# Patient Record
Sex: Male | Born: 1935 | Race: White | Hispanic: No | Marital: Married | Smoking: Never smoker
Health system: Southern US, Community
[De-identification: ages and names within clinical notes are randomized; demographics above are authoritative.]

## PROBLEM LIST (undated history)

## (undated) DIAGNOSIS — H9193 Unspecified hearing loss, bilateral: Secondary | ICD-10-CM

## (undated) DIAGNOSIS — T7840XA Allergy, unspecified, initial encounter: Secondary | ICD-10-CM

## (undated) DIAGNOSIS — I252 Old myocardial infarction: Secondary | ICD-10-CM

## (undated) DIAGNOSIS — N400 Enlarged prostate without lower urinary tract symptoms: Secondary | ICD-10-CM

## (undated) DIAGNOSIS — E785 Hyperlipidemia, unspecified: Secondary | ICD-10-CM

## (undated) DIAGNOSIS — I10 Essential (primary) hypertension: Secondary | ICD-10-CM

## (undated) DIAGNOSIS — J84112 Idiopathic pulmonary fibrosis: Secondary | ICD-10-CM

## (undated) DIAGNOSIS — H7192 Unspecified cholesteatoma, left ear: Secondary | ICD-10-CM

## (undated) DIAGNOSIS — N529 Male erectile dysfunction, unspecified: Secondary | ICD-10-CM

## (undated) DIAGNOSIS — I2510 Atherosclerotic heart disease of native coronary artery without angina pectoris: Secondary | ICD-10-CM

## (undated) HISTORY — DX: Benign prostatic hyperplasia without lower urinary tract symptoms: N40.0

## (undated) HISTORY — DX: Allergy, unspecified, initial encounter: T78.40XA

## (undated) HISTORY — DX: Essential (primary) hypertension: I10

## (undated) HISTORY — DX: Male erectile dysfunction, unspecified: N52.9

## (undated) HISTORY — DX: Unspecified hearing loss, bilateral: H91.93

## (undated) HISTORY — DX: Idiopathic pulmonary fibrosis: J84.112

## (undated) HISTORY — DX: Atherosclerotic heart disease of native coronary artery without angina pectoris: I25.10

## (undated) HISTORY — DX: Old myocardial infarction: I25.2

## (undated) HISTORY — DX: Unspecified cholesteatoma, left ear: H71.92

## (undated) HISTORY — PX: CLEFT PALATE REPAIR: SUR1165

## (undated) HISTORY — DX: Hyperlipidemia, unspecified: E78.5

---

## 2005-02-14 ENCOUNTER — Ambulatory Visit: Payer: Self-pay | Admitting: Family Medicine

## 2005-02-21 ENCOUNTER — Ambulatory Visit: Payer: Self-pay | Admitting: Family Medicine

## 2005-07-25 DIAGNOSIS — I252 Old myocardial infarction: Secondary | ICD-10-CM

## 2005-07-25 HISTORY — DX: Old myocardial infarction: I25.2

## 2006-01-22 HISTORY — PX: CORONARY ARTERY BYPASS GRAFT: SHX141

## 2006-02-15 ENCOUNTER — Ambulatory Visit: Payer: Self-pay | Admitting: Family Medicine

## 2006-02-15 ENCOUNTER — Encounter: Payer: Self-pay | Admitting: Vascular Surgery

## 2006-02-15 ENCOUNTER — Ambulatory Visit: Payer: Self-pay | Admitting: Internal Medicine

## 2006-02-15 ENCOUNTER — Inpatient Hospital Stay (HOSPITAL_COMMUNITY): Admission: EM | Admit: 2006-02-15 | Discharge: 2006-02-21 | Payer: Self-pay | Admitting: Emergency Medicine

## 2006-03-08 ENCOUNTER — Ambulatory Visit: Payer: Self-pay | Admitting: Internal Medicine

## 2006-03-08 ENCOUNTER — Ambulatory Visit: Payer: Self-pay

## 2006-03-15 ENCOUNTER — Ambulatory Visit: Payer: Self-pay | Admitting: Internal Medicine

## 2006-03-16 ENCOUNTER — Encounter (HOSPITAL_COMMUNITY): Admission: RE | Admit: 2006-03-16 | Discharge: 2006-06-14 | Payer: Self-pay | Admitting: Internal Medicine

## 2006-04-10 ENCOUNTER — Ambulatory Visit: Payer: Self-pay | Admitting: Family Medicine

## 2006-05-02 ENCOUNTER — Ambulatory Visit: Payer: Self-pay | Admitting: Family Medicine

## 2006-05-16 ENCOUNTER — Ambulatory Visit: Payer: Self-pay | Admitting: Family Medicine

## 2006-05-25 ENCOUNTER — Ambulatory Visit: Payer: Self-pay | Admitting: Internal Medicine

## 2006-06-08 ENCOUNTER — Ambulatory Visit: Payer: Self-pay | Admitting: Internal Medicine

## 2006-06-15 ENCOUNTER — Encounter (HOSPITAL_COMMUNITY): Admission: RE | Admit: 2006-06-15 | Discharge: 2006-06-30 | Payer: Self-pay | Admitting: Cardiology

## 2006-09-26 ENCOUNTER — Ambulatory Visit: Payer: Self-pay | Admitting: Internal Medicine

## 2006-09-26 LAB — CONVERTED CEMR LAB
AST: 19 units/L (ref 0–37)
Alkaline Phosphatase: 60 units/L (ref 39–117)
BUN: 24 mg/dL — ABNORMAL HIGH (ref 6–23)
Bilirubin, Direct: 0.2 mg/dL (ref 0.0–0.3)
CO2: 30 meq/L (ref 19–32)
Cholesterol: 109 mg/dL (ref 0–200)
LDL Cholesterol: 58 mg/dL (ref 0–99)
Total CHOL/HDL Ratio: 3.3
Total Protein: 6.4 g/dL (ref 6.0–8.3)

## 2006-11-01 ENCOUNTER — Ambulatory Visit: Payer: Self-pay | Admitting: Family Medicine

## 2006-11-01 LAB — CONVERTED CEMR LAB: PSA: 3.63 ng/mL (ref 0.10–4.00)

## 2006-12-08 ENCOUNTER — Ambulatory Visit: Payer: Self-pay | Admitting: Internal Medicine

## 2006-12-08 LAB — CONVERTED CEMR LAB
ALT: 66 units/L — ABNORMAL HIGH (ref 0–40)
AST: 40 units/L — ABNORMAL HIGH (ref 0–37)
Albumin: 4 g/dL (ref 3.5–5.2)
Alkaline Phosphatase: 58 units/L (ref 39–117)
BUN: 26 mg/dL — ABNORMAL HIGH (ref 6–23)
Bilirubin, Direct: 0.2 mg/dL (ref 0.0–0.3)
Chloride: 104 meq/L (ref 96–112)
Cholesterol: 101 mg/dL (ref 0–200)
Creatinine, Ser: 1.4 mg/dL (ref 0.4–1.5)
GFR calc non Af Amer: 53 mL/min
HDL: 31.6 mg/dL — ABNORMAL LOW (ref >39.0)
Potassium: 4.3 meq/L (ref 3.5–5.1)
Sodium: 140 meq/L (ref 135–145)
Total Bilirubin: 1.4 mg/dL — ABNORMAL HIGH (ref 0.3–1.2)
Total CHOL/HDL Ratio: 3.2
Triglycerides: 76 mg/dL (ref 0–149)

## 2007-01-08 ENCOUNTER — Ambulatory Visit: Payer: Self-pay | Admitting: Internal Medicine

## 2007-01-08 LAB — CONVERTED CEMR LAB
ALT: 33 units/L (ref 0–40)
AST: 25 units/L (ref 0–37)
Alkaline Phosphatase: 52 units/L (ref 39–117)
Total Protein: 6.2 g/dL (ref 6.0–8.3)

## 2007-02-26 ENCOUNTER — Ambulatory Visit: Payer: Self-pay | Admitting: Internal Medicine

## 2007-04-25 ENCOUNTER — Ambulatory Visit: Payer: Self-pay | Admitting: Internal Medicine

## 2007-04-25 LAB — CONVERTED CEMR LAB
AST: 21 units/L (ref 0–37)
Bilirubin, Direct: 0.2 mg/dL (ref 0.0–0.3)
Total CHOL/HDL Ratio: 3.4
Triglycerides: 63 mg/dL (ref 0–149)
VLDL: 13 mg/dL (ref 0–40)

## 2007-07-31 ENCOUNTER — Ambulatory Visit: Payer: Self-pay | Admitting: Family Medicine

## 2007-07-31 DIAGNOSIS — E782 Mixed hyperlipidemia: Secondary | ICD-10-CM

## 2007-07-31 DIAGNOSIS — J309 Allergic rhinitis, unspecified: Secondary | ICD-10-CM | POA: Insufficient documentation

## 2007-07-31 DIAGNOSIS — I10 Essential (primary) hypertension: Secondary | ICD-10-CM

## 2007-07-31 DIAGNOSIS — N400 Enlarged prostate without lower urinary tract symptoms: Secondary | ICD-10-CM

## 2007-07-31 DIAGNOSIS — I2510 Atherosclerotic heart disease of native coronary artery without angina pectoris: Secondary | ICD-10-CM

## 2007-07-31 DIAGNOSIS — Z951 Presence of aortocoronary bypass graft: Secondary | ICD-10-CM | POA: Insufficient documentation

## 2007-07-31 LAB — CONVERTED CEMR LAB
Alkaline Phosphatase: 63 units/L (ref 39–117)
Basophils Absolute: 0 10*3/uL (ref 0.0–0.1)
Calcium: 9.3 mg/dL (ref 8.4–10.5)
GFR calc Af Amer: 60 mL/min
GFR calc non Af Amer: 49 mL/min
Glucose, Bld: 97 mg/dL (ref 70–99)
HCT: 49 % (ref 39.0–52.0)
Monocytes Absolute: 0.8 10*3/uL — ABNORMAL HIGH (ref 0.2–0.7)
Monocytes Relative: 11.3 % — ABNORMAL HIGH (ref 3.0–11.0)
Neutro Abs: 3.9 10*3/uL (ref 1.4–7.7)
PSA: 4.2 ng/mL — ABNORMAL HIGH (ref 0.10–4.00)
RDW: 13.9 % (ref 11.5–14.6)
Sodium: 137 meq/L (ref 135–145)
TSH: 1.61 microintl units/mL (ref 0.35–5.50)
Total Bilirubin: 1.6 mg/dL — ABNORMAL HIGH (ref 0.3–1.2)
Total Protein: 6.7 g/dL (ref 6.0–8.3)

## 2007-08-02 ENCOUNTER — Telehealth: Payer: Self-pay | Admitting: Family Medicine

## 2007-08-13 ENCOUNTER — Telehealth (INDEPENDENT_AMBULATORY_CARE_PROVIDER_SITE_OTHER): Payer: Self-pay

## 2007-09-25 ENCOUNTER — Ambulatory Visit: Payer: Self-pay | Admitting: Internal Medicine

## 2007-09-25 LAB — CONVERTED CEMR LAB
ALT: 29 units/L (ref 0–53)
Alkaline Phosphatase: 53 units/L (ref 39–117)
Bilirubin, Direct: 0.3 mg/dL (ref 0.0–0.3)
CO2: 30 meq/L (ref 19–32)
Chloride: 103 meq/L (ref 96–112)
Cholesterol: 85 mg/dL (ref 0–200)
Creatinine, Ser: 1.3 mg/dL (ref 0.4–1.5)
Glucose, Bld: 96 mg/dL (ref 70–99)
LDL Cholesterol: 43 mg/dL (ref 0–99)
Potassium: 4.4 meq/L (ref 3.5–5.1)
Sodium: 139 meq/L (ref 135–145)
Total Bilirubin: 1.7 mg/dL — ABNORMAL HIGH (ref 0.3–1.2)
Total CHOL/HDL Ratio: 2.5
Triglycerides: 41 mg/dL (ref 0–149)
VLDL: 8 mg/dL (ref 0–40)

## 2007-10-01 ENCOUNTER — Ambulatory Visit: Payer: Self-pay | Admitting: Cardiology

## 2007-12-25 ENCOUNTER — Ambulatory Visit: Payer: Self-pay | Admitting: Internal Medicine

## 2007-12-25 LAB — CONVERTED CEMR LAB
ALT: 27 units/L (ref 0–53)
AST: 26 units/L (ref 0–37)
Albumin: 3.7 g/dL (ref 3.5–5.2)
CO2: 29 meq/L (ref 19–32)
Calcium: 9.3 mg/dL (ref 8.4–10.5)
Chloride: 102 meq/L (ref 96–112)
GFR calc Af Amer: 64 mL/min
Glucose, Bld: 106 mg/dL — ABNORMAL HIGH (ref 70–99)
Total Bilirubin: 1.9 mg/dL — ABNORMAL HIGH (ref 0.3–1.2)

## 2008-07-28 ENCOUNTER — Ambulatory Visit: Payer: Self-pay | Admitting: Internal Medicine

## 2008-07-31 ENCOUNTER — Ambulatory Visit: Payer: Self-pay | Admitting: Family Medicine

## 2008-07-31 LAB — CONVERTED CEMR LAB
Glucose, Urine, Semiquant: NEGATIVE
Nitrite: NEGATIVE
Specific Gravity, Urine: 1.015
Urobilinogen, UA: 0.2
WBC Urine, dipstick: NEGATIVE
pH: 5

## 2008-08-01 ENCOUNTER — Telehealth: Payer: Self-pay | Admitting: Family Medicine

## 2008-08-13 ENCOUNTER — Ambulatory Visit: Payer: Self-pay | Admitting: Internal Medicine

## 2008-08-13 LAB — CONVERTED CEMR LAB
Calcium: 9.1 mg/dL (ref 8.4–10.5)
Creatinine, Ser: 1.4 mg/dL (ref 0.4–1.5)
GFR calc Af Amer: 64 mL/min

## 2008-12-19 ENCOUNTER — Encounter (INDEPENDENT_AMBULATORY_CARE_PROVIDER_SITE_OTHER): Payer: Self-pay | Admitting: *Deleted

## 2009-01-29 ENCOUNTER — Ambulatory Visit: Payer: Self-pay | Admitting: Family Medicine

## 2009-02-17 ENCOUNTER — Ambulatory Visit: Payer: Self-pay | Admitting: Internal Medicine

## 2009-02-17 DIAGNOSIS — R609 Edema, unspecified: Secondary | ICD-10-CM

## 2009-02-18 ENCOUNTER — Ambulatory Visit: Payer: Self-pay | Admitting: Internal Medicine

## 2009-02-18 DIAGNOSIS — E7800 Pure hypercholesterolemia, unspecified: Secondary | ICD-10-CM

## 2009-02-18 LAB — CONVERTED CEMR LAB
ALT: 21 units/L (ref 0–53)
Alkaline Phosphatase: 61 units/L (ref 39–117)
Bilirubin, Direct: 0.3 mg/dL (ref 0.0–0.3)
Chloride: 105 meq/L (ref 96–112)
Cholesterol: 89 mg/dL (ref 0–200)
HDL: 30.6 mg/dL — ABNORMAL LOW (ref >39.00)
Total Bilirubin: 2 mg/dL — ABNORMAL HIGH (ref 0.3–1.2)
Total CHOL/HDL Ratio: 3

## 2009-02-19 ENCOUNTER — Encounter: Payer: Self-pay | Admitting: Internal Medicine

## 2009-09-10 ENCOUNTER — Ambulatory Visit: Payer: Self-pay | Admitting: Family Medicine

## 2009-09-10 LAB — CONVERTED CEMR LAB
AST: 20 units/L (ref 0–37)
Albumin: 3.8 g/dL (ref 3.5–5.2)
Alkaline Phosphatase: 59 units/L (ref 39–117)
Basophils Relative: 0.1 % (ref 0.0–3.0)
Bilirubin, Direct: 0.2 mg/dL (ref 0.0–0.3)
Blood in Urine, dipstick: NEGATIVE
CO2: 32 meq/L (ref 19–32)
Chloride: 106 meq/L (ref 96–112)
Glucose, Bld: 90 mg/dL (ref 70–99)
Glucose, Urine, Semiquant: NEGATIVE
HDL: 35.2 mg/dL — ABNORMAL LOW (ref >39.00)
Ketones, urine, test strip: NEGATIVE
Lymphs Abs: 1.5 10*3/uL (ref 0.7–4.0)
MCV: 93.5 fL (ref 78.0–100.0)
Neutrophils Relative %: 63.2 % (ref 43.0–77.0)
Nitrite: NEGATIVE
Platelets: 177 10*3/uL (ref 150.0–400.0)
Potassium: 4.9 meq/L (ref 3.5–5.1)
RBC: 4.88 M/uL (ref 4.22–5.81)
RDW: 13.3 % (ref 11.5–14.6)
Sodium: 143 meq/L (ref 135–145)
Total Bilirubin: 2.2 mg/dL — ABNORMAL HIGH (ref 0.3–1.2)
Triglycerides: 147 mg/dL (ref 0.0–149.0)

## 2009-09-14 LAB — CONVERTED CEMR LAB
AST: 25 units/L (ref 0–37)
Alkaline Phosphatase: 63 units/L (ref 39–117)
Basophils Absolute: 0 10*3/uL (ref 0.0–0.1)
Basophils Relative: 0.6 % (ref 0.0–3.0)
Bilirubin, Direct: 0.3 mg/dL (ref 0.0–0.3)
CO2: 31 meq/L (ref 19–32)
Cholesterol: 137 mg/dL (ref 0–200)
Eosinophils Absolute: 0.3 10*3/uL (ref 0.0–0.7)
GFR calc Af Amer: 55 mL/min
GFR calc non Af Amer: 46 mL/min
Glucose, Bld: 100 mg/dL — ABNORMAL HIGH (ref 70–99)
HCT: 47.1 % (ref 39.0–52.0)
HDL: 34.4 mg/dL — ABNORMAL LOW (ref >39.0)
LDL Cholesterol: 83 mg/dL (ref 0–99)
MCV: 92.2 fL (ref 78.0–100.0)
Monocytes Relative: 9.8 % (ref 3.0–12.0)
Neutrophils Relative %: 62.9 % (ref 43.0–77.0)
Platelets: 198 10*3/uL (ref 150–400)
Potassium: 4.4 meq/L (ref 3.5–5.1)
RBC: 5.1 M/uL (ref 4.22–5.81)
RDW: 12.7 % (ref 11.5–14.6)
Sodium: 142 meq/L (ref 135–145)
Total Protein: 7.1 g/dL (ref 6.0–8.3)
Triglycerides: 99 mg/dL (ref 0–149)

## 2009-10-27 ENCOUNTER — Ambulatory Visit: Payer: Self-pay | Admitting: Internal Medicine

## 2010-07-29 ENCOUNTER — Ambulatory Visit
Admission: RE | Admit: 2010-07-29 | Discharge: 2010-07-29 | Payer: Self-pay | Source: Home / Self Care | Attending: Internal Medicine | Admitting: Internal Medicine

## 2010-07-29 ENCOUNTER — Encounter: Payer: Self-pay | Admitting: Internal Medicine

## 2010-07-29 DIAGNOSIS — M79609 Pain in unspecified limb: Secondary | ICD-10-CM | POA: Insufficient documentation

## 2010-08-16 ENCOUNTER — Ambulatory Visit
Admission: RE | Admit: 2010-08-16 | Discharge: 2010-08-16 | Payer: Self-pay | Source: Home / Self Care | Attending: Internal Medicine | Admitting: Internal Medicine

## 2010-08-25 NOTE — Assessment & Plan Note (Signed)
Summary: f106m      Allergies Added: NKDA  Visit Type:  Follow-up Primary Provider:  Roderick Pee MD  CC:  no complaints.  History of Present Illness: Ian Hale is a delightful 76 year old male with history of coronary artery disease, status post non-ST-elevation myocardial infarction in July 2007, followed by bypass surgery.  He also has a history of hypertension, hyperlipidemia with low HDL, and history of cleft palate status post repair.  He returns today for routine followup.   He is doing very well. Active without chest pain or SOB. Walking almost every day for 30-35 mins/day. LE edema much improved with addition of  No orthopnea or PND. Has lost 17 pounds trough diet and exercise.  Now back up to 2000 mg per day of Niacin. Most recent cholesterol panel in January LDL 60 HDL 35.   Current Medications (verified): 1)  Potassium Chloride 20 Meq  Pack (Potassium Chloride) .... Once Daily 2)  Hydrochlorothiazide 25 Mg  Tabs (Hydrochlorothiazide) .... Take 1 Tablet By Mouth Every Morning ; Cpx or Ov When Due 3)  Lipitor 80 Mg  Tabs (Atorvastatin Calcium) .... Take 1 Tab By Mouth At Bedtime 4)  Adult Aspirin Low Strength 81 Mg  Tbdp (Aspirin) 5)  Metoprolol Tartrate 25 Mg Tabs (Metoprolol Tartrate) .... Take One Tablet By Mouth Twice A Day 6)  Lumigan 0.03 %  Soln (Bimatoprost) 7)  Nitrostat 0.4 Mg  Subl (Nitroglycerin) .... Prn 8)  Saw Palmetto   Extr (Saw Palmetto) 9)  Lisinopril 10 Mg Tabs (Lisinopril) .... Take One Tab Once Daily 10)  Furosemide 20 Mg Tabs (Furosemide) .... Take One Tablet Once Daily 11)  Cialis 20 Mg Tabs (Tadalafil) .... Uad 12)  Niaspan 1000 Mg Cr-Tabs (Niacin (Antihyperlipidemic)) .... Take One Tab Two Times A Day  Allergies (verified): No Known Drug Allergies  Past History:  Past Medical History: Last updated: 07/31/2007 Allergic rhinitis Coronary artery disease Hyperlipidemia Hypertension Benign prostatic hypertrophy cleft palate with  repair bilateral hearing loss glaucoma cholesteatoma left ear coronary bypass surgery 2007 erectile dysfunction  Review of Systems       As per HPI and past medical history; otherwise all systems negative.   Vital Signs:  Patient profile:   76 year old male Height:      68.75 inches Weight:      206 pounds BMI:     30.75 Pulse rate:   53 / minute BP sitting:   130 / 66  (left arm) Cuff size:   regular  Vitals Entered By: Hardin Negus, RMA (October 27, 2009 3:58 PM)  Physical Exam  General:  Gen: well appearing. no resp difficulty HEENT: normal. s/p cleft plate repair Neck: supple. no JVD. Carotids 2+ bilat; no bruits.  Cor: PMI nondisplaced. Regular rate & rhythm. No rubs, gallops, murmur. Lungs: clear Abdomen: soft, nontender, nondistended. No hepatosplenomegaly. No bruits or masses. Good bowel sounds. Extremities: no cyanosis, clubbing, rash, tr-1+ edema L > R Neuro: alert & orientedx3, cranial nerves grossly intact. moves all 4 extremities w/o difficulty. affect pleasant    Impression & Recommendations:  Problem # 1:  CORONARY ARTERY DISEASE (ICD-414.00) Stable. No evidence of ischemia. Continue current regimen.  Problem # 2:  HYPERTENSION (ICD-401.9) Blood pressure well controlled. Continue current regimen.  Problem # 3:  PURE HYPERCHOLESTEROLEMIA (ICD-272.0) LDL looks good. HDL remains low. We will double fish oil.   Other Orders: EKG w/ Interpretation (93000)  Patient Instructions: 1)  Increase Fish Oil 2)  Follow up  in 9 months

## 2010-08-25 NOTE — Assessment & Plan Note (Signed)
Summary: PT WILL COME IN FASTING (CPX) // RS---PT RSC (BMP) // RS   Vital Signs:  Patient profile:   76 year old male Height:      68.75 inches Weight:      223 pounds Temp:     97.8 degrees F oral BP sitting:   110 / 70  (left arm) Cuff size:   regular  Vitals Entered By: Kern Reap CMA Duncan Dull) (September 10, 2009 9:37 AM)  Reason for Visit cpx  Primary Care Provider:  Roderick Pee MD   History of Present Illness: Ian Hale is a 76 year old, married male, nonsmoker comes in today for evaluation of multiple issues.  He has a history of underlying hypertension, for which he takes hctz  is on 25 mg daily, potassium 20 mEq daily, lisinopril, 10 mg daily, Lasix 20 mg daily.  BP 110/70.  He has hyperlipidemia for which he takes Lipitor 80 mg nightly will check lipid panel. he has a  history of underlying coronary disease.  He presented about 5 years ago with shoulder pain.  Exam was negative.  EKG was nondiagnostic.  However, because of the symptoms.  He was referred to cardiology.  Angiogram showed 3 vessel disease.,,,, subsequent surgery...Marland KitchenMarland KitchenMarland Kitchen  These done well with no recurrence of his shoulder pain.  He takes a beta-blocker 25 mg b.i.d.  He also has erectile dysfunction.  He uses Cialis 20 mg p.r.n.  He takes an aspirin tablet daily, and OTC salt palmetto for mild BPH.  routine  eyecare dental care.  Colonoscopy normal in  GI.  Tetanus 2003, Pneumovax 2010, shingles 2070forgot to get his flu shot this year.  EKG done in July by Dr. Jesusita Oka B. unchanged.  Allergies: No Known Drug Allergies  Past History:  Past medical, surgical, family and social histories (including risk factors) reviewed, and no changes noted (except as noted below).  Past Medical History: Reviewed history from 07/31/2007 and no changes required. Allergic rhinitis Coronary artery disease Hyperlipidemia Hypertension Benign prostatic hypertrophy cleft palate with repair bilateral hearing  loss glaucoma cholesteatoma left ear coronary bypass surgery 2007 erectile dysfunction  Family History: Reviewed history from 07/31/2007 and no changes required. father died A. congestive heart failure, diabetic and a smoker.  Mom died 81, congestive heart failure.  One sister in good health  Social History: Reviewed history from 07/31/2007 and no changes required. Retired Married Never Smoked Alcohol use-no Drug use-no Regular exercise-yes  Review of Systems      See HPI  Physical Exam  General:  Well-developed,well-nourished,in no acute distress; alert,appropriate and cooperative throughout examination Head:  Normocephalic and atraumatic without obvious abnormalities. No apparent alopecia or balding. Eyes:  No corneal or conjunctival inflammation noted. EOMI. Perrla. Funduscopic exam benign, without hemorrhages, exudates or papilledema. Vision grossly normal. Ears:  External ear exam shows no significant lesions or deformities.  Otoscopic examination reveals clear canals, tympanic membranes are intact bilaterally without bulging, retraction, inflammation or discharge. Hearing is grossly normal bilaterally. Nose:  External nasal examination shows no deformity or inflammation. Nasal mucosa are pink and moist without lesions or exudates. Mouth:  dental appliance is secondary to surgery cleft palate Neck:  No deformities, masses, or tenderness noted. Chest Wall:  No deformities, masses, tenderness or gynecomastia noted. Breasts:  No masses or gynecomastia noted Lungs:  Normal respiratory effort, chest expands symmetrically. Lungs are clear to auscultation, no crackles or wheezes. Heart:  Normal rate and regular rhythm. S1 and S2 normal without gallop, murmur, click, rub or other  extra sounds. Abdomen:  Bowel sounds positive,abdomen soft and non-tender without masses, organomegaly or hernias noted. Rectal:  No external abnormalities noted. Normal sphincter tone. No rectal masses or  tenderness. Genitalia:  Testes bilaterally descended without nodularity, tenderness or masses. No scrotal masses or lesions. No penis lesions or urethral discharge. Prostate:  Prostate gland firm and smooth, no enlargement, nodularity, tenderness, mass, asymmetry or induration. Msk:  No deformity or scoliosis noted of thoracic or lumbar spine.   Pulses:  R and L carotid,radial,femoral,dorsalis pedis and posterior tibial pulses are full and equal bilaterally Extremities:  No clubbing, cyanosis, edema, or deformity noted with normal full range of motion of all joints.   Neurologic:  No cranial nerve deficits noted. Station and gait are normal. Plantar reflexes are down-going bilaterally. DTRs are symmetrical throughout. Sensory, motor and coordinative functions appear intact. Skin:  Intact without suspicious lesions or rashes Cervical Nodes:  No lymphadenopathy noted Axillary Nodes:  No palpable lymphadenopathy Inguinal Nodes:  No significant adenopathy Psych:  Cognition and judgment appear intact. Alert and cooperative with normal attention span and concentration. No apparent delusions, illusions, hallucinations   Impression & Recommendations:  Problem # 1:  PURE HYPERCHOLESTEROLEMIA (ICD-272.0) Assessment Improved  The following medications were removed from the medication list:    Niaspan 1000 Mg Cr-tabs (Niacin (antihyperlipidemic)) .Marland Kitchen... Take one tablet by mouth once daily. His updated medication list for this problem includes:    Lipitor 80 Mg Tabs (Atorvastatin calcium) .Marland Kitchen... Take 1 tab by mouth at bedtime    Niaspan 1000 Mg Cr-tabs (Niacin (antihyperlipidemic)) .Marland Kitchen... Take one tab two times a day  Problem # 2:  EDEMA (ICD-782.3) Assessment: Improved  His updated medication list for this problem includes:    Hydrochlorothiazide 25 Mg Tabs (Hydrochlorothiazide) .Marland Kitchen... Take 1 tablet by mouth every morning ; cpx or ov when due    Furosemide 20 Mg Tabs (Furosemide) .Marland Kitchen... Take one tablet  once daily  Orders: Venipuncture (21308) Prescription Created Electronically [phone]) UA Dipstick w/o Micro (automated)  (81003) TLB-Lipid Panel (80061-LIPID) TLB-BMP (Basic Metabolic Panel-BMET) (80048-METABOL) TLB-CBC Platelet - w/Differential (85025-CBCD) TLB-Hepatic/Liver Function Pnl (80076-HEPATIC) TLB-TSH (Thyroid Stimulating Hormone) (84443-TSH) TLB-PSA (Prostate Specific Antigen) (84153-PSA)  Problem # 3:  BENIGN PROSTATIC HYPERTROPHY (ICD-600.00) Assessment: Unchanged  Orders: Venipuncture (69629) TLB-Lipid Panel (80061-LIPID) TLB-BMP (Basic Metabolic Panel-BMET) (80048-METABOL) TLB-CBC Platelet - w/Differential (85025-CBCD) TLB-Hepatic/Liver Function Pnl (80076-HEPATIC) TLB-TSH (Thyroid Stimulating Hormone) (84443-TSH) TLB-PSA (Prostate Specific Antigen) (84153-PSA)  Problem # 4:  HYPERTENSION (ICD-401.9) Assessment: Improved  His updated medication list for this problem includes:    Hydrochlorothiazide 25 Mg Tabs (Hydrochlorothiazide) .Marland Kitchen... Take 1 tablet by mouth every morning ; cpx or ov when due    Metoprolol Tartrate 25 Mg Tabs (Metoprolol tartrate) .Marland Kitchen... Take one tablet by mouth twice a day    Lisinopril 10 Mg Tabs (Lisinopril) .Marland Kitchen... Take one tab once daily    Furosemide 20 Mg Tabs (Furosemide) .Marland Kitchen... Take one tablet once daily  Orders: Venipuncture (52841) Prescription Created Electronically [phone]) UA Dipstick w/o Micro (automated)  (81003) TLB-Lipid Panel (80061-LIPID) TLB-BMP (Basic Metabolic Panel-BMET) (80048-METABOL) TLB-CBC Platelet - w/Differential (85025-CBCD) TLB-Hepatic/Liver Function Pnl (80076-HEPATIC) TLB-TSH (Thyroid Stimulating Hormone) (84443-TSH) TLB-PSA (Prostate Specific Antigen) (84153-PSA)  Complete Medication List: 1)  Potassium Chloride 20 Meq Pack (Potassium chloride) .... Once daily 2)  Hydrochlorothiazide 25 Mg Tabs (Hydrochlorothiazide) .... Take 1 tablet by mouth every morning ; cpx or ov when due 3)  Lipitor 80 Mg Tabs  (Atorvastatin calcium) .... Take 1 tab by mouth at bedtime 4)  Adult Aspirin Low Strength 81 Mg Tbdp (Aspirin) 5)  Metoprolol Tartrate 25 Mg Tabs (Metoprolol tartrate) .... Take one tablet by mouth twice a day 6)  Lumigan 0.03 % Soln (Bimatoprost) 7)  Nitrostat 0.4 Mg Subl (Nitroglycerin) .... Prn 8)  Saw Palmetto Extr (Saw palmetto) 9)  Lisinopril 10 Mg Tabs (Lisinopril) .... Take one tab once daily 10)  Furosemide 20 Mg Tabs (Furosemide) .... Take one tablet once daily 11)  Cialis 20 Mg Tabs (Tadalafil) .... Uad 12)  Niaspan 1000 Mg Cr-tabs (Niacin (antihyperlipidemic)) .... Take one tab two times a day  Patient Instructions: 1)  Please schedule a follow-up appointment in 1 year. 2)  It is important that you exercise regularly at least 20 minutes 5 times a week. If you develop chest pain, have severe difficulty breathing, or feel very tired , stop exercising immediately and seek medical attention. 3)  Take an Aspirin every day. 4)  Check your Blood Pressure regularly. If it is above: you should make an appointment. Prescriptions: NIASPAN 1000 MG CR-TABS (NIACIN (ANTIHYPERLIPIDEMIC)) take one tab two times a day  #200 x 0   Entered and Authorized by:   Roderick Pee MD   Signed by:   Roderick Pee MD on 09/10/2009   Method used:   Electronically to        [facility] [address] [phone]* (retail)       [address]       [place], [place]  [address]       Ph: [phone] or [phone]       Fax: [phone]   RxID:   [id number] CIALIS 20 MG TABS (TADALAFIL) UAD  #6 x 3   Entered and Authorized by:   Roderick Pee MD   Signed by:   Roderick Pee MD on 09/10/2009   Method used:   Electronically to        [facility] [address] [phone]* (retail)       [address]       [place], [place]  [address]       Ph: [phone] or [phone]       Fax: [phone]   RxID:   [id number] FUROSEMIDE 20 MG TABS (FUROSEMIDE) take one tablet once  daily  #100 x 3   Entered and Authorized by:   Roderick Pee MD   Signed by:   Roderick Pee MD on 09/10/2009   Method used:   Electronically to        [facility] [address] [phone]* (retail)       [address]       [place], [place]  [address]       Ph: [phone] or [phone]       Fax: [phone]   RxID:   [id number] LISINOPRIL 10 MG TABS (LISINOPRIL) take one tab once daily  #100 x 3   Entered and Authorized by:   Roderick Pee MD   Signed by:   Roderick Pee MD on 09/10/2009   Method used:   Electronically to        [facility] [address] [phone]* (retail)       [address]       [place], [place]  [address]  Ph: [phone] or [phone]       Fax: [phone]   RxID:   [id number] NITROSTAT 0.4 MG  SUBL (NITROGLYCERIN) prn  #25 x 1   Entered and Authorized by:   Roderick Pee MD   Signed by:   Roderick Pee MD on 09/10/2009   Method used:   Electronically to        [facility] [address] [phone]* (retail)       [address]       [place], [place]  [address]       Ph: [phone] or [phone]       Fax: [phone]   RxID:   [id number] METOPROLOL TARTRATE 25 MG TABS (METOPROLOL TARTRATE) Take one tablet by mouth twice a day  #200 x 3   Entered and Authorized by:   Roderick Pee MD   Signed by:   Roderick Pee MD on 09/10/2009   Method used:   Electronically to        [facility] [address] [phone]* (retail)       [address]       [place], [place]  [address]       Ph: [phone] or [phone]       Fax: [phone]   RxID:   [id number] LIPITOR 80 MG  TABS (ATORVASTATIN CALCIUM) Take 1 tab by mouth at bedtime  #100 x 3   Entered and Authorized by:   Roderick Pee MD   Signed by:   Roderick Pee MD on 09/10/2009   Method used:   Electronically to        [facility] [address] [phone]* (retail)       [address]       [place], [place]  [address]       Ph: [phone] or [phone]       Fax: [phone]   RxID:   [id number] HYDROCHLOROTHIAZIDE 25 MG  TABS (HYDROCHLOROTHIAZIDE) Take 1 tablet by mouth every morning ; CPX OR OV WHEN DUE  #100 x 3   Entered and Authorized by:   Roderick Pee MD   Signed by:   Roderick Pee MD on 09/10/2009   Method used:   Electronically to        [facility] [address] [phone]* (retail)       [address]       [place], [place]  [address]       Ph: [phone] or [phone]       Fax: [phone]   RxID:   [id number] POTASSIUM CHLORIDE 20 MEQ  PACK (POTASSIUM CHLORIDE) once daily  #100 x 3   Entered and Authorized by:   Roderick Pee MD   Signed by:   Roderick Pee MD on 09/10/2009   Method used:   Electronically to        [facility] [address] [phone]* (retail)       [address]       [place], [place]  [address]       Ph: [phone] or [phone]       Fax: [phone]   RxID:   [id number]  Laboratory Results   Urine Tests    Routine Urinalysis   Color: yellow Appearance: Clear Glucose: negative   (Normal Range: Negative) Bilirubin: negative   (Normal Range: Negative) Ketone: negative   (Normal Range: Negative) Spec. Gravity: 1.020   (Normal Range: 1.003-1.035) Blood: negative   (Normal Range: Negative) pH: 7.0   (Normal Range: 5.0-8.0) Protein: negative   (Normal Range: Negative) Urobilinogen: 0.2   (Normal Range: 0-1) Nitrite: negative   (Normal Range: Negative) Leukocyte Esterace: negative   (Normal Range: Negative)    Comments: Rita Ohara  September 10, 2009 11:48 AM

## 2010-08-26 NOTE — Assessment & Plan Note (Signed)
Summary: f53m      Allergies Added: NKDA  Visit Type:  Follow-up Primary Provider:  Roderick Pee MD   History of Present Illness: Ian Hale is a delightful 76 year old male with history of coronary artery disease, status post non-ST-elevation myocardial infarction in July 2007, followed by bypass surgery.  He also has a history of hypertension, hyperlipidemia with low HDL, and history of cleft palate status post repair.  He returns today for routine followup.   He is doing very well. Was very active last year and lost a bunch of weight but now busier at work so not doing as much. Now only walking 3 days/wk for 1 hour at a time. (Previously was 7 days per week). No CP or SOB. Occasionally gets heavy feeling or [place] in L arm. No associated symptoms. Lasts few minutes and resolves.  Due for lipids with Dr. Tawanna Cooler next month. Compliant with all meds.     Current Medications (verified): 1)  Potassium Chloride 20 Meq  Pack (Potassium Chloride) .... Once Daily 2)  Hydrochlorothiazide 25 Mg  Tabs (Hydrochlorothiazide) .... Take 1 Tablet By Mouth Every Morning ; Cpx or Ov When Due 3)  Lipitor 80 Mg  Tabs (Atorvastatin Calcium) .... Take 1 Tab By Mouth At Bedtime 4)  Adult Aspirin Low Strength 81 Mg  Tbdp (Aspirin) 5)  Metoprolol Tartrate 25 Mg Tabs (Metoprolol Tartrate) .... Take One Tablet By Mouth Twice A Day 6)  Lumigan 0.03 %  Soln (Bimatoprost) 7)  Nitrostat 0.4 Mg  Subl (Nitroglycerin) .... Prn 8)  Saw Palmetto   Extr (Saw Palmetto) 9)  Lisinopril 10 Mg Tabs (Lisinopril) .... Take One Tab Once Daily 10)  Furosemide 20 Mg Tabs (Furosemide) .... Take One Tablet Once Daily 11)  Cialis 20 Mg Tabs (Tadalafil) .... Uad 12)  Niaspan 1000 Mg Cr-Tabs (Niacin (Antihyperlipidemic)) .... Take One Tab Two Times A Day  Allergies (verified): No Known Drug Allergies  Past History:  Past Medical History: Allergic rhinitis Coronary artery disease s/p CABG  2007 Hyperlipidemia Hypertension Benign prostatic hypertrophy cleft palate with repair bilateral hearing loss glaucoma cholesteatoma left ear erectile dysfunction  Review of Systems       As per HPI and past medical history; otherwise all systems negative.   Vital Signs:  Patient profile:   75 year old male Height:      68.75 inches Weight:      215 pounds BMI:     32.10 Pulse rate:   68 / minute Pulse rhythm:   regular BP sitting:   120 / 70  (right arm)  Physical Exam  General:  Gen: well appearing. no resp difficulty HEENT: normal. s/p cleft plate repair Neck: supple. no JVD. Carotids 2+ bilat; no bruits.  Cor: PMI nondisplaced. Regular rate & rhythm. No rubs, gallops, murmur. Lungs: clear Abdomen: soft, nontender, nondistended. No hepatosplenomegaly. No bruits or masses. Good bowel sounds. Extremities: no cyanosis, clubbing, rash, tr-1+ edema L > R Neuro: alert & orientedx3, cranial nerves grossly intact. moves all 4 extremities w/o difficulty. affect pleasant    Impression & Recommendations:  Problem # 1:  CORONARY ARTERY DISEASE (ICD-414.00) Overall doing well. I doubt L arm pain is anginal equivalent but given 5 yrs out from CABG will schedule routine ETT to f/u.   EKG w/ Interpretation (93000)  Problem # 2:  HYPERTENSION (ICD-401.9) Blood pressure well controlled. Continue current regimen.  Problem # 3:  EDEMA (ICD-782.3)  Stable. Continue current regimen.   Orders: Treadmill (Treadmill)  Problem # 4:  HYPERLIPIDEMIA (ICD-272.4) Followed by Dr. Tawanna Cooler. Goal LDL < 70. If LDL > 70. Consider changing to crestor 40.  Problem # 5:  Overweight Discussed neded to resume his diet and exercise efforts to keep his weight down.   CHF Assessment/Plan:      The patient's current weight is 215 pounds.  His previous weight was 206 pounds.     Patient Instructions: 1)  Your physician recommends that you schedule a follow-up appointment in: 9 months with Dr.  Gala Romney 2)  Your physician recommends that you continue on your current medications as directed. Please refer to the Current Medication list given to you today. 3)  Your physician has requested that you have an exercise tolerance test.  For further information please visit [email or web address]  Please also follow instruction sheet, as given. WITH DR. Gala Romney

## 2010-09-14 ENCOUNTER — Other Ambulatory Visit: Payer: PRIVATE HEALTH INSURANCE | Admitting: Family Medicine

## 2010-09-14 DIAGNOSIS — Z125 Encounter for screening for malignant neoplasm of prostate: Secondary | ICD-10-CM

## 2010-09-14 DIAGNOSIS — I10 Essential (primary) hypertension: Secondary | ICD-10-CM

## 2010-09-14 DIAGNOSIS — Z0000 Encounter for general adult medical examination without abnormal findings: Secondary | ICD-10-CM

## 2010-09-14 DIAGNOSIS — E785 Hyperlipidemia, unspecified: Secondary | ICD-10-CM

## 2010-09-14 LAB — CBC WITH DIFFERENTIAL/PLATELET
Basophils Absolute: 0 10*3/uL (ref 0.0–0.1)
Eosinophils Absolute: 0.4 10*3/uL (ref 0.0–0.7)
Lymphocytes Relative: 20.8 % (ref 12.0–46.0)
Lymphs Abs: 1.4 10*3/uL (ref 0.7–4.0)
Monocytes Relative: 10 % (ref 3.0–12.0)
Neutrophils Relative %: 62.9 % (ref 43.0–77.0)
WBC: 7 10*3/uL (ref 4.5–10.5)

## 2010-09-14 LAB — HEPATIC FUNCTION PANEL
AST: 22 U/L (ref 0–37)
Bilirubin, Direct: 0.2 mg/dL (ref 0.0–0.3)
Total Bilirubin: 1.2 mg/dL (ref 0.3–1.2)
Total Protein: 6.5 g/dL (ref 6.0–8.3)

## 2010-09-14 LAB — POCT URINALYSIS DIPSTICK
Bilirubin, UA: NEGATIVE
Blood, UA: NEGATIVE
Ketones, UA: NEGATIVE
Leukocytes, UA: NEGATIVE
Protein, UA: NEGATIVE
Spec Grav, UA: 1.01

## 2010-09-14 LAB — LIPID PANEL
Cholesterol: 144 mg/dL (ref 0–200)
Triglycerides: 88 mg/dL (ref 0.0–149.0)

## 2010-09-14 LAB — BASIC METABOLIC PANEL
Calcium: 9.2 mg/dL (ref 8.4–10.5)
Chloride: 101 mEq/L (ref 96–112)
Potassium: 4.8 mEq/L (ref 3.5–5.1)
Sodium: 139 mEq/L (ref 135–145)

## 2010-09-14 LAB — PSA: PSA: 5.1 ng/mL — ABNORMAL HIGH (ref 0.10–4.00)

## 2010-09-17 ENCOUNTER — Other Ambulatory Visit: Payer: Self-pay | Admitting: Family Medicine

## 2010-09-21 ENCOUNTER — Encounter: Payer: Self-pay | Admitting: Family Medicine

## 2010-09-21 ENCOUNTER — Ambulatory Visit (INDEPENDENT_AMBULATORY_CARE_PROVIDER_SITE_OTHER): Payer: PRIVATE HEALTH INSURANCE | Admitting: Family Medicine

## 2010-09-21 DIAGNOSIS — E7800 Pure hypercholesterolemia, unspecified: Secondary | ICD-10-CM

## 2010-09-21 DIAGNOSIS — N400 Enlarged prostate without lower urinary tract symptoms: Secondary | ICD-10-CM

## 2010-09-21 DIAGNOSIS — I10 Essential (primary) hypertension: Secondary | ICD-10-CM

## 2010-09-21 DIAGNOSIS — J309 Allergic rhinitis, unspecified: Secondary | ICD-10-CM

## 2010-09-21 DIAGNOSIS — N529 Male erectile dysfunction, unspecified: Secondary | ICD-10-CM

## 2010-09-21 DIAGNOSIS — R609 Edema, unspecified: Secondary | ICD-10-CM

## 2010-09-21 DIAGNOSIS — I2510 Atherosclerotic heart disease of native coronary artery without angina pectoris: Secondary | ICD-10-CM

## 2010-09-21 MED ORDER — NITROGLYCERIN 0.4 MG SL SUBL: 0.4000 mg | SUBLINGUAL_TABLET | SUBLINGUAL | Status: DC | PRN

## 2010-09-21 MED ORDER — ATORVASTATIN CALCIUM 80 MG PO TABS: 80.0000 mg | ORAL_TABLET | Freq: Every day | ORAL | Status: DC

## 2010-09-21 MED ORDER — POTASSIUM CHLORIDE CRYS ER 20 MEQ PO TBCR: 20.0000 meq | EXTENDED_RELEASE_TABLET | Freq: Every day | ORAL | Status: DC

## 2010-09-21 MED ORDER — HYDROCHLOROTHIAZIDE 25 MG PO TABS: 25.0000 mg | ORAL_TABLET | Freq: Every day | ORAL | Status: DC

## 2010-09-21 MED ORDER — LISINOPRIL 10 MG PO TABS: 10.0000 mg | ORAL_TABLET | Freq: Every day | ORAL | Status: DC

## 2010-09-21 MED ORDER — METOPROLOL TARTRATE 25 MG PO TABS: ORAL_TABLET | ORAL | Status: DC

## 2010-09-21 MED ORDER — FUROSEMIDE 20 MG PO TABS: 20.0000 mg | ORAL_TABLET | Freq: Every day | ORAL | Status: DC

## 2010-09-21 MED ORDER — TADALAFIL 20 MG PO TABS: 20.0000 mg | ORAL_TABLET | Freq: Every day | ORAL | Status: DC | PRN

## 2010-09-21 NOTE — Patient Instructions (Signed)
Continue current medications except take 12.5 mg of the beta-blocker twice daily to allow your blood pressure did come up to 130/80.  That range.  Return one year, sooner if any problems

## 2010-09-21 NOTE — Progress Notes (Signed)
  Subjective:    Patient ID: Ian Hale, male    DOB: 01-11-1935, 76 y.o.   MRN: [id number]  [name] is a delightful, 76 year old, married male, nonsmoker, who comes in today for general physical exam because of a history of erectile dysfunction.  Venous insufficiency, hyperlipidemia, hypertension, and coronary artery disease, status post bypass surgery 2007.  He also has BPH.All his problems and medications were reviewed in detail.  There had been noted.  Changes.  He sees cardiology on a yearly basis since he had bypass surgery in 2007.  Review of systems negative except for lightheaded when he stands up.  BP today 104/64, pulse 60 and regular.  Recommend he cut his beta blocker from 25 b.i.d. To 12.5 b.i.d.  He continues to work one day a week at [employer], but mostly takes care of his 74 year old wife is elderly sick with pneumonia and has multiple medical problems      Review of Systems  Constitutional: Negative.   HENT: Negative.   Eyes: Negative.   Respiratory: Negative.   Cardiovascular: Negative.   Gastrointestinal: Negative.   Genitourinary: Negative.   Musculoskeletal: Negative.   Skin: Negative.   Neurological: Negative.   Hematological: Negative.   Psychiatric/Behavioral: Negative.        Objective:   Physical Exam  Constitutional: He is oriented to person, place, and time. He appears well-developed and well-nourished.  HENT:  Head: Normocephalic and atraumatic.  Right Ear: External ear normal.  Left Ear: External ear normal.  Nose: Nose normal.  Mouth/Throat: Oropharynx is clear and moist.       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 Pupils are equal, round, and reactive to light.  Neck: Normal range of motion. Neck supple. No JVD present. No tracheal deviation present. No thyromegaly present.  Cardiovascular: Normal rate, regular rhythm, normal heart sounds and intact distal pulses.  Exam  reveals no gallop and no friction rub.   No murmur heard. Pulmonary/Chest: Effort normal and breath sounds normal. No stridor. No respiratory distress. He has no wheezes. He has no rales. He exhibits no tenderness.  Abdominal: Soft. Bowel sounds are normal. He exhibits no distension and no mass. There is no tenderness. There is no rebound and no guarding.  Genitourinary: Rectum normal and penis normal. Guaiac negative stool. No penile tenderness.       Symmetrical 1+ BPH  Musculoskeletal: Normal range of motion. He exhibits no edema and no tenderness.  Lymphadenopathy:    He has no cervical adenopathy.  Neurological: He is alert and oriented to person, place, and time. He has normal reflexes. No cranial nerve deficit. He exhibits normal muscle tone.  Skin: Skin is warm and dry. No rash noted. No erythema. No pallor.  Psychiatric: He has a normal mood and affect. His behavior is normal. Judgment and thought content normal.          Assessment & Plan:  Hypertension continue the Lasix 20 daily, and hydrochlorothiazide 25 daily, lisinopril 10 daily, but decrease the beta blocker 12.5 b.i.d.  Erectile dysfunction continue Cialis p.r.n.  Hyperlipidemia.  Continue Lipitor 80 nightly  Coronary disease, asymptomatic.  Follow-up in cardiology.  The pH, asymptomatic.  Follow-up p.r.n.

## 2010-11-19 ENCOUNTER — Other Ambulatory Visit: Payer: Self-pay | Admitting: Family Medicine

## 2010-11-26 ENCOUNTER — Other Ambulatory Visit: Payer: Self-pay | Admitting: Family Medicine

## 2010-12-07 NOTE — Assessment & Plan Note (Signed)
[facility]                            CARDIOLOGY OFFICE NOTE   NAME:Ian Hale, Ian Hale                        MRN:          [id number]  DATE:02/26/2007                            DOB:          05-Apr-1935    INTERVAL HISTORY:  Ian Hale is a very pleasant 76 year old male with a  history of coronary artery disease status post non-ST elevation  myocardial infarction in July of 2007, followed by a bypass surgery and  also has a history of hypertension, hyperlipidemia with a low HDL and a  history of cleft palate status post repair. He returns today for routine  followup.   He is doing well. He denies any chest pain or shortness of breath. His  wife is concerned that he is over exerting himself. He does not feel  that is the case. He is walking for at least 30 minutes every day. He  says he gets his heart rate up around 95. He has been compliant with all  of his medications. Unfortunately, he was unable to tolerate the Niaspan  and he has switched over to niacin the flush free brand.   CURRENT MEDICATIONS:  1. Lipitor 80.  2. Aspirin 325.  3. Lisinopril 10.  4. Lopressor 25 b.i.d.  5. Lumigan eye drops.  6. Hydrochlorothiazide 25.  7. Potassium 20 a day.  8. Niacin flush free 1000 a day.   PHYSICAL EXAMINATION:  He is well-appearing in no acute distress. Voice  is mildly nasal. Blood pressure is 108/62, heart rate 61, weight is 201,  which is up 15 pounds.  HEENT: Normal, except for his cleft palate repair.  NECK: is supple. There is no JVD. Carotids are 2+ bilaterally without  bruits. There is no lymphadenopathy or thyromegaly.  CARDIAC: PMI is nondisplaced. Is a regular rate and a rhythm. No  murmurs, rubs, and there is a +S4.  LUNGS:  Are clear.  ABDOMEN: Soft, nontender, nondistended. No hepatosplenomegaly. No  bruits. No masses. Good bowel sounds.  EXTREMITIES: Are warm with no cyanosis, clubbing. There is a trace  edema. Good pulses.  NEURO:  Alert and oriented x3. Cranial nerves II-XII are intact. Moves  all 4 extremities without difficulty. Affect is pleasant.   EKG shows normal sinus rhythm with a heart rate of 61 with a first-  degree AV block. No significant ST-T-wave abnormalities.   ASSESSMENT/PLAN:  1. Coronary artery disease. This is stable, status post recent bypass      surgery. Continue medical therapy.  2. Hypertension, well-controlled.  3. Hyperlipidemia. We will titrate his niacin up to 1500 a day and we      will recheck his lipids and liver panel in two months.   DISPOSITION:  Return to clinic in six months.     Bevelyn Buckles. Bensimhon, MD  Electronically Signed    DRB/MedQ  DD: 02/26/2007  DT: 02/26/2007  Job #: [id number]

## 2010-12-07 NOTE — Assessment & Plan Note (Signed)
[facility]                            CARDIOLOGY OFFICE NOTE   NAME:Tinoco, KAINOAH BARTOSIEWICZ                        MRN:          [id number]  DATE:07/28/2008                            DOB:          07/27/1934    PRIMARY CARE PHYSICIAN:  Tinnie Gens A. Tawanna Cooler, MD   INTERVAL HISTORY:  Ian Hale is a delightful 76 year old male with history  of coronary artery disease, status post non-ST-elevation myocardial  infarction in July 2007, followed by bypass surgery.  He also has a  history of hypertension, hyperlipidemia with low HDL, and history of  cleft palate status post repair.  He returns today for routine followup.   Overall, he is doing fairly well.  He continues to walk most days for 30  minutes a day without any chest pain or shortness of breath.  He did  have several episodes of significant reflux disease, which he treated  with Pepto-Bismol or Rolaids and decreasing the acidic nature of his  diet.  He says he feels much better.  He has not had any symptoms in  several days.  This has not been exertional.  He is otherwise tolerating  his medicines well.   CURRENT MEDICATIONS:  1. Lipitor 80 mg a day.  2. Lisinopril 10 a day.  3. Lopressor 25 b.i.d.  4. Hydrochlorothiazide 25 a day.  5. Potassium 20 a day.  6. Niacin 1000 b.i.d.  7. Aspirin 81 a day.   PHYSICAL EXAMINATION:  GENERAL:  He is well appearing, in no acute  distress.  VITAL SIGNS:  Blood pressure is 130/68, heart rate 64, and weight is  218.  HEENT:  Normal except for a cleft palate repair.  NECK:  Supple.  No JVD.  Carotids are 2+ bilaterally without bruits.  There is no lymphadenopathy or thyromegaly.  CARDIAC:  PMI is nondisplaced.  Regular rate and rhythm.  No murmurs or  rubs.  He does have an S4.  LUNGS:  Clear.  ABDOMEN:  Soft, nontender, and nondistended.  No hepatosplenomegaly, no  bruits, no masses.  Good bowel sounds.  EXTREMITIES:  Warm without  cyanosis or clubbing.  There is 2+  edema in the knee down bilaterally.  NEUROLOGIC:  Alert and oriented x3.  Cranial nerves II-XII are intact.  Moves all 4 extremities without difficulty.  Affect is pleasant.   EKG shows sinus rhythm at a rate of 64 with a first-degree AV block at  226 milliseconds.   ASSESSMENT AND PLAN:  1. Coronary artery disease, this is stable.  No evidence of ischemia.      He does appear to be having some reflux, which I told and he could      take Zantac or Protonix.  If this continues, he may need to see GI.      This does not appear to be an anginal equivalent at this point.  2. Lower extremity edema.  This appears to be more venous      insufficiency.  His neck veins are not too elevated.  We are going      to put  compression stockings on him.  We will also add Lasix 20 mg      a day and increase his potassium to 20 b.i.d.  We will check a BMET      on Wednesday and then again in 2 weeks.  We will also check a BMP.  3. Hypertension.  Blood pressure is mildly elevated.  We are adding      Lasix, we will see how this goes.  I suspect this is also in part      due to his weight gain.  I have urged him to continue his exercise      program and watch his diet.  4. Hyperlipidemia followed by Dr. Tawanna Cooler.  Goal LDL is less than 70.      Continue current therapy   DISPOSITION:  I will see him back in 6 months for a followup.     Bevelyn Buckles. Bensimhon, MD  Electronically Signed    DRB/MedQ  DD: 07/28/2008  DT: 07/29/2008  Job #: [id number]   cc:   Tinnie Gens A. Tawanna Cooler, MD

## 2010-12-07 NOTE — Assessment & Plan Note (Signed)
[facility]                            CARDIOLOGY OFFICE NOTE   NAME:Cowin, AUSENCIO VADEN                        MRN:          [id number]  DATE:10/01/2007                            DOB:          Sep 02, 1934    HISTORY:  Mr. Sequeira is a delightful 76 year old male with history of  coronary artery disease status post non-ST-elevation myocardial  infarction July 2007 followed by bypass surgery.  He also has a history  of hypertension, hyperlipidemia, low HDL and history of cleft palate  status post repair.  He returns today for routine followup.   He is doing great.  He is walking briskly for 30 minutes a day without  any chest pain or shortness of breath.  He has not had any heart failure  symptoms.  He has been tolerating his medications well.  He has not had  too much problems with his Niacin.   CURRENT MEDICATIONS:  1. Lipitor 80 mg a day.  2. Aspirin 325.  3. Lisinopril 10 a day.  4. Lopressor 25 b.i.d.  5. Lumigan eye drops.  6. Hydrochlorothiazide 25 a day.  7. Potassium 20 a day.  8. Niacin flush free 2000 mg a day.   PHYSICAL EXAM:  Well-appearing and in no acute distress.  Blood pressure  is 104/64, heart rate is 53, weight is 205.  He ambulates around the  clinic without any respiratory difficulty.  HEENT is normal except for a cleft palate repair.  NECK: is supple. No JVD. Carotids are 2+ bilaterally without bruits.  There is no lymphadenopathy, thyromegaly.  CARDIAC: PMI is not displaced. He has a regular rate and rhythm. No  murmurs, rubs or gallops. There is a soft S4.  LUNGS:  Are clear.  ABDOMEN: Soft, nontender, nondistended. No hepatosplenomegaly. No  bruits. No masses. Good bowel sounds.  EXTREMITIES: Are warm with no cyanosis, clubbing or edema. Good pulses.  NEURO:  is alert and oriented x3. Cranial nerves II-XII are intact.  Moves all 4 extremities without difficulty. Affect is pleasant.   EKG shows sinus bradycardia with a rate  of 53 with a first-degree AV  block at 234 milliseconds.   ASSESSMENT/PLAN:  1. Coronary artery disease, doing quite well.  No evidence of      ischemia.  Continue current medications and exercise plan.  2. Hypertension well-controlled.  3. Hyperlipidemia.  Most recent cholesterol panel shows total      cholesterol 85, triglycerides 41, HDL 34 which is up from 29 and      LDL 43. These are well-controlled.  I did ask him to add 2 grams of      fish oil a day.  4. Bradycardia.  This is asymptomatic.  Will continue where he is at      for now. I told him if he notices his heart rate going into the      40s, would consider decreasing the Lopressor down to 12.5 b.i.d.   DISPOSITION:  Can follow up in 9 months to year.     Bevelyn Buckles. Bensimhon, MD  Electronically Signed  DRB/MedQ  DD: 10/01/2007  DT: 10/01/2007  Job #: [id number]

## 2010-12-10 NOTE — Cardiovascular Report (Signed)
NAMELAYDEN, CATERINO NO.:  [id number]   MEDICAL RECORD NO.:  [id number]          PATIENT TYPE:  INP   LOCATION:  2807                         FACILITY:  [facility]   PHYSICIAN:  Arvilla Meres, M.D. [facility] OF BIRTH:  [birth year]   DATE OF PROCEDURE:  02/15/2006  DATE OF DISCHARGE:                              CARDIAC CATHETERIZATION   PRIMARY CARE PHYSICIAN:  Tinnie Gens A. Tawanna Cooler, M.D. [facility]   CARDIOLOGIST:  Arvilla Meres, M.D. [facility]   PATIENT IDENTIFICATION:  Mr. Crum is a very pleasant 76 year old male who  has had a several month history of exertional left arm pain.  This morning  he had an episode of severe pain at rest.  He came to Dr. Tawanna Cooler who referred  to him to [facility] ER.  EKG was nonacute.  However, troponin was mildly  elevated consistent with non-ST elevation myocardial infarction and he is  brought to the diagnostic catheterization laboratory.   PROCEDURES PERFORMED:  1.  Selective coronary angiography.  2.  Left heart cath.  3.  Left ventriculogram.  4.  Abdominal aortogram.  5.  Left subclavian angiography to evaluate the patency of the subclavian      for CABG.  6.  Angio-Seal closure device.   DESCRIPTION OF PROCEDURE:  The risks and benefits of catheterization are  explained to Mr. Romeo Apple as well as his wife and daughter.  Consent is signed  and placed on the chart.  The 6-French arterial sheath is placed in the  right femoral artery using a modified Seldinger technique.  Standard  catheters including JL-4, JR-4, and angled pigtail are used for the  procedure.  All catheter exchanges made over wire.  There are no apparent  complications.  At the end of the procedure the patient had his right  femoral arteriotomy site sealed with the Angio-Seal closure device.  There  is good hemostasis.  Central aortic pressure is 113/60, mean of 82.  LV  pressure 119/40 and EDP of 11.  There is no aortic stenosis.   The left main is normal.   The LAD is a heavily  calcified vessel.  It gave off a small first diagonal,  moderate-sized second diagonal, and a small third diagonal.  In the proximal  portion of the LAD, there is a 90% to 95% hazy lesion.  This is followed by  a tubular 70% lesion in the mid section. There is a 40% tubular lesion.  There is a 95% lesion distally after the takeoff of the third diagonal.  In  the proximal portion of the first diagonal there is an 80% lesion and in the  proximal portion of the second diagonal there is a 60% lesion.   The left circumflex is a moderate-size system.  It gave off a moderate-size  ramus, a moderate-size OM-1, a small OM-2, and a small OM-3.  There is a 70%  tubular lesion in the ostium of the ramus and a 40% focal lesion in the OM-  1.   The right coronary artery is a large dominant vessel.  It is heavily  calcified proximally.  There is a 34% lesion proximally, a 60% lesion in the  midsection,  99% lesion distally for the takeoff the PDA, and a 70% to 80%  lesion in the distal RCA prior to the PL.  The PDA was a moderate-size  system with TIMI II flow secondary to competitive flow.  There are left-to-  right collaterals. There is also a large branching first posterolateral,  which had some mild to moderate disease.   Left ventriculogram done in the RAO position showed an EF of approximately  50% with no mitral regurgitation.   The left subclavian is widely patent widely patent with a patent LIMA to the  chest wall.  In the right renal artery has an apparent 50% lesion in the  midsection and this is hard to visualize completely.   ASSESSMENT:  1.  Severe three-vessel disease with high-grade proximal left anterior      descending and distal right coronary lesions.  2.  Low normal left ventricular function.  3.  Moderate right renal artery stenosis.   PLAN:  1.  Refer to a CVTS for possible bypass surgery.  I have reviewed the films      with Dr. Riley Kill who agrees.      Arvilla Meres, M.D. [facility]  Electronically Signed     DB/MEDQ  D:  02/15/2006  T:  02/15/2006  Job:  [id number]   cc:   Arvilla Meres, M.D. [facility] A. Tawanna Cooler, M.D. [facility]

## 2010-12-10 NOTE — Discharge Summary (Signed)
Ian Hale, Ian Hale NO.:  [id number]   MEDICAL RECORD NO.:  [id number]          PATIENT TYPE:  INP   LOCATION:  2023                         FACILITY:  [facility]   PHYSICIAN:  Pecola Leisure, PA   DATE OF BIRTH:  1935/01/19   DATE OF ADMISSION:  02/15/2006  DATE OF DISCHARGE:                                 DISCHARGE SUMMARY   ADMISSION DIAGNOSIS:  Acute myocardial infarction.   PAST MEDICAL HISTORY:  1.  Hypertension.  2.  Several surgeries for repair of cleft palate.  3.  Benign prostatic hypertrophy.  4.  Severe 3 vessel coronary artery disease status post coronary artery      bypass grafting x6.  5.  Postoperative acute blood loss anemia currently stable.   ALLERGIES:  NO KNOWN DRUG ALLERGIES.   BRIEF HISTORY:  The patient is a 76 year old active male who presented with  a 6 month history of aching in his left arm while walking.  This typically  occurred approximately 10 minutes into the walk and lasted for several  minutes and then resolved.  The week prior to admission he experienced  increased frequency of these episodes and they became more intense.  On the  night prior to admission, the patient experienced an episode while getting  ready for bed that persisted for approximately 2 hours.  He presented to his  primary care physician and was then sent to the emergency room on February 15, 2006 where he was evaluated by Dr. Milas Kocher.  He was ruled in for a non ST-  segment myocardial infarction with an initial troponin of 1.75.  He was  admitted for routine hospital care.   HOSPITAL COURSE:  The patient was admitted via the emergency room and ruled  in for an non ST-segment elevation myocardial infarction as previously  stated.  He was maintained on routine hospital care and then underwent  cardiac catheterization on February 15, 2006 by Dr. Milas Kocher.  This revealed  severe 3 vessel coronary artery disease with a left ventricular ejection  fraction of 45-50%  and no mitral regurgitation.  There was also no  significant granula across the aortic valve.  Secondary to these findings,  Dr. Hortencia Pilar of [facility] services consultated and he evaluated the patient on  February 15, 2006 and it was his opinion that the patient should proceed with  surgical revascularization.   The patient remained in stable condition until he could be taken to the OR.  He was then taken to the OR on February 17, 2006 for coronary artery bypass  grafting x6.  The left internal mammary artery to the LAD, saphenous vein  was grafted in sequence to the first and second diagonals, saphenous vein  was grafted in sequence to the PD and PL and saphenous vein was grafted to  the intermediate.  Harvesting was performed on the right lower extremity.  The patient tolerated the procedure well and was stable immediately  postoperatively.  The patient was transferred from the OR to the SICU in  stable condition.  The patient was extubated without complication, woke  up  from anesthesia neurologically intact.   The patient's postoperative course has progressed as expected.  On  postoperative day he was without complaint, afebrile with stable vital signs  and maintaining a normal sinus rhythm.  All basic lines and chest tubes were  discontinued in a routine manner and he tolerated this well.  The patient  has been volume overloaded postoperatively and has been diuresed  accordingly.  He is also noted to have an acute blood loss anemia throughout  the postoperative course.  This has remained stable and the patient has  remained asymptomatic from this.  The patient's only difficulty in the early  postoperative course was nausea and this subsequently resolved as well.   The patient began cardiac rehabilitation on postoperative day #2 and has  continued to increase his tolerance to a satisfactory level at this time.  On postoperative day #3 the patient's bowel and bladder function had  returned.  His  only complaint is of some mild nausea.  He is afebrile with  stable vital signs and maintaining a normal sinus rhythm.  On physical exam  cardiac is regular rate and rhythm.  The incisions are clean, dry and  intact.  The lungs are clear and there is no edema present in the  extremities.  The patient is in stable condition at this time and will be  ready for discharge in the next 1-2 days pending reevaluation.   LABORATORY DATA:  CBC and BMP on February 20, 2006 white count 7.8, hemoglobin  9.6, hematocrit 28, platelets 176, sodium 138, potassium 3.6, BUN 21,  creatinine 1.3, glucose 98.   CONDITION ON DISCHARGE:  Improved.   INSTRUCTIONS:  1.  Medications: Aspirin 325 mg daily, Lopressor 25 mg b.i.d., Lipitor 80 mg      daily, Travatan drops nightly, Lasix 40 mg daily x5 days, K-Dur 20 mEq      daily x5 days, Tylox 1-2 q. 4-6 hours p.r.n. pain.  2.  Activity: No driving, no lifting more than 10 pounds for 3 weeks and the      patient should continue daily walking exercises.  3.  Diet: Low salt, low fat.  4.  Wound care: The patient can shower daily and clean incisions with soap      and water.  If wound problems arise, the patient should contact the      office.  5.  Followup appointment with Dr. Milas Kocher 2 weeks after discharge.  [facility] will establish this appointment for the patient.  AP and      lateral chest x-ray will be taken at that time.  Followup with Dr.      Electa Sniff on March 07, 2006 at 11:45 a.m.      Pecola Leisure, PA     AY/MEDQ  D:  02/20/2006  T:  02/20/2006  Job:  [id number]

## 2010-12-10 NOTE — Assessment & Plan Note (Signed)
[facility]                              CARDIOLOGY OFFICE NOTE   NAME:Nims, WM SAHAGUN                        MRN:          [id number]  DATE:03/08/2006                            DOB:          1934/09/16    PATIENT IDENTIFICATION:  Mr. Mcadory is a very pleasant 76 year old male who  returns for a post hospital followup after his bypass surgery.   PROBLEM LIST:  1. Coronary artery disease:  A: Status post non-ST elevation of myocardial      infarction July 2007.  B: Cardiac catheterization with  3-vessel disease and an ejection fraction of 50%.  C: Status post coronary  artery bypass surgery by Dr. Laneta Simmers on February 18, 2006.  1. Hypertension.  2. History of cleft palate status post surgical repair.  3. Benign prostatic hyperplasia.   CURRENT MEDICATIONS:  1. Lipitor 80 mg a day.  2. Aspirin 325 a day.  3. Metoprolol 25 b.i.d.  4. Lumigan eye drops.   ALLERGIES:  No known drug allergies.   INTERVAL HISTORY:  Mr. Breit returns today.  He says he is doing great.  He  feels much better than prebypass.  He is walking 30 minutes a day and feels  like his exercise tolerance is better than before.  Denies any significant  chest pain.  He is not having any heart failure symptoms.  He has had some  mild swelling of the right lower extremity where the vein was harvested.  He  is not having any palpitations.  His weight is back down under his  presurgery weight.   PHYSICAL EXAMINATION:  GENERAL:  He is well-appearing, in no acute distress,  ambulates well without any difficulty.  VITAL SIGNS:  Respirations are unlabored. Blood pressure is 140/64, heart  rate 79, weight 188 pounds.  HEENT:  Sclerae anicteric. EOMI. There are no xanthelasmas. Mucous membranes  are moist. He has a cleft palate and lip which have been repaired.  NECK:  Supple. JVP is about 6-7 cm of water, carotids are 2+ bilaterally and  bruits. There is no lymphadenopathy or thyromegaly.  CARDIAC:  Regular rate and rhythm. No murmurs, rubs or gallops.  LUNGS:  Clear.  ABDOMEN:  Soft, nontender, nondistended. No hepatosplenomegaly. No bruits.  No masses. His sternum is stable.  EXTREMITIES:  Warm with no cyanosis or clubbing. On his right lower  extremity at the site of the vein harvest, there is 2+ edema and a small  knot on the medial side.  NEURO:  He is alert and oriented x3. Cranial nerves II-XII intact. He moves  all four extremities without difficulty.   EKG shows normal sinus rhythm at a rate of 79 with a 1st degree AV block and  a polymorphic ventricular couplet.   ASSESSMENT AND PLAN:  1. Coronary artery disease status post coronary artery bypass graft. He is      doing remarkably well after bypass surgery. He will continue on his      current medications.  2. Hypertension, blood pressure is still mildly elevated. We will start  him on Lisinopril 10 mg a day and check a edema next week.  3. Hyperlipidemia; continue Lipitor, follow up with c-Met and lipids in 3      months.  4. Right lower extremity swelling. I suspect this is just post-bypass      change, but I do think it is reasonable to get an ultrasound to rule      out a deep venous thrombosis. We will start him on low-dose Lasix at 20      mg a day for 3-5 days with some supplemental potassium.   DISPOSITION:  I have cleared him to drive after 4 weeks, which will be 1  more week. He will follow up with [facility] in 2 weeks. He is scheduled to go to  cardiac rehab after seeing Dr. Laneta Simmers.                                Bevelyn Buckles. Bensimhon, MD    DRB/MedQ  DD:  03/08/2006  DT:  03/08/2006  Job #:  [id number]   cc:   Tinnie Gens A. Tawanna Cooler, MD  Evelene Croon, MD

## 2010-12-10 NOTE — Op Note (Signed)
[name], Hale                 ACCOUNT NO.:  [id number]   MEDICAL RECORD NO.:  [id number]          PATIENT TYPE:  INP   LOCATION:  2023                         FACILITY:  [facility]   PHYSICIAN:  Evelene Croon, M.D.     DATE OF BIRTH:  1934-11-18   DATE OF PROCEDURE:  02/17/2006  DATE OF DISCHARGE:                                 OPERATIVE REPORT   PREOPERATIVE DIAGNOSIS:  Severe 3-vessel coronary artery disease.   POSTOPERATIVE DIAGNOSIS:  Severe 3-vessel coronary artery disease.   OPERATIVE PROCEDURE:  Median sternotomy, extracorporeal circulation,  coronary artery bypass surgery x6 using a left internal mammary artery graft  to the left anterior descending coronary artery, with a sequential saphenous  vein graft to the 1st and 2nd diagonal branches of the LAD, a saphenous vein  graft to the intermediate coronary artery, and a sequential saphenous vein  graft to the posterior descending and posterolateral branches of the right  coronary artery.  Endoscopic vein harvesting from the right leg.   ATTENDING SURGEON:  Evelene Croon, MD   ASSISTANT:  Coral Ceo, PA-C   ANESTHESIA:  General endotracheal.   CLINICAL HISTORY:  This patient is a 76 year old gentleman admitted with  unstable angina.  Cardiac catheterization showed severe 3-vessel coronary  artery disease.  Left ventricular function was well preserved.  After review  of the angiogram and examination of the patient, it was felt that coronary  artery bypass graft surgery was the best treatment to prevent further  ischemia and infarction and improve his quality of life.  I discussed the  operative procedure with the patient including alternatives, benefits and  risks, increasing bleeding, infection, stroke, myocardial infarction, graft  failure, and death.  They understood and wished to proceed.   OPERATIVE PROCEDURE:  The patient was taken to the operating room and placed  on the table in a supine position.  After  induction of general endotracheal  anesthesia, a Foley catheter was placed in the bladder using sterile  technique.  Then, the chest, abdomen, and both lower extremities were  prepped and draped in the usual sterile manner.  The chest was entered  through a median sternotomy incision, the pericardium opened midline.  Examination of the heart showed good ventricular contractility.  The  ascending aorta had no palpable plaques in it.   Then, the left internal mammary artery was harvested from the chest wall as  a pedicle graft.  This was a medium-caliber vessel with excellent blood flow  through it.  At the same time, a 7-mm greater saphenous vein was harvested  from the right leg using endoscopic vein harvest technique.  This vein had  medium size and good quality.   Then, the patient was heparinized and when adequate activated clotting time  was achieved, the distal ascending aorta was cannulated using a 20-French  aortic cannula for arterial inflow.  Venous outflow was achieved using a 2-  stage venous cannula for the right atrial appendage.  An antegrade  cardioplegia and vent cannula was inserted in the aortic root.   The patient was placed on  cardiopulmonary bypass and the distal coronaries  identified.  The LAD was a medium-sized graftable vessel distally.  It was  deep beneath the epicardial fat.  The 1st diagonal was a large, graftable  vessel.  The 2nd diagonal was small, but graftable.  This vessel supplied  the mid portion of the LAD through retrograde flow, and therefore, I thought  it was important to graft.  The intermediate was a medium-sized, graftable  vessel.  The obtuse marginal was small and lying quite high on the lateral  wall, and not felt to be graftable.  The posterior descending artery had  severe proximal disease in it.  I had to graft this fairly distally, and it  was a medium-sized, graftable vessel here.  The posterolateral was a medium-  sized vessel with  no distal disease in it.   Then, the aorta was cross-clamped, and 500 mL of cold blood antegrade  cardioplegia was administered in the aortic root with quick arrest of the  heart.  Systemic hypothermia to 28 degrees centigrade, and topical  hypothermia with ice saline was used.  A temperature probe was placed in the  septum and an insulating pad in the pericardium.   The 1st distal anastomosis was performed to the posterior descending  coronary artery.  The internal diameter of this vessel distally was about  1.6 mm.  The conduit used was a 7-mm greater saphenous vein, and anastomosis  performed in a sequential side-to-side manner using continuous 7-0 Prolene  suture.  Flow was measured through the graft and was excellent.   The 2nd distal anastomosis was performed to the posterolateral branch.  The  internal diameter of this vessel was about 1.6 mm.  The conduit used was the  same 7-mm greater saphenous vein, and anastomosis performed in a sequential  end-to-side manner using continuous 7-0 Prolene suture.  Flow was measured  through the graft and was excellent.  Another dose of cardioplegia given  down vein graft and in the aortic root.   The 3rd distal anastomosis was performed to the intermediate branch.  The  internal diameter of this vessel was about 1.6 mm.  The conduit used was a  second 7-mm greater saphenous vein.  Anastomosis performed in an end-to-side  manner using continuous 7-0 Prolene suture.  Flow was measured through the  graft and was excellent.   The 4th distal anastomosis was performed to the 1st diagonal branch.  The  internal diameter was 1.75 mm.  The conduit used was a third 7-mm greater  saphenous vein, and anastomosis performed in a sequential side-to-side  manner using continuous 7-0 Prolene suture.  Flow was measured through the  graft and was excellent.   The 5th distal anastomosis was performed to the 2nd diagonal branch.  The internal diameter was  about 1.5 mm.  The conduit used was the same 7-mm  greater saphenous vein, and anastomosis performed in a sequential end-to-  side manner using continuous 7-0 Prolene suture.  Flow was measured through  the graft and was excellent.  Then, another dose of cardioplegia was given  down the vein grafts and in the aortic root.   The 6th distal anastomosis was performed in the distal LAD.  The internal  diameter was about 1.75 mm.  The conduit used was the left internal mammary  graft and was brought up to the opening of the left pericardium anterior to  the phrenic nerve.  It with a anastomosed to the LAD in end-to-side manner  using continuous 8-0 Prolene suture.  The pedicle was sutured to the  epicardium with 6-0 Prolene sutures.  The patient was then rewarmed to 37  degrees centigrade.  Another dose of cardioplegia was given.  A proximal  anastomosis was then performed in the aortic root with the cross-clamp in  place in an end-to-side manner using continuous 6-0 Prolene suture.  Then,  the clamp was removed from the mammary pedicle.  There was rapid warming of  the ventricular septum and return of spontaneous ventricular fibrillation.  Cross-clamp was removed, with time of 90 minutes, and the patient  spontaneously converted to sinus rhythm.   The proximal and distal anastomoses appeared hemostatic, lie of grafts  satisfactory.  Graft marker was placed around the proximal anastomoses. Two  temporarily right ventricular and right atrial pacing wires were placed and  brought through the skin.   When the patient had rewarmed to 37 degrees centigrade, he was weaned from  cardiopulmonary bypass on no inotropic agents.  Total bypass time was 104  minutes.  Cardiac function appeared excellent with a cardiac output of 5  liters a minute.  Protamine was given, and the venous and aortic cannulas  were removed without difficulty.  Hemostasis was achieved.  Three chest  tubes were placed, a tube in  the posterior pericardium, 1 in the left  pleural space, and 1 in the anterior mediastinum.  The pericardium was  loosely reapproximated over the heart.  The sternum was closed #6 stainless  steel wire.  Fascia was closed with continuous #1 Vicryl suture.  Subcutaneous tissue was closed with continuous 2-0 Vicryl, and the skin with  3-0 Vicryl subcuticular closure.  The lower extremity vein harvest sites  were closed in a similar manner.  The sponge, needle and instrument counts  were correct according to scrub nurse.  Dry sterile dressings were applied  over the incisions and around the chest tubes, which were Pleur-Evac  suction.  The patient remained hemodynamically stable and was transported to  the SICU in guarded, but stable condition.      Evelene Croon, M.D.  Electronically Signed     BB/MEDQ  D:  02/18/2006  T:  02/19/2006  Job:  [id number]   cc:   [facility]  [facility] Cardiac Cath Lab

## 2010-12-10 NOTE — H&P (Signed)
NAMEJAXIEL, KINES NO.:  [id number]   MEDICAL RECORD NO.:  [id number]          PATIENT TYPE:  INP   LOCATION:  1825                         FACILITY:  [facility]   PHYSICIAN:  Arvilla Meres, M.D. LHCDATE OF BIRTH:  [birth year]   DATE OF ADMISSION:  02/15/2006  DATE OF DISCHARGE:                                HISTORY & PHYSICAL   PRIMARY CARDIOLOGIST:  New, being seen by Arvilla Meres, M.D.   PRIMARY CARE PHYSICIAN:  Tinnie Gens A. Todd, M.D.   Mr.  Holsworth is a very pleasant 76 year old Caucasian gentleman with no known  coronary artery disease history, only history of mildly elevated  hypertension.  For the past 6 months, he has noticed some left arm aching  while walking.  He normally tries to walk approximately 2 miles at least 2-3  days a week.  He states the ache in his left arm begins approximately 10  minutes into his walk every time.  He has no associated symptoms with it.  He states he rests for a few minutes; it eases off; he continues his walk  just at a slower pace.  The discomfort usually resolves.  This past week, he  has noted increase in frequency, and the discomfort has been more intense.  It usually lasts around 5 minutes.  Last night he experienced an episode  while he was not active.  The pain was much more intense in his left arm.  It lasted around 2 hours.  He felt some residual effect across his chest.  Once again, he denied any associated symptoms with it.  He states he was  unable to get comfortable last night, very restless.  He finally fell asleep  in the recliner.  Today he feels somewhat sore, denies any discomfort at the  present time.  He presented to Dr. Nelida Meuse office who had patient sent to the  emergency room for further evaluation.   PAST MEDICAL HISTORY:  1.  Hypertension x5 years.  2.  He is hard of hearing.  3.  He has had several cleft palate repairs to his face.  4.  History of BPH.   ALLERGIES:  No known drug  allergies.   MEDICATIONS:  1.  Travatan 1 drop both eyes at bedtime.  2.  Hydrochlorothiazide 25 mg daily.  3.  Tenoretic 50/25 mg daily.  4.  Aspirin 325 mg.  5.  Saw Palmetto.  6.  Potassium 20 mEq daily.   SOCIAL HISTORY:  He lives in [place] with his wife.  He is a Investment banker, corporate.  He has 3 adult children.  He has never used tobacco.  He drinks  approximately 2 beers a week.  Denies any drug or herbal medication use.  For exercise, he tries to walk frequently, approximately 2 miles.   FAMILY HISTORY:  Mother deceased in her 64s with history of COPD.  Father  deceased at age 67 secondary to an MI.  No siblings with heart disease.   REVIEW OF SYSTEMS:  Positive for left arm pain as described above and some  chest soreness, otherwise  negative.   PHYSICAL EXAMINATION:  VITAL SIGNS: Temperature 96.7, pulse 63, respirations  18, blood pressure 180/87.  GENERAL:  The patient is in no acute distress, very pleasant and cooperative  gentleman.  NECK: Supple without lymphadenopathy, negative bruit, negative JVD.  CARDIOVASCULAR: Heart rate regular rhythm.  S1 and S2.  He has a soft S4.  Pulses are 2+ an equal without bruits.  LUNGS: Clear to auscultation bilaterally.  ABDOMEN:  Soft, nontender.  Positive bowel sounds.  EXTREMITIES:  Lower extremities without clubbing, cyanosis, or edema.  NEUROLOGIC:  Alert and oriented x3.  Cranial nerves II-XII grossly intact.   Chest x-ray results pending.   EKG: Rate of 60.  The patient is in sinus rhythm.  He has nonspecific ST  changes in lead III, possibility of some slight ST depression in V4,  otherwise negative.   Lab work available at this time: WBC 9.8, hemoglobin 16.9, hematocrit 49.9,  platelets 203,000.  Cardiac markers with CK-MB 29, troponin 1.75 point-of-  care.   Dr. Nicholes Mango in to examine and assess patient.   IMPRESSION:  Non-ST-elevation myocardial infarction.  The patient needs  further diagnostic  evaluation.  Risks and benefits of cardiac  catheterization discussed with patient and his wife.  The patient agrees to  proceed with cardiac catheterization.  He will be taken to the  catheterization lab at this time.  He has been bolused with 5000 units of  heparin and drip started at 1000.  Lab work pending includes chemistries.  The patient will be admitted to a step-down unit postprocedure.      Dorian Pod, NP      Arvilla Meres, M.D. [facility]  Electronically Signed    MB/MEDQ  D:  02/15/2006  T:  02/15/2006  Job:  [id number]

## 2010-12-10 NOTE — Consult Note (Signed)
Ian Hale, Ian Hale                 ACCOUNT NO.:  [id number]   MEDICAL RECORD NO.:  [id number]          PATIENT TYPE:  INP   LOCATION:  2907                         FACILITY:  [facility]   PHYSICIAN:  Evelene Croon, M.D.     DATE OF BIRTH:  [birth year]   DATE OF CONSULTATION:  02/15/2006  DATE OF DISCHARGE:                                   CONSULTATION   CARDIOVASCULAR SURGICAL CONSULTATION:   REFERRING PHYSICIAN:  Dr. Arvilla Meres.   REASON FOR CONSULTATION:  Severe three-vessel coronary artery disease status  post non-ST segment elevation MI.   CLINICAL HISTORY:  I was asked to evaluate this patient for coronary artery  bypass graft surgery by Dr. Arvilla Meres.  He is a 76 year old active  gentleman who presented with a 49-month history of aching in his left arm  while walking.  It typically occurs about 10 minutes into his walk and lasts  for a few minutes and resolves.  Over the past week he has had increased  frequency of these episodes and they have become more intense.  Last night  he had an episode while getting ready for bed that persisted for about 2  hours.  He was admitted today and ruled in for a non-ST segment elevation MI  with initial troponin of 1.75.  He underwent cardiac catheterization today  by Dr. Gala Romney which showed severe three-vessel coronary artery disease.  The LAD had a hazy proximal 95% stenosis with diffuse 70% mid vessel  stenosis.  There was a 95% distal stenosis after the third diagonal branch.  There was a moderate size first diagonal that had about 80% stenosis.  The  left circumflex had an intermediate at about 70% proximal stenosis.  The  right coronary artery was a dominant vessel that had 60% mid vessel stenosis  and 99% distal stenosis before the posterior descending branch.  There was a  70% distal stenosis between the posterior descending and posterolateral  branch.  Left ventricular ejection fraction was 45-50% with no mitral  regurgitation.  There is no significant gradient across the aortic valve.  The patient is currently in the TCU on intravenous nitroglycerin without  pain.   REVIEW OF SYSTEMS:  GENERAL:  He denies any fever or chills.  He has had no  recent weight changes.  He denies fatigue.  EYES:  Negative.  ENT:  He is  hard of hearing.  CARDIOVASCULAR:  As above.  He has had no chest pain or  shortness of breath.  He denies PND or orthopnea.  He has had no  palpitations or peripheral edema.  RESPIRATORY:  He denies cough and sputum  production.  GI:  He denies nausea or vomiting.  He has had no melena or  bright red blood per rectum.  GU:  He denies dysuria and hematuria.  MUSCULOSKELETAL:  He denies arthralgias and myalgias.  VASCULAR:  He denies  claudication or phlebitis.  NEUROLOGICAL:  He denies any focal weakness or  numbness.  He denies dizziness and syncope.  PSYCHIATRIC:  Negative.   ALLERGIES:  NONE.  PAST MEDICAL HISTORY:  Past medical history is significant for hypertension  x5-6 years.  He has no history of diabetes.  His only previous history is of  cleft lip and palate repair.  He has a history of BPH.   MEDICATIONS:  His home medications prior to admission were Travatan 1 drop  OU q.h.s., hydrochlorothiazide 25 mg daily, Tenoretic 50/25 daily, aspirin  325 mg daily, saw palmetto daily, and potassium chloride 20 mEq daily.   SOCIAL HISTORY:  He lives in [place] with his wife.  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  He has never smoked.  He denies  a history of alcohol abuse but drinks beer occasionally.  He has three  children.   FAMILY HISTORY:  His father died of myocardial infarction and also had COPD.  His mother died in her 87s of COPD.  Both were heavy smokers.  None of his  siblings have coronary disease.   PHYSICAL EXAMINATION:  VITAL SIGNS:  His blood pressure is 115/65 and his  pulse is 60 and regular.  Respiratory rate is 18 unlabored.   GENERAL:  He is a well-developed white male in no distress.  HEENT:  Exam shows him to be normocephalic and atraumatic.  Pupils are equal  and reactive to light and accommodation.  Extraocular muscles are intact.  His throat is clear.  Neck exam shows normal carotid pulses bilaterally.  There are no bruits.  There is no adenopathy or thyromegaly.  He does have a  cleft lip and palate which has been surgically repaired.  CARDIAC:  Exam shows a regular rate and rhythm.  Normal S1 and S2.  There is  no murmur, rub, or gallop.  LUNGS:  Clear.  ABDOMEN:  Exam shows active bowel sounds.  His abdomen is soft, flat,  nontender.  There are no palpable masses or organomegaly.  EXTREMITIES:  Exam shows no peripheral edema.  Pedal pulses bilaterally.  SKIN:  His skin is warm and dry.  NEUROLOGIC:  Exam shows him to be alert and oriented x3.  Motor and sensory  are grossly normal.   STUDIES:  Carotid Doppler examination shows no evidence of internal carotid  artery stenosis.  His upper extremity vascular exam was normal.   Laboratory examination on admission shows a troponin of 2.53 with a CK of  369, MB of 38.7.  Electrolytes are normal with a BUN of 23 and creatinine  1.3.  His hemoglobin is 16.9 with a platelet count 203,000.  His glucose is  100 on admission.   Electrocardiogram shows sinus rhythm with a possible anterior infarct.  This  could be LVH.   IMPRESSION:  Mr. Oguinn has severe three-vessel coronary artery disease with  unstable angina presenting with non-ST segment elevation myocardial  infarction.  I agree that coronary artery bypass graft surgery is the best  treatment to prevent further ischemia and infarction and improve his quality  of life.  I discussed the operative procedure with the patient including  alternatives, benefits, and risks including bleeding, blood transfusion,  infection, stroke, myocardial infarction, graft failure, and death.  He understands and agrees to  proceed.  I have scheduled him for Friday morning  which is the first open operative time.  He will be continued on intravenous  heparin, Integrilin, and nitroglycerin.      Evelene Croon, M.D.  Electronically Signed     BB/MEDQ  D:  02/15/2006  T:  02/15/2006  Job:  [id number]  cc:   [facility]

## 2010-12-10 NOTE — Assessment & Plan Note (Signed)
[facility]                              CARDIOLOGY OFFICE NOTE   NAME:Ian Hale                        MRN:          [id number]  DATE:06/08/2006                            DOB:          11-29-1934    PATIENT IDENTIFICATION:  Ian Hale is a delightful 76 year old man who  returns for routine followup.   PROBLEM LIST:  1. Coronary artery disease.      a.     Status post non-ST elevation myocardial infarction July 2007.      b.     Status post coronary bypass surgery by Dr. Laneta Simmers July 28. 2007.       Ejection fraction 50%.  2. Hypertension.  3. History of cleft palate, status post surgical repair.  4. Lower extremity edema.  5. Benign prostatic hypertrophy.   CURRENT MEDICATIONS:  1. Lipitor 80.  2. Aspirin 325.  3. Lisinopril 10 a day.  4. Lopressor 25 b.i.d.  5. Lumigan eye drops.  6. Niaspan 500 a day.  7. Hydrochlorothiazide 25 a day.  8. Potassium 20 mEq a day.   INTERVAL HISTORY:  Ian Hale returns today. He is doing great. He continues  with cardiac rehab. No chest pain or shortness of breath. He feels his  strength and stamina are improving. He continues to have some mild lower  extremity edema, but it has been improved since he started on  hydrochlorothiazide. He has had some problems with erectile dysfunction and  is asking for Viagra or Cialis today.   PHYSICAL EXAMINATION:  He is well-appearing in no acute distress. Ambulates  around the clinic without any difficulty. Blood pressure is 103/60, heart  rate is 63, respirations are unlabored, weight is 186.  HEENT: Sclera anicteric. Extra-ocular muscles intact. No xanthelasma. Mucus  membranes are moist. He does have a cleft palate repair. Oropharynx  otherwise clear. Neck is supple. No JVD. Carotids are 2+ bilaterally without  bruits. There is no lymphadenopathy or thyromegaly.  CARDIAC: Is a regular rate and a rhythm with an intermittent ectopic beat.  Also, intermittent  systolic ejection murmur. No rub or gallop.  LUNGS:  Are clear.  ABDOMEN: Soft, nontender, nondistended. No hepatosplenomegaly. No bruits. No  masses. Good bowel sounds.  EXTREMITIES: Are warm with no cyanosis, clubbing. There is 1+ edema  bilaterally. Good pulses.  NEURO: Alert and oriented x3. Cranial nerves II-XII are intact. Moves all 4  extremities without difficulty.   LABORATORY DATA:  Labs show normal liver function panel. Total cholesterol  of 84, triglycerides of 46, HDL of 30, LDL of 45.   ASSESSMENT/PLAN:  1. Coronary artery disease. This is very stable. Continue current medical      regimen.  2. Hypertension, well-controlled.  3. Hyperlipidemia. His LDL is at goal. His HDL is low. He is having some      mild itching with Niaspan, but will do our best to try to go up to 1000      mg a day. Will recheck lipids and liver panel in 3 months.   DISPOSITION:  Return to clinic  in 3 months.     Bevelyn Buckles. Bensimhon, MD  Electronically Signed    DRB/MedQ  DD: 06/08/2006  DT: 06/08/2006  Job #: [id number]

## 2011-09-27 ENCOUNTER — Other Ambulatory Visit (INDEPENDENT_AMBULATORY_CARE_PROVIDER_SITE_OTHER): Payer: PRIVATE HEALTH INSURANCE

## 2011-09-27 DIAGNOSIS — I10 Essential (primary) hypertension: Secondary | ICD-10-CM

## 2011-09-27 DIAGNOSIS — Z0000 Encounter for general adult medical examination without abnormal findings: Secondary | ICD-10-CM

## 2011-09-27 DIAGNOSIS — N400 Enlarged prostate without lower urinary tract symptoms: Secondary | ICD-10-CM

## 2011-09-27 LAB — LIPID PANEL
HDL: 33.1 mg/dL — ABNORMAL LOW (ref >39.00)
LDL Cholesterol: 73 mg/dL (ref 0–99)
Total CHOL/HDL Ratio: 4
Triglycerides: 102 mg/dL (ref 0.0–149.0)
VLDL: 20.4 mg/dL (ref 0.0–40.0)

## 2011-09-27 LAB — BASIC METABOLIC PANEL
CO2: 28 mEq/L (ref 19–32)
Calcium: 9.4 mg/dL (ref 8.4–10.5)
Creatinine, Ser: 1.6 mg/dL — ABNORMAL HIGH (ref 0.4–1.5)
GFR: 45 mL/min — ABNORMAL LOW (ref >60.00)
Glucose, Bld: 92 mg/dL (ref 70–99)
Sodium: 133 mEq/L — ABNORMAL LOW (ref 135–145)

## 2011-09-27 LAB — POCT URINALYSIS DIPSTICK
Nitrite, UA: NEGATIVE
Protein, UA: NEGATIVE
Urobilinogen, UA: 0.2
pH, UA: 5.5

## 2011-09-27 LAB — HEPATIC FUNCTION PANEL
AST: 19 U/L (ref 0–37)
Alkaline Phosphatase: 56 U/L (ref 39–117)
Bilirubin, Direct: 0.2 mg/dL (ref 0.0–0.3)

## 2011-09-27 LAB — CBC WITH DIFFERENTIAL/PLATELET
Basophils Absolute: 0 10*3/uL (ref 0.0–0.1)
Eosinophils Absolute: 0.3 10*3/uL (ref 0.0–0.7)
Lymphocytes Relative: 24.2 % (ref 12.0–46.0)
MCHC: 34.3 g/dL (ref 30.0–36.0)
Monocytes Relative: 8.9 % (ref 3.0–12.0)
Neutrophils Relative %: 61.6 % (ref 43.0–77.0)
RBC: 5.28 Mil/uL (ref 4.22–5.81)
RDW: 14.6 % (ref 11.5–14.6)

## 2011-10-04 ENCOUNTER — Encounter: Payer: Self-pay | Admitting: Internal Medicine

## 2011-10-04 ENCOUNTER — Ambulatory Visit (INDEPENDENT_AMBULATORY_CARE_PROVIDER_SITE_OTHER): Payer: PRIVATE HEALTH INSURANCE | Admitting: Family Medicine

## 2011-10-04 ENCOUNTER — Encounter: Payer: Self-pay | Admitting: Family Medicine

## 2011-10-04 DIAGNOSIS — Z23 Encounter for immunization: Secondary | ICD-10-CM

## 2011-10-04 DIAGNOSIS — I2510 Atherosclerotic heart disease of native coronary artery without angina pectoris: Secondary | ICD-10-CM

## 2011-10-04 DIAGNOSIS — I10 Essential (primary) hypertension: Secondary | ICD-10-CM

## 2011-10-04 DIAGNOSIS — J309 Allergic rhinitis, unspecified: Secondary | ICD-10-CM

## 2011-10-04 DIAGNOSIS — N400 Enlarged prostate without lower urinary tract symptoms: Secondary | ICD-10-CM

## 2011-10-04 DIAGNOSIS — Z0000 Encounter for general adult medical examination without abnormal findings: Secondary | ICD-10-CM

## 2011-10-04 DIAGNOSIS — N529 Male erectile dysfunction, unspecified: Secondary | ICD-10-CM

## 2011-10-04 DIAGNOSIS — E7800 Pure hypercholesterolemia, unspecified: Secondary | ICD-10-CM

## 2011-10-04 DIAGNOSIS — Z951 Presence of aortocoronary bypass graft: Secondary | ICD-10-CM

## 2011-10-04 DIAGNOSIS — R609 Edema, unspecified: Secondary | ICD-10-CM

## 2011-10-04 MED ORDER — ATORVASTATIN CALCIUM 80 MG PO TABS: 80.0000 mg | ORAL_TABLET | Freq: Every day | ORAL | Status: DC

## 2011-10-04 MED ORDER — NITROGLYCERIN 0.4 MG SL SUBL: 0.4000 mg | SUBLINGUAL_TABLET | SUBLINGUAL | Status: DC | PRN

## 2011-10-04 MED ORDER — LISINOPRIL 10 MG PO TABS: 10.0000 mg | ORAL_TABLET | Freq: Every day | ORAL | Status: DC

## 2011-10-04 MED ORDER — POTASSIUM CHLORIDE CRYS ER 20 MEQ PO TBCR: 20.0000 meq | EXTENDED_RELEASE_TABLET | Freq: Every day | ORAL | Status: DC

## 2011-10-04 MED ORDER — FUROSEMIDE 20 MG PO TABS: 20.0000 mg | ORAL_TABLET | Freq: Every day | ORAL | Status: DC

## 2011-10-04 MED ORDER — NIACIN ER (ANTIHYPERLIPIDEMIC) 1000 MG PO TBCR: 1000.0000 mg | EXTENDED_RELEASE_TABLET | Freq: Every day | ORAL | Status: DC

## 2011-10-04 MED ORDER — TADALAFIL 20 MG PO TABS: 20.0000 mg | ORAL_TABLET | Freq: Every day | ORAL | Status: DC | PRN

## 2011-10-04 MED ORDER — METOPROLOL TARTRATE 25 MG PO TABS: ORAL_TABLET | ORAL | Status: DC

## 2011-10-04 NOTE — Patient Instructions (Signed)
Continue your current medications except stop the hydrochlorothiazide  Return in one year sooner if any problems

## 2011-10-04 NOTE — Progress Notes (Signed)
Subjective:    Patient ID: Ian Hale, male    DOB: July 19, 1935, 77 y.o.   MRN: [id number]  HPI [place]ler is a 77 year old male nonsmoker who comes in today for a Medicare wellness examination because of a history of hyperlipidemia, peripheral edema, coronary disease, hypertension, BPH with outlet obstruction, erectile dysfunction  His medications reviewed in detail and there have been no changes  He gets routine eye care, hearing diminished but declines a hearing aid evaluation, regular dental care,,,,,,,,,,,,, upper bridge because of a history of cleft palate surgery as a child,,,,,,,,, colonoscopy and GI, tetanus 2003, Pneumovax x2, shingles 2010,  Cognitive function is normal he still works part-time as an Airline pilot he does not exercise on a regular basis, cognitive function again normal, home health safety reviewed no issues identified, no guns in the house, he does have a health care power of attorney and a living well  Here a cardiac stress test Spring 2012 normal he's due to go back to see Dr. Leonard Schwartz  in June for cardiac followup.   Review of Systems  Constitutional: Negative.   HENT: Negative.   Eyes: Negative.   Respiratory: Negative.   Cardiovascular: Negative.   Gastrointestinal: Negative.   Genitourinary: Negative.   Musculoskeletal: Negative.   Skin: Negative.   Neurological: Negative.   Hematological: Negative.   Psychiatric/Behavioral: Negative.        Objective:   Physical Exam  Constitutional: He is oriented to person, place, and time. He appears well-developed and well-nourished.  HENT:  Head: Normocephalic and atraumatic.  Right Ear: External ear normal.  Left Ear: External ear normal.  Nose: Nose normal.  Mouth/Throat: Oropharynx is clear and moist.       Upper dental appliance secondary to a history of cleft palate surgery as a child also scar left upper lip  Eyes: Conjunctivae and EOM are normal. Pupils are equal, round, and reactive to light.  Neck: Normal  range of motion. Neck supple. No JVD present. No tracheal deviation present. No thyromegaly present.  Cardiovascular: Normal rate, regular rhythm, normal heart sounds and intact distal pulses.  Exam reveals no gallop and no friction rub.   No murmur heard. Pulmonary/Chest: Effort normal and breath sounds normal. No stridor. No respiratory distress. He has no wheezes. He has no rales. He exhibits no tenderness.  Abdominal: Soft. Bowel sounds are normal. He exhibits no distension and no mass. There is no tenderness. There is no rebound and no guarding.  Genitourinary: Rectum normal and penis normal. Guaiac negative stool. No penile tenderness.       3+ symmetrical BPH  Musculoskeletal: Normal range of motion. He exhibits no edema and no tenderness.  Lymphadenopathy:    He has no cervical adenopathy.  Neurological: He is alert and oriented to person, place, and time. He has normal reflexes. No cranial nerve deficit. He exhibits normal muscle tone.  Skin: Skin is warm and dry. No rash noted. No erythema. No pallor.       Total body skin exam normal except for scar midsternum from previous bypass surgery  Psychiatric: He has a normal mood and affect. His behavior is normal. Judgment and thought content normal.          Assessment & Plan:  Healthy male  Hyperlipidemia continue Lipitor 80 mg daily  Peripheral edema continue Lasix 20 daily and hydrochlorothiazide 25 mg daily, hypertension continue lisinopril 10 mg daily along with Lopressor 12.5 mg twice a day  BPH continue polyp saw palmetto  History  of erectile dysfunction sialoliths when necessary  History of bypass surgery cardiac-wise asymptomatic no angina  History of cleft palate surgery as a child  Hearing loss

## 2011-12-09 ENCOUNTER — Other Ambulatory Visit: Payer: Self-pay | Admitting: *Deleted

## 2011-12-09 DIAGNOSIS — I10 Essential (primary) hypertension: Secondary | ICD-10-CM

## 2011-12-09 MED ORDER — POTASSIUM CHLORIDE CRYS ER 20 MEQ PO TBCR: 20.0000 meq | EXTENDED_RELEASE_TABLET | Freq: Every day | ORAL | Status: DC

## 2012-01-09 ENCOUNTER — Other Ambulatory Visit: Payer: Self-pay | Admitting: Family Medicine

## 2012-01-18 ENCOUNTER — Ambulatory Visit (INDEPENDENT_AMBULATORY_CARE_PROVIDER_SITE_OTHER): Payer: PRIVATE HEALTH INSURANCE | Admitting: Internal Medicine

## 2012-01-18 ENCOUNTER — Encounter: Payer: Self-pay | Admitting: Internal Medicine

## 2012-01-18 VITALS — BP 134/80 | HR 68 | Ht 70.0 in | Wt 228.0 lb

## 2012-01-18 DIAGNOSIS — E7800 Pure hypercholesterolemia, unspecified: Secondary | ICD-10-CM

## 2012-01-18 DIAGNOSIS — R609 Edema, unspecified: Secondary | ICD-10-CM

## 2012-01-18 DIAGNOSIS — I440 Atrioventricular block, first degree: Secondary | ICD-10-CM

## 2012-01-18 DIAGNOSIS — I2510 Atherosclerotic heart disease of native coronary artery without angina pectoris: Secondary | ICD-10-CM

## 2012-01-18 DIAGNOSIS — I10 Essential (primary) hypertension: Secondary | ICD-10-CM

## 2012-01-18 NOTE — Assessment & Plan Note (Signed)
Asymptomatic.  Continue to follow. 

## 2012-01-18 NOTE — Assessment & Plan Note (Signed)
Likely venous insufficiency. Encouraged him to wear compression hose.

## 2012-01-18 NOTE — Assessment & Plan Note (Signed)
BP looks good. Slightly elevated this am in setting of not taking meds this am prior to appt.

## 2012-01-18 NOTE — Progress Notes (Signed)
HPI:  Ian Hale is a delightful 77 year old male with history of coronary artery disease, status post non-ST-elevation myocardial infarction in July 2007, followed by bypass surgery. He also has a history of hypertension, hyperlipidemia with low HDL, CRI (1.5) and history of cleft palate status post repair.   He returns today for yearly followup. He is doing fairly well. Main complaint is arthritis pain in both knees. Has limited his walking program so not overly active. However still able to mow the lawn without angina symptoms.  Following closely with Dr. Tawanna Cooler. Recently had metoprolol cut in half and also stopped HCTZ (lasix continued) in March as SBP running 102-120. Since stopping SBP has remained about 115.   Recently switched from fish oil to flaxseed. Continues on Niacin.  Lab Results  Component Value Date   CHOL 126 09/27/2011   CHOL 144 09/14/2010   CHOL 125 09/10/2009   Lab Results  Component Value Date   HDL 33.10* 09/27/2011   HDL 91.47* 09/14/2010   HDL 35.20* 09/10/2009   Lab Results  Component Value Date   LDLCALC 73 09/27/2011   LDLCALC 91 09/14/2010   LDLCALC 60 09/10/2009   Lab Results  Component Value Date   TRIG 102.0 09/27/2011   TRIG 88.0 09/14/2010   TRIG 147.0 09/10/2009   Lab Results  Component Value Date   CHOLHDL 4 09/27/2011   CHOLHDL 4 09/14/2010   CHOLHDL 4 09/10/2009   No results found for this basename: LDLDIRECT    ROS: All systems negative except as listed in HPI, PMH and Problem List.  Past Medical History  Diagnosis Date  . Allergy   . CAD (coronary artery disease)   . Hyperlipidemia   . Hypertension   . BPH (benign prostatic hyperplasia)   . Hearing loss of both ears   . Glaucoma   . Cholesteatoma of left ear   . ED (erectile dysfunction)     Current Outpatient Prescriptions  Medication Sig Dispense Refill  . aspirin 81 MG tablet Take 81 mg by mouth daily.        Marland Kitchen atorvastatin (LIPITOR) 80 MG tablet Take 1 tablet (80 mg total) by mouth  daily.  100 tablet  3  . bimatoprost (LUMIGAN) 0.03 % ophthalmic drops 1 drop at bedtime.        . Flaxseed, Linseed, (FLAX SEEDS PO) Take 3 tablets by mouth daily.      . furosemide (LASIX) 20 MG tablet Take 1 tablet (20 mg total) by mouth daily.  100 tablet  3  . lisinopril (PRINIVIL,ZESTRIL) 10 MG tablet Take 1 tablet (10 mg total) by mouth daily.  100 tablet  3  . metoprolol tartrate (LOPRESSOR) 25 MG tablet One half tablet twice daily  100 tablet  3  . niacin (NIASPAN) 1000 MG CR tablet Take 2,000 mg by mouth at bedtime.      . nitroGLYCERIN (NITROSTAT) 0.4 MG SL tablet Place 1 tablet (0.4 mg total) under the tongue every 5 (five) minutes as needed.  25 tablet  1  . potassium chloride SA (K-DUR,KLOR-CON) 20 MEQ tablet Take 1 tablet (20 mEq total) by mouth daily.  100 tablet  3  . saw palmetto 160 MG capsule Take 160 mg by mouth 2 (two) times daily.        . tadalafil (CIALIS) 20 MG tablet Take 1 tablet (20 mg total) by mouth daily as needed.  6 tablet  11  . DISCONTD: niacin (NIASPAN) 1000 MG CR tablet Take 1 tablet (  1,000 mg total) by mouth at bedtime.  100 tablet  3  . DISCONTD: lisinopril (PRINIVIL,ZESTRIL) 10 MG tablet TAKE 1 TABLET BY MOUTH ONCE DAILY  100 tablet  3     PHYSICAL EXAM: Filed Vitals:   01/18/12 0830  BP: 134/80  Pulse: 68    General: well appearing. no resp difficulty  HEENT: normal. s/p cleft plate repair  Neck: supple. no JVD. Carotids 2+ bilat; no bruits.  Cor: PMI nondisplaced. Regular rate & rhythm. No rubs, gallops, murmur.  Lungs: clear  Abdomen: soft, nontender, nondistended. No hepatosplenomegaly. No bruits or masses. Good bowel sounds.  Extremities: no cyanosis, clubbing, rash, 1-2+ edema L > R  Neuro: alert & orientedx3, cranial nerves grossly intact. moves all 4 extremities w/o difficulty. affect pleasant   ECG: NSR 68 1AVB . No ST-T wave abnormalities.     ASSESSMENT & PLAN:

## 2012-01-18 NOTE — Patient Instructions (Addendum)
Your physician wants you to follow-up in: 1 year with Dr. Gala [facility]. You will receive a reminder letter in the mail two months in advance. If you don't receive a letter, please call our office to schedule the follow-up appointment.

## 2012-01-18 NOTE — Assessment & Plan Note (Signed)
No evidence of ischemia. Continue current regimen.   

## 2012-01-18 NOTE — Assessment & Plan Note (Signed)
Doing well LDL at goal. Will continue statin. Long talk about HDL and results of AIM-HIGH trial which showed no benefiti. Also discussed new CETP inhibitor trials. We discussed possible stopping Niacin and trying CETP inhibitor but he has decided to continue Niacin and focus on diet and weight loss.

## 2012-05-21 ENCOUNTER — Encounter: Payer: Self-pay | Admitting: Internal Medicine

## 2012-09-21 ENCOUNTER — Other Ambulatory Visit: Payer: Self-pay | Admitting: *Deleted

## 2012-09-21 DIAGNOSIS — I10 Essential (primary) hypertension: Secondary | ICD-10-CM

## 2012-09-21 DIAGNOSIS — I2510 Atherosclerotic heart disease of native coronary artery without angina pectoris: Secondary | ICD-10-CM

## 2012-09-21 MED ORDER — FUROSEMIDE 20 MG PO TABS: 20.0000 mg | ORAL_TABLET | Freq: Every day | ORAL | Status: DC

## 2012-09-21 MED ORDER — METOPROLOL TARTRATE 25 MG PO TABS: ORAL_TABLET | ORAL | Status: DC

## 2012-10-09 ENCOUNTER — Other Ambulatory Visit (INDEPENDENT_AMBULATORY_CARE_PROVIDER_SITE_OTHER): Payer: Medicare Other

## 2012-10-09 DIAGNOSIS — I10 Essential (primary) hypertension: Secondary | ICD-10-CM

## 2012-10-09 DIAGNOSIS — Z0000 Encounter for general adult medical examination without abnormal findings: Secondary | ICD-10-CM

## 2012-10-09 DIAGNOSIS — N400 Enlarged prostate without lower urinary tract symptoms: Secondary | ICD-10-CM

## 2012-10-09 DIAGNOSIS — E876 Hypokalemia: Secondary | ICD-10-CM

## 2012-10-09 DIAGNOSIS — E785 Hyperlipidemia, unspecified: Secondary | ICD-10-CM

## 2012-10-09 LAB — HEPATIC FUNCTION PANEL
AST: 21 U/L (ref 0–37)
Alkaline Phosphatase: 64 U/L (ref 39–117)
Bilirubin, Direct: 0.2 mg/dL (ref 0.0–0.3)
Total Bilirubin: 1.3 mg/dL — ABNORMAL HIGH (ref 0.3–1.2)

## 2012-10-09 LAB — LIPID PANEL
Cholesterol: 120 mg/dL (ref 0–200)
LDL Cholesterol: 75 mg/dL (ref 0–99)
Triglycerides: 80 mg/dL (ref 0.0–149.0)
VLDL: 16 mg/dL (ref 0.0–40.0)

## 2012-10-09 LAB — POCT URINALYSIS DIPSTICK
Glucose, UA: NEGATIVE
Ketones, UA: NEGATIVE
Spec Grav, UA: 1.02
Urobilinogen, UA: 0.2

## 2012-10-09 LAB — CBC WITH DIFFERENTIAL/PLATELET
Basophils Absolute: 0 10*3/uL (ref 0.0–0.1)
Lymphocytes Relative: 21.8 % (ref 12.0–46.0)
Monocytes Relative: 7.9 % (ref 3.0–12.0)
Platelets: 199 10*3/uL (ref 150.0–400.0)
RDW: 14.8 % — ABNORMAL HIGH (ref 11.5–14.6)

## 2012-10-09 LAB — BASIC METABOLIC PANEL
BUN: 26 mg/dL — ABNORMAL HIGH (ref 6–23)
Calcium: 9.2 mg/dL (ref 8.4–10.5)
GFR: 55.07 mL/min — ABNORMAL LOW (ref >60.00)
Glucose, Bld: 101 mg/dL — ABNORMAL HIGH (ref 70–99)
Sodium: 139 mEq/L (ref 135–145)

## 2012-10-16 ENCOUNTER — Encounter: Payer: Medicare Other | Admitting: Family Medicine

## 2012-10-23 ENCOUNTER — Encounter: Payer: Self-pay | Admitting: Family

## 2012-10-23 ENCOUNTER — Ambulatory Visit (INDEPENDENT_AMBULATORY_CARE_PROVIDER_SITE_OTHER): Payer: Medicare Other | Admitting: Family

## 2012-10-23 VITALS — BP 110/60 | HR 61 | Ht 69.25 in | Wt 217.0 lb

## 2012-10-23 DIAGNOSIS — Z1211 Encounter for screening for malignant neoplasm of colon: Secondary | ICD-10-CM

## 2012-10-23 DIAGNOSIS — N529 Male erectile dysfunction, unspecified: Secondary | ICD-10-CM

## 2012-10-23 DIAGNOSIS — Z0000 Encounter for general adult medical examination without abnormal findings: Secondary | ICD-10-CM

## 2012-10-23 DIAGNOSIS — I10 Essential (primary) hypertension: Secondary | ICD-10-CM

## 2012-10-23 DIAGNOSIS — E785 Hyperlipidemia, unspecified: Secondary | ICD-10-CM

## 2012-10-23 MED ORDER — TADALAFIL 20 MG PO TABS: 20.0000 mg | ORAL_TABLET | Freq: Every day | ORAL | Status: DC | PRN

## 2012-10-23 NOTE — Progress Notes (Signed)
Subjective:    Patient ID: Ian Hale, male    DOB: [birth year], 78 y.o.   MRN: [id number]  HPI Patient presents for yearly preventative medicine examination. All immunizations and health maintenance protocols were reviewed with the patient and they are up to date with these protocols. Screening laboratory values were reviewed with the patient including screening of hyperlipidemia PSA renal function and hepatic function. There medications past medical history social history problem list and allergies were reviewed in detail. Goals were established with regard to weight loss exercise diet in compliance with medications       Past Medical History  Diagnosis Date  . Allergy   . CAD (coronary artery disease)   . Hyperlipidemia   . Hypertension   . BPH (benign prostatic hyperplasia)   . Hearing loss of both ears   . Glaucoma   . Cholesteatoma of left ear   . ED (erectile dysfunction)     History   Social History  . Marital Status: Married    Spouse Name: N/A    Number of Children: N/A  . Years of Education: N/A   Occupational History  . Not on file.   Social History Main Topics  . Smoking status: Never Smoker   . Smokeless tobacco: Not on file  . Alcohol Use: No  . Drug Use: No  . Sexually Active:    Other Topics Concern  . Not on file   Social History Narrative  . No narrative on file    Past Surgical History  Procedure Laterality Date  . Cleft palate repair      Family History  Problem Relation Age of Onset  . Heart failure Mother   . Depression Father   . Heart failure Father     Not on File  Current Outpatient Prescriptions on File Prior to Visit  Medication Sig Dispense Refill  . aspirin 81 MG tablet Take 81 mg by mouth daily.        Marland Kitchen atorvastatin (LIPITOR) 80 MG tablet Take 1 tablet (80 mg total) by mouth daily.  100 tablet  3  . bimatoprost (LUMIGAN) 0.03 % ophthalmic drops 1 drop at bedtime.        . Flaxseed, Linseed, (FLAX SEEDS PO) Take 3  tablets by mouth daily.      . furosemide (LASIX) 20 MG tablet Take 1 tablet (20 mg total) by mouth daily.  100 tablet  0  . lisinopril (PRINIVIL,ZESTRIL) 10 MG tablet Take 1 tablet (10 mg total) by mouth daily.  100 tablet  3  . metoprolol tartrate (LOPRESSOR) 25 MG tablet One half tablet twice daily  100 tablet  0  . niacin (NIASPAN) 1000 MG CR tablet Take 2,000 mg by mouth at bedtime.      . nitroGLYCERIN (NITROSTAT) 0.4 MG SL tablet Place 1 tablet (0.4 mg total) under the tongue every 5 (five) minutes as needed.  25 tablet  1  . potassium chloride SA (K-DUR,KLOR-CON) 20 MEQ tablet Take 1 tablet (20 mEq total) by mouth daily.  100 tablet  3  . saw palmetto 160 MG capsule Take 160 mg by mouth 2 (two) times daily.        . tadalafil (CIALIS) 20 MG tablet Take 1 tablet (20 mg total) by mouth daily as needed.  6 tablet  11   No current facility-administered medications on file prior to visit.    There were no vitals taken for this visit.chart   Review of Systems  Constitutional: Negative.   HENT: Negative.   Eyes: Negative.   Respiratory: Negative.   Cardiovascular: Negative.   Gastrointestinal: Negative.   Endocrine: Negative.   Genitourinary: Negative.   Musculoskeletal: Negative.   Skin: Negative.   Allergic/Immunologic: Negative.   Neurological: Negative.   Hematological: Negative.   Psychiatric/Behavioral: Negative.        Objective:   Physical Exam  Constitutional: He is oriented to person, place, and time. He appears well-developed and well-nourished.  HENT:  Head: Normocephalic.  Right Ear: External ear normal.  Left Ear: External ear normal.  Nose: Nose normal.  Mouth/Throat: Oropharynx is clear and moist.  Eyes: Conjunctivae are normal. Pupils are equal, round, and reactive to light.  Neck: Normal range of motion. Neck supple. No thyromegaly present.  Cardiovascular: Normal rate, regular rhythm and normal heart sounds.   Pulmonary/Chest: Effort normal and breath  sounds normal.  Abdominal: Soft. Bowel sounds are normal. He exhibits no distension. There is no tenderness. There is no rebound.  Genitourinary: Rectum normal, prostate normal and penis normal.  Neurological: He is alert and oriented to person, place, and time. He displays normal reflexes. No cranial nerve deficit. Coordination normal.  Skin: Skin is warm and dry.  Psychiatric: He has a normal mood and affect.          Assessment & Plan:  Assessment: 1. CPX 2. Erectile Dysfunction 3. Hyperlipidemia 4. Hypokalemia 5. Hyperkalemia 6. Hypertension  Plan: Refer for colonoscopy. Decrease potassium to every other day. Healthy diet, exercise to help improve HDL. Recheck with Dr. Tawanna Cooler in 3-4 months and sooner as needed.

## 2012-10-23 NOTE — Patient Instructions (Signed)
Hyperkalemia Hyperkalemia is when you have too much potassium in your blood. This can be a life-threatening condition. Potassium is normally removed (excreted) from the body by the kidneys. CAUSES  The potassium level in your body can become too high for the following reasons:  You take in too much potassium. You can do this by:  Using salt substitutes. They contain large amounts of potassium.  Taking potassium supplements from your caregiver. The dose may be too high for you.  Eating foods or taking nutritional products with potassium.  You excrete too little potassium. This can happen if:  Your kidneys are not functioning properly. Kidney (renal) disease is a very common cause of hyperkalemia.  You are taking medicines that lower your excretion of potassium, such as certain diuretic medicines.  You have an adrenal gland disease called Addison's disease.  You have a urinary tract obstruction, such as kidney stones.  You are on treatment to mechanically clean your blood (dialysis) and you skip a treatment.  You release a high amount of potassium from your cells into your blood. You may have a condition that causes potassium to move from your cells to your bloodstream. This can happen with:  Injury to muscles or other tissues. Most potassium is stored in the muscles.  Severe burns or infections.  Acidic blood plasma (acidosis). Acidosis can result from many diseases, such as uncontrolled diabetes. SYMPTOMS  Usually, there are no symptoms unless the potassium is dangerously high or has risen very quickly. Symptoms may include:  Irregular or very slow heartbeat.  Feeling sick to your stomach (nauseous).  Tiredness (fatigue).  Nerve problems such as tingling of the skin, numbness of the hands or feet, weakness, or paralysis. DIAGNOSIS  A simple blood test can measure the amount of potassium in your body. An electrocardiogram test of the heart can also help make the diagnosis.  The heart may beat dangerously fast or slow down and stop beating with severe hyperkalemia.  TREATMENT  Treatment depends on how bad the condition is and on the underlying cause.  If the hyperkalemia is an emergency (causing heart problems or paralysis), many different medicines can be used alone or together to lower the potassium level briefly. This may include an insulin injection even if you are not diabetic. Emergency dialysis may be needed to remove potassium from the body.  If the hyperkalemia is less severe or dangerous, the underlying cause is treated. This can include taking medicines if needed. Your prescription medicines may be changed. You may also need to take a medicine to help your body get rid of potassium. You may need to eat a diet low in potassium. HOME CARE INSTRUCTIONS   Take medicines and supplements as directed by your caregiver.  Do not take any over-the-counter medicines, supplements, natural products, herbs, or vitamins without reviewing them with your caregiver. Certain supplements and natural food products can have high amounts of potassium. Other products (such as ibuprofen) can damage weak kidneys and raise your potassium.  You may be asked to do repeat lab tests. Be sure to follow these directions.  If you have kidney disease, you may need to follow a low potassium diet. SEEK MEDICAL CARE IF:   You notice an irregular or very slow heartbeat.  You feel lightheaded.  You develop weakness that is unusual for you. SEEK IMMEDIATE MEDICAL CARE IF:   You have shortness of breath.  You have chest discomfort.  You pass out (faint). MAKE SURE YOU:   Understand   these instructions.  Will watch your condition.  Will get help right away if you are not doing well or get worse. Document Released: 07/01/2002 Document Revised: 10/03/2011 Document Reviewed: 12/16/2010 [employer] Patient Information 2013 [employer].  

## 2012-10-24 ENCOUNTER — Encounter: Payer: Self-pay | Admitting: Internal Medicine

## 2012-11-08 ENCOUNTER — Telehealth: Payer: Self-pay | Admitting: Family Medicine

## 2012-11-08 DIAGNOSIS — I2510 Atherosclerotic heart disease of native coronary artery without angina pectoris: Secondary | ICD-10-CM

## 2012-11-08 MED ORDER — NITROGLYCERIN 0.4 MG SL SUBL: 0.4000 mg | SUBLINGUAL_TABLET | SUBLINGUAL | Status: DC | PRN

## 2012-11-08 NOTE — Telephone Encounter (Signed)
Rx sent to pharmacy   

## 2012-11-08 NOTE — Telephone Encounter (Signed)
Patient Information:  Caller Name: Jionni  Phone: [phone]  Patient: Ian Hale, Ian Hale  Gender: Male  DOB: 1934-10-01  Age: 78 Years  PCP: Kelle Darting [facility])  Office Follow Up:  Does the office need to follow up with this patient?: Yes  Instructions For The Office: Caller regarding Nitroglycerin Rx. has expired and pt requesting a new Rx. Thank you. [facility] on file verified.   Symptoms  Reason For Call & Symptoms: Caller regarding Nitroglycerin Rx. has expired and pt requesting a new Rx. Thank you. [facility] on file verified.  Recent physical 10/23/12.   No sx to report.  Reviewed Health History In EMR: Yes  Reviewed Medications In EMR: Yes  Reviewed Allergies In EMR: Yes  Reviewed Surgeries / Procedures: Yes  Date of Onset of Symptoms: 11/08/2012  Guideline(s) Used:  No Protocol Available - Information Only  Disposition Per Guideline:   Home Care  Reason For Disposition Reached:   Information only question and nurse able to answer  Advice Given:  N/A  Patient Will Follow Care Advice:  YES

## 2012-12-07 ENCOUNTER — Ambulatory Visit (AMBULATORY_SURGERY_CENTER): Payer: Medicare Other | Admitting: *Deleted

## 2012-12-07 VITALS — Ht 70.0 in | Wt 216.0 lb

## 2012-12-07 DIAGNOSIS — Z1211 Encounter for screening for malignant neoplasm of colon: Secondary | ICD-10-CM

## 2012-12-07 MED ORDER — MOVIPREP 100 G PO SOLR: ORAL | Status: DC

## 2012-12-10 ENCOUNTER — Encounter: Payer: Self-pay | Admitting: Internal Medicine

## 2012-12-21 ENCOUNTER — Encounter: Payer: Self-pay | Admitting: Internal Medicine

## 2012-12-21 ENCOUNTER — Ambulatory Visit (AMBULATORY_SURGERY_CENTER): Payer: Medicare Other | Admitting: Internal Medicine

## 2012-12-21 ENCOUNTER — Other Ambulatory Visit: Payer: Self-pay | Admitting: *Deleted

## 2012-12-21 VITALS — BP 125/76 | HR 52 | Temp 98.0°F | Resp 14 | Ht 70.0 in | Wt 216.0 lb

## 2012-12-21 DIAGNOSIS — E7800 Pure hypercholesterolemia, unspecified: Secondary | ICD-10-CM

## 2012-12-21 DIAGNOSIS — Z1211 Encounter for screening for malignant neoplasm of colon: Secondary | ICD-10-CM

## 2012-12-21 MED ORDER — ATORVASTATIN CALCIUM 80 MG PO TABS: 80.0000 mg | ORAL_TABLET | Freq: Every day | ORAL | Status: DC

## 2012-12-21 MED ORDER — SODIUM CHLORIDE 0.9 % IV SOLN: 500.0000 mL | INTRAVENOUS | Status: DC

## 2012-12-21 NOTE — Progress Notes (Signed)
Patient did not have preoperative order for IV antibiotic SSI prophylaxis. (G8918)  Patient did not experience any of the following events: a burn prior to discharge; a fall within the facility; wrong site/side/patient/procedure/implant event; or a hospital transfer or hospital admission upon discharge from the facility. (G8907)  

## 2012-12-21 NOTE — Progress Notes (Signed)
Procedure ends, to recovery awake, report given and VSS. 

## 2012-12-21 NOTE — Patient Instructions (Addendum)
Impressions/recommendations:  Small internal hemorrhoids (handout given) Diverticulosis (handout given)  High Fiber diet (handout given)  YOU HAD AN ENDOSCOPIC PROCEDURE TODAY AT THE [facility]: Refer to the procedure report that was given to you for any specific questions about what was found during the examination.  If the procedure report does not answer your questions, please call your gastroenterologist to clarify.  If you requested that your care partner not be given the details of your procedure findings, then the procedure report has been included in a sealed envelope for you to review at your convenience later.  YOU SHOULD EXPECT: Some feelings of bloating in the abdomen. Passage of more gas than usual.  Walking can help get rid of the air that was put into your GI tract during the procedure and reduce the bloating. If you had a lower endoscopy (such as a colonoscopy or flexible sigmoidoscopy) you may notice spotting of blood in your stool or on the toilet paper. If you underwent a bowel prep for your procedure, then you may not have a normal bowel movement for a few days.  DIET: Your first meal following the procedure should be a light meal and then it is ok to progress to your normal diet.  A half-sandwich or bowl of soup is an example of a good first meal.  Heavy or fried foods are harder to digest and may make you feel nauseous or bloated.  Likewise meals heavy in dairy and vegetables can cause extra gas to form and this can also increase the bloating.  Drink plenty of fluids but you should avoid alcoholic beverages for 24 hours.  ACTIVITY: Your care partner should take you home directly after the procedure.  You should plan to take it easy, moving slowly for the rest of the day.  You can resume normal activity the day after the procedure however you should NOT DRIVE or use heavy machinery for 24 hours (because of the sedation medicines used during the test).    SYMPTOMS TO  REPORT IMMEDIATELY: A gastroenterologist can be reached at any hour.  During normal business hours, 8:30 AM to 5:00 PM Monday through Friday, call [phone].  After hours and on weekends, please call the GI answering service at [phone] who will take a message and have the physician on call contact you.   Following lower endoscopy (colonoscopy or flexible sigmoidoscopy):  Excessive amounts of blood in the stool  Significant tenderness or worsening of abdominal pains  Swelling of the abdomen that is new, acute  Fever of 100F or higher  FOLLOW UP: If any biopsies were taken you will be contacted by phone or by letter within the next 1-3 weeks.  Call your gastroenterologist if you have not heard about the biopsies in 3 weeks.  Our staff will call the home number listed on your records the next business day following your procedure to check on you and address any questions or concerns that you may have at that time regarding the information given to you following your procedure. This is a courtesy call and so if there is no answer at the home number and we have not heard from you through the emergency physician on call, we will assume that you have returned to your regular daily activities without incident.  SIGNATURES/CONFIDENTIALITY: You and/or your care partner have signed paperwork which will be entered into your electronic medical record.  These signatures attest to the fact that that the information above on your  After Visit Summary has been reviewed and is understood.  Full responsibility of the confidentiality of this discharge information lies with you and/or your care-partner.

## 2012-12-21 NOTE — Op Note (Signed)
[facility] [address] [place], [address]   COLONOSCOPY PROCEDURE REPORT  PATIENT: Ian Hale, Ian Hale  MR#: [id number] BIRTHDATE: Mar 19, 1935 , 78  yrs. old GENDER: Male ENDOSCOPIST: Hart Carwin, MD REFERRED BY:  Adline Mango, FNP-BC PROCEDURE DATE:  12/21/2012 PROCEDURE:   Colonoscopy, screening ASA CLASS:   Class II INDICATIONS:Average risk patient for colon cancer and last colonoscopy was in 2003- normal. MEDICATIONS: MAC sedation, administered by CRNA and propofol (Diprivan) 200mg  IV  DESCRIPTION OF PROCEDURE:   After the risks and benefits and of the procedure were explained, informed consent was obtained.  A digital rectal exam revealed no abnormalities of the rectum.    The LB [id number]  endoscope was introduced through the anus and advanced to the cecum, which was identified by both the appendix and ileocecal valve .  The quality of the prep was good, using MoviPrep .  The instrument was then slowly withdrawn as the colon was fully examined.     COLON FINDINGS: Small internal hemorrhoids were found. Retroflexed views revealed no abnormalities.     The scope was then withdrawn from the patient and the procedure completed.  COMPLICATIONS: There were no complications. ENDOSCOPIC IMPRESSION: Small internal hemorrhoids mild diverticulosis of the sigmoid colon  RECOMMENDATIONS: High fiber diet   REPEAT EXAM: for No recall due to age..  cc:  _______________________________ eSignedHart Carwin, MD 12/21/2012 11:28 AM

## 2012-12-24 ENCOUNTER — Telehealth: Payer: Self-pay

## 2012-12-24 NOTE — Telephone Encounter (Signed)
  Follow up Call-  Call back number 12/21/2012  Post procedure Call Back phone  # [phone]  Permission to leave phone message Yes     Patient questions:  Do you have a fever, pain , or abdominal swelling? no Pain Score  0 *  Have you tolerated food without any problems? yes  Have you been able to return to your normal activities? yes  Do you have any questions about your discharge instructions: Diet   no Medications  no Follow up visit  no  Do you have questions or concerns about your Care? no  Actions: * If pain score is 4 or above: No action needed, pain <4.

## 2013-01-17 ENCOUNTER — Other Ambulatory Visit: Payer: Self-pay | Admitting: Family Medicine

## 2013-01-24 ENCOUNTER — Other Ambulatory Visit: Payer: Self-pay | Admitting: Family Medicine

## 2013-02-01 ENCOUNTER — Other Ambulatory Visit: Payer: Self-pay | Admitting: Family Medicine

## 2013-04-04 ENCOUNTER — Other Ambulatory Visit: Payer: Self-pay | Admitting: Family Medicine

## 2013-05-09 ENCOUNTER — Other Ambulatory Visit: Payer: Self-pay | Admitting: Family Medicine

## 2013-06-19 ENCOUNTER — Other Ambulatory Visit: Payer: Self-pay | Admitting: Family Medicine

## 2013-09-09 ENCOUNTER — Other Ambulatory Visit: Payer: Self-pay | Admitting: Family Medicine

## 2013-09-22 ENCOUNTER — Other Ambulatory Visit: Payer: Self-pay | Admitting: Family Medicine

## 2013-10-24 ENCOUNTER — Other Ambulatory Visit: Payer: Medicare Other

## 2013-10-29 ENCOUNTER — Encounter: Payer: Self-pay | Admitting: Family Medicine

## 2013-10-29 ENCOUNTER — Ambulatory Visit (INDEPENDENT_AMBULATORY_CARE_PROVIDER_SITE_OTHER): Payer: Medicare Other | Admitting: Family Medicine

## 2013-10-29 VITALS — BP 130/68 | HR 61 | Temp 97.7°F | Ht 70.0 in | Wt 227.0 lb

## 2013-10-29 DIAGNOSIS — R609 Edema, unspecified: Secondary | ICD-10-CM

## 2013-10-29 DIAGNOSIS — I2510 Atherosclerotic heart disease of native coronary artery without angina pectoris: Secondary | ICD-10-CM

## 2013-10-29 DIAGNOSIS — Z0000 Encounter for general adult medical examination without abnormal findings: Secondary | ICD-10-CM

## 2013-10-29 DIAGNOSIS — E7800 Pure hypercholesterolemia, unspecified: Secondary | ICD-10-CM

## 2013-10-29 DIAGNOSIS — Z23 Encounter for immunization: Secondary | ICD-10-CM

## 2013-10-29 DIAGNOSIS — I10 Essential (primary) hypertension: Secondary | ICD-10-CM

## 2013-10-29 DIAGNOSIS — J309 Allergic rhinitis, unspecified: Secondary | ICD-10-CM

## 2013-10-29 DIAGNOSIS — N400 Enlarged prostate without lower urinary tract symptoms: Secondary | ICD-10-CM

## 2013-10-29 DIAGNOSIS — Z951 Presence of aortocoronary bypass graft: Secondary | ICD-10-CM

## 2013-10-29 DIAGNOSIS — E785 Hyperlipidemia, unspecified: Secondary | ICD-10-CM

## 2013-10-29 LAB — HEPATIC FUNCTION PANEL
ALT: 27 U/L (ref 0–53)
AST: 23 U/L (ref 0–37)
Albumin: 3.8 g/dL (ref 3.5–5.2)
Alkaline Phosphatase: 69 U/L (ref 39–117)
Bilirubin, Direct: 0.3 mg/dL (ref 0.0–0.3)
TOTAL PROTEIN: 6.7 g/dL (ref 6.0–8.3)
Total Bilirubin: 2.2 mg/dL — ABNORMAL HIGH (ref 0.3–1.2)

## 2013-10-29 LAB — LIPID PANEL
CHOLESTEROL: 123 mg/dL (ref 0–200)
HDL: 32.6 mg/dL — ABNORMAL LOW (ref >39.00)
LDL CALC: 75 mg/dL (ref 0–99)
TRIGLYCERIDES: 77 mg/dL (ref 0.0–149.0)
Total CHOL/HDL Ratio: 4
VLDL: 15.4 mg/dL (ref 0.0–40.0)

## 2013-10-29 LAB — POCT URINALYSIS DIPSTICK
Bilirubin, UA: NEGATIVE
Blood, UA: NEGATIVE
Glucose, UA: NEGATIVE
Ketones, UA: NEGATIVE
LEUKOCYTES UA: NEGATIVE
NITRITE UA: NEGATIVE
PROTEIN UA: NEGATIVE
Spec Grav, UA: 1.01
UROBILINOGEN UA: 0.2
pH, UA: 5.5

## 2013-10-29 LAB — CBC WITH DIFFERENTIAL/PLATELET
Basophils Absolute: 0 10*3/uL (ref 0.0–0.1)
Basophils Relative: 0.5 % (ref 0.0–3.0)
EOS ABS: 0.2 10*3/uL (ref 0.0–0.7)
Eosinophils Relative: 2.8 % (ref 0.0–5.0)
HCT: 48.2 % (ref 39.0–52.0)
Hemoglobin: 16.3 g/dL (ref 13.0–17.0)
Lymphocytes Relative: 22.5 % (ref 12.0–46.0)
Lymphs Abs: 1.8 10*3/uL (ref 0.7–4.0)
MCHC: 33.8 g/dL (ref 30.0–36.0)
MCV: 91.9 fl (ref 78.0–100.0)
MONO ABS: 0.8 10*3/uL (ref 0.1–1.0)
Monocytes Relative: 9.9 % (ref 3.0–12.0)
NEUTROS PCT: 64.3 % (ref 43.0–77.0)
Neutro Abs: 5 10*3/uL (ref 1.4–7.7)
PLATELETS: 218 10*3/uL (ref 150.0–400.0)
RBC: 5.25 Mil/uL (ref 4.22–5.81)
RDW: 14.7 % — ABNORMAL HIGH (ref 11.5–14.6)
WBC: 7.9 10*3/uL (ref 4.5–10.5)

## 2013-10-29 LAB — BASIC METABOLIC PANEL
BUN: 27 mg/dL — ABNORMAL HIGH (ref 6–23)
CALCIUM: 9.3 mg/dL (ref 8.4–10.5)
CO2: 26 mEq/L (ref 19–32)
CREATININE: 1.5 mg/dL (ref 0.4–1.5)
Chloride: 103 mEq/L (ref 96–112)
GFR: 46.77 mL/min — AB (ref >60.00)
Glucose, Bld: 95 mg/dL (ref 70–99)
Potassium: 4.5 mEq/L (ref 3.5–5.1)
SODIUM: 135 meq/L (ref 135–145)

## 2013-10-29 LAB — PSA: PSA: 4.38 ng/mL — ABNORMAL HIGH (ref 0.10–4.00)

## 2013-10-29 LAB — TSH: TSH: 1.8 u[IU]/mL (ref 0.35–5.50)

## 2013-10-29 MED ORDER — FUROSEMIDE 20 MG PO TABS: ORAL_TABLET | ORAL | Status: DC

## 2013-10-29 MED ORDER — LISINOPRIL 10 MG PO TABS: 10.0000 mg | ORAL_TABLET | Freq: Every day | ORAL | Status: DC

## 2013-10-29 MED ORDER — METOPROLOL TARTRATE 25 MG PO TABS: ORAL_TABLET | ORAL | Status: DC

## 2013-10-29 MED ORDER — ATORVASTATIN CALCIUM 80 MG PO TABS: 80.0000 mg | ORAL_TABLET | Freq: Every day | ORAL | Status: DC

## 2013-10-29 MED ORDER — NITROGLYCERIN 0.4 MG SL SUBL: 0.4000 mg | SUBLINGUAL_TABLET | SUBLINGUAL | Status: DC | PRN

## 2013-10-29 MED ORDER — POTASSIUM CHLORIDE CRYS ER 20 MEQ PO TBCR: EXTENDED_RELEASE_TABLET | ORAL | Status: DC

## 2013-10-29 NOTE — Patient Instructions (Signed)
Continue your current medications  Continue good exercise program  Followup in 1 year sooner if any problems

## 2013-10-29 NOTE — Addendum Note (Signed)
Addended by: Shelby DubinFLOYD, Ruthann Angulo E on: 10/29/2013 11:30 AM   Modules accepted: Orders

## 2013-10-29 NOTE — Progress Notes (Signed)
Pre visit review using our clinic review tool, if applicable. No additional management support is needed unless otherwise documented below in the visit note. 

## 2013-10-29 NOTE — Progress Notes (Signed)
   Subjective:    Patient ID: Ian Hale, male    DOB: 10/29/1934, 79 y.o.   MRN: [id number]  HPI Molly MaduroRobert is a 79 year old male nonsmoker who comes in today for a Medicare wellness examination because of an underlying history of hyperlipidemia, coronary disease, status post bypass surgery, hypertension, BPH,  He states he's had a good year no major problems. He walks 3 days a week. He continues to work a half a day 5 days per week.  Cognitive function normal he walks on a regular basis home health safety reviewed no issues identified, no guns in the house, he does have a health care power of attorney and living will.  He gets routine eye care, dental care, colonoscopy last year at age 79 normal therefore no more colonoscopies indicated.   Review of Systems  Constitutional: Negative.   HENT: Negative.   Eyes: Negative.   Respiratory: Negative.   Cardiovascular: Negative.   Gastrointestinal: Negative.   Genitourinary: Negative.   Musculoskeletal: Negative.   Skin: Negative.   Neurological: Negative.   Psychiatric/Behavioral: Negative.        Objective:   Physical Exam  Nursing note and vitals reviewed. Constitutional: He is oriented to person, place, and time. He appears well-developed and well-nourished.  HENT:  Head: Normocephalic and atraumatic.  Right Ear: External ear normal.  Left Ear: External ear normal.  Nose: Nose normal.  Mouth/Throat: Oropharynx is clear and moist.  Upper and lower dental appliances,,,,,, congenital facial deformity  Eyes: Conjunctivae and EOM are normal. Pupils are equal, round, and reactive to light. Right eye exhibits no discharge. Left eye exhibits no discharge. No scleral icterus.  Neck: Normal range of motion. Neck supple. No JVD present. No tracheal deviation present. No thyromegaly present.  Cardiovascular: Normal rate, regular rhythm, normal heart sounds and intact distal pulses.  Exam reveals no gallop and no friction rub.   No murmur  heard. No carotid to aortic bruits peripheral pulses 2+ and symmetrical  Pulmonary/Chest: Effort normal and breath sounds normal. No stridor. No respiratory distress. He has no wheezes. He has no rales. He exhibits no tenderness.  Midline scar from previous bypass surgery  Abdominal: Soft. Bowel sounds are normal. He exhibits no distension and no mass. There is no tenderness. There is no rebound and no guarding.  Genitourinary: Rectum normal and penis normal. Guaiac negative stool. No penile tenderness.  2+ symmetrical BPH  Musculoskeletal: Normal range of motion. He exhibits no edema and no tenderness.  Lymphadenopathy:    He has no cervical adenopathy.  Neurological: He is alert and oriented to person, place, and time. He has normal reflexes. No cranial nerve deficit. He exhibits normal muscle tone.  Skin: Skin is warm and dry. No rash noted. No erythema. No pallor.  Psychiatric: He has a normal mood and affect. His behavior is normal. Judgment and thought content normal.          Assessment & Plan:  Hyperlipidemia check labs  Coronary disease asymptomatic  Hypertension at goal continue current therapy  History of bypass surgery heart wise asymptomatic no angina  Congenital oral deformity,,,,,, previous surgery and appliances,,,,,, followed by his dentist

## 2013-10-30 ENCOUNTER — Telehealth: Payer: Self-pay | Admitting: Family Medicine

## 2013-10-30 NOTE — Telephone Encounter (Signed)
Relevant patient education mailed to patient.  

## 2014-01-24 ENCOUNTER — Other Ambulatory Visit: Payer: Self-pay | Admitting: Family Medicine

## 2014-02-10 ENCOUNTER — Other Ambulatory Visit: Payer: Self-pay | Admitting: Family Medicine

## 2014-06-06 ENCOUNTER — Encounter: Payer: Self-pay | Admitting: Internal Medicine

## 2014-11-26 ENCOUNTER — Other Ambulatory Visit: Payer: Self-pay | Admitting: Family Medicine

## 2014-12-04 ENCOUNTER — Other Ambulatory Visit: Payer: Self-pay | Admitting: Family Medicine

## 2015-01-01 DIAGNOSIS — H5213 Myopia, bilateral: Secondary | ICD-10-CM | POA: Diagnosis not present

## 2015-01-01 DIAGNOSIS — H4011X1 Primary open-angle glaucoma, mild stage: Secondary | ICD-10-CM | POA: Diagnosis not present

## 2015-01-12 ENCOUNTER — Other Ambulatory Visit: Payer: Self-pay | Admitting: Family Medicine

## 2015-01-18 ENCOUNTER — Other Ambulatory Visit: Payer: Self-pay | Admitting: Family Medicine

## 2015-02-11 DIAGNOSIS — H4011X1 Primary open-angle glaucoma, mild stage: Secondary | ICD-10-CM | POA: Diagnosis not present

## 2015-02-23 ENCOUNTER — Ambulatory Visit (INDEPENDENT_AMBULATORY_CARE_PROVIDER_SITE_OTHER): Payer: Medicare Other | Admitting: Adult Health

## 2015-02-23 ENCOUNTER — Encounter: Payer: Self-pay | Admitting: Adult Health

## 2015-02-23 VITALS — BP 104/68 | Temp 98.1°F | Ht 69.0 in | Wt 219.1 lb

## 2015-02-23 DIAGNOSIS — E785 Hyperlipidemia, unspecified: Secondary | ICD-10-CM | POA: Diagnosis not present

## 2015-02-23 DIAGNOSIS — Z0000 Encounter for general adult medical examination without abnormal findings: Secondary | ICD-10-CM | POA: Diagnosis not present

## 2015-02-23 DIAGNOSIS — I10 Essential (primary) hypertension: Secondary | ICD-10-CM

## 2015-02-23 DIAGNOSIS — Z125 Encounter for screening for malignant neoplasm of prostate: Secondary | ICD-10-CM

## 2015-02-23 LAB — POCT URINALYSIS DIPSTICK
Bilirubin, UA: NEGATIVE
Blood, UA: NEGATIVE
Glucose, UA: NEGATIVE
Ketones, UA: NEGATIVE
Leukocytes, UA: NEGATIVE
NITRITE UA: NEGATIVE
Protein, UA: NEGATIVE
Spec Grav, UA: 1.01
Urobilinogen, UA: 0.2
pH, UA: 5.5

## 2015-02-23 LAB — LIPID PANEL
Cholesterol: 127 mg/dL (ref 0–200)
HDL: 38.6 mg/dL — ABNORMAL LOW (ref >39.00)
LDL CALC: 65 mg/dL (ref 0–99)
NONHDL: 88.34
TRIGLYCERIDES: 116 mg/dL (ref 0.0–149.0)
Total CHOL/HDL Ratio: 3
VLDL: 23.2 mg/dL (ref 0.0–40.0)

## 2015-02-23 LAB — CBC WITH DIFFERENTIAL/PLATELET
Basophils Absolute: 0 10*3/uL (ref 0.0–0.1)
Basophils Relative: 0.5 % (ref 0.0–3.0)
EOS ABS: 0.3 10*3/uL (ref 0.0–0.7)
EOS PCT: 3.3 % (ref 0.0–5.0)
HEMATOCRIT: 49.3 % (ref 39.0–52.0)
Hemoglobin: 16.5 g/dL (ref 13.0–17.0)
Lymphocytes Relative: 21.3 % (ref 12.0–46.0)
Lymphs Abs: 1.7 10*3/uL (ref 0.7–4.0)
MCHC: 33.5 g/dL (ref 30.0–36.0)
MCV: 92.3 fl (ref 78.0–100.0)
MONOS PCT: 9.2 % (ref 3.0–12.0)
Monocytes Absolute: 0.7 10*3/uL (ref 0.1–1.0)
Neutro Abs: 5.2 10*3/uL (ref 1.4–7.7)
Neutrophils Relative %: 65.7 % (ref 43.0–77.0)
Platelets: 214 10*3/uL (ref 150.0–400.0)
RBC: 5.34 Mil/uL (ref 4.22–5.81)
RDW: 15.1 % (ref 11.5–15.5)
WBC: 7.9 10*3/uL (ref 4.0–10.5)

## 2015-02-23 LAB — HEPATIC FUNCTION PANEL
ALBUMIN: 4.3 g/dL (ref 3.5–5.2)
ALK PHOS: 62 U/L (ref 39–117)
ALT: 20 U/L (ref 0–53)
AST: 17 U/L (ref 0–37)
Bilirubin, Direct: 0.3 mg/dL (ref 0.0–0.3)
Total Bilirubin: 1.5 mg/dL — ABNORMAL HIGH (ref 0.2–1.2)
Total Protein: 7 g/dL (ref 6.0–8.3)

## 2015-02-23 LAB — BASIC METABOLIC PANEL
BUN: 25 mg/dL — ABNORMAL HIGH (ref 6–23)
CHLORIDE: 106 meq/L (ref 96–112)
CO2: 30 mEq/L (ref 19–32)
CREATININE: 1.59 mg/dL — AB (ref 0.40–1.50)
Calcium: 9.6 mg/dL (ref 8.4–10.5)
GFR: 44.92 mL/min — ABNORMAL LOW (ref >60.00)
Glucose, Bld: 96 mg/dL (ref 70–99)
POTASSIUM: 5.3 meq/L — AB (ref 3.5–5.1)
Sodium: 145 mEq/L (ref 135–145)

## 2015-02-23 LAB — PSA: PSA: 4.91 ng/mL — ABNORMAL HIGH (ref 0.10–4.00)

## 2015-02-23 LAB — TSH: TSH: 1.45 u[IU]/mL (ref 0.35–4.50)

## 2015-02-23 NOTE — Progress Notes (Signed)
Pre visit review using our clinic review tool, if applicable. No additional management support is needed unless otherwise documented below in the visit note. 

## 2015-02-23 NOTE — Progress Notes (Addendum)
Subjective:  Ian Hale is a 80 year old male nonsmoker who comes in today for a Medicare wellness examination because of an underlying history of hyperlipidemia, coronary disease, status post bypass surgery, hypertension, BPH,  He states he's had a good year no major problems. He walks 3 days a week. He continues to work a half a day 5 days per week.  Cognitive function normal he walks on a regular basis home health safety reviewed no issues identified, no guns in the house, he does have a health care power of attorney and living will.  He gets routine eye care, dental care, colonoscopy last year at age 8 normal therefore no more colonoscopies indicated.  Preventive Screening-Counseling & Management  Smoking Status: Never Smoker Second Hand Smoking status: No smokers in home  Risk Factors Regular exercise: Yes Diet: Endorses eating healthy.  Fall Risk:None   Cardiac risk factors:  advanced age (older than 33 for men, 58 for women) Yes Hyperlipidemia Yes No diabetes. No Family History: Yes  Depression Screen None. PHQ2 0   Activities of Daily Living Independent ADLs and IADLs   Hearing Difficulties: -patient declines  Cognitive Testing No reported trouble.   Normal 3 word recall  List the Names of Other Physician/Practitioners you currently use: 1.Optho: Dr. Lorin Picket- Has an appointment on 03/01/2015  Immunization History  Administered Date(s) Administered  . Influenza Whole 07/31/2007  . Influenza-Unspecified 06/24/2013  . Pneumococcal Conjugate-13 10/29/2013  . Pneumococcal Polysaccharide-23 07/25/2001, 07/31/2007  . Td 07/25/2001  . Tdap 10/04/2011  . Zoster 07/31/2008   Required Immunizations needed today UTD  Screening tests- up to date Health Maintenance Due  Topic Date Due  . INFLUENZA VACCINE  02/23/2015  - He does not want to have a hearing test done  ROS- No pertinent positives discovered in course of AWV  The following were reviewed and  entered/updated in epic: Past Medical History  Diagnosis Date  . Allergy   . CAD (coronary artery disease)   . Hyperlipidemia   . Hypertension   . BPH (benign prostatic hyperplasia)   . Hearing loss of both ears   . Glaucoma   . Cholesteatoma of left ear   . ED (erectile dysfunction)    Patient Active Problem List   Diagnosis Date Noted  . First degree AV block 01/18/2012  . PURE HYPERCHOLESTEROLEMIA 02/18/2009  . EDEMA 02/17/2009  . HYPERTENSION 07/31/2007  . CORONARY ARTERY DISEASE 07/31/2007  . ALLERGIC RHINITIS 07/31/2007  . BENIGN PROSTATIC HYPERTROPHY 07/31/2007  . CORONARY ARTERY BYPASS GRAFT, HX OF 07/31/2007   Past Surgical History  Procedure Laterality Date  . 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 Coronary artery bypass graft  01/2006    Family History  Problem Relation Age of Onset  . Heart failure Mother   . Depression Father   . Heart failure Father     Medications- reviewed and updated Current Outpatient Prescriptions  Medication Sig Dispense Refill  . aspirin 81 MG tablet Take 81 mg by mouth daily.      Marland Kitchen atorvastatin (LIPITOR) 80 MG tablet TAKE 1 TABLET BY MOUTH DAILY 100 tablet 1  . atorvastatin (LIPITOR) 80 MG tablet TAKE 1 TABLET BY MOUTH EVERY DAY 30 tablet 0  . bimatoprost (LUMIGAN) 0.01 % SOLN Place 1 drop into both eyes at bedtime.    . furosemide (LASIX) 20 MG tablet TAKE 1 TABLET BY MOUTH EVERY DAY 100 tablet 0  . lisinopril (PRINIVIL,ZESTRIL) 10 MG tablet Take 1 tablet (10 mg total)  by mouth daily. 100 tablet 3  . lisinopril (PRINIVIL,ZESTRIL) 10 MG tablet TAKE 1 TABLET BY MOUTH ONCE DAILY 100 tablet 1  . metoprolol tartrate (LOPRESSOR) 25 MG tablet TAKE 1/2 TABLET BY MOUTH TWICE DAILY 90 tablet 0  . nitroGLYCERIN (NITROSTAT) 0.4 MG SL tablet Place 1 tablet (0.4 mg total) under the tongue every 5 (five) minutes as needed. 25 tablet 1  . potassium chloride SA (K-DUR,KLOR-CON) 20 MEQ tablet TAKE 1/2 TABLET BY MOUTH EVERY DAY 30 tablet 0  . saw  palmetto 160 MG capsule Take 160 mg by mouth 2 (two) times daily.       No current facility-administered medications for this visit.    Allergies-reviewed and updated No Known Allergies  History   Social History  . Marital Status: Married    Spouse Name: N/A  . Number of Children: N/A  . Years of Education: N/A   Social History Main Topics  . Smoking status: Never Smoker   . Smokeless tobacco: Never Used  . Alcohol Use: No  . Drug Use: No  . Sexual Activity: Not on file   Other Topics Concern  . None   Social History Narrative    Objective: Temp(Src) 98.1 F (36.7 C) (Oral)  Ht  (1.753 m)  Wt 219 lb 1.6 oz (99.383 kg)  BMI 32.34 kg/m2 Nursing note and vitals reviewed. Constitutional: He is oriented to person, place, and time. He appears well-developed and well-nourished.  HENT:  Head: Normocephalic and atraumatic.  Right Ear: External ear normal.  Left Ear: External ear normal.  Nose: Nose normal.  Mouth/Throat: Oropharynx is clear and moist.  Upper and lower dental appliances,,,,,, congenital facial deformity  Eyes: Conjunctivae and EOM are normal. Pupils are equal, round, and reactive to light. Right eye exhibits no discharge. Left eye exhibits no discharge. No scleral icterus.  Neck: Normal range of motion. Neck supple. No JVD present. No tracheal deviation present. No thyromegaly present.  Cardiovascular: Normal rate, regular rhythm, normal heart sounds and intact distal pulses. Exam reveals no gallop and no friction rub.  No murmur heard. No carotid to aortic bruits peripheral pulses 2+ and symmetrical  Pulmonary/Chest: Effort normal and breath sounds normal. No stridor. No respiratory distress. He has no wheezes. He has no rales. He exhibits no tenderness.  Midline scar from previous bypass surgery  Abdominal: Soft. Bowel sounds are normal. He exhibits no distension and no mass. There is no tenderness. There is no rebound and no guarding.    Genitourinary: Refused by patient Musculoskeletal: Normal range of motion. He exhibits no edema and no tenderness.  Lymphadenopathy:   He has no cervical adenopathy.  Neurological: He is alert and oriented to person, place, and time. He has normal reflexes. No cranial nerve deficit. He exhibits normal muscle tone.  Skin: Skin is warm and dry. No rash noted. No erythema. No pallor.  Psychiatric: He has a normal mood and affect. His behavior is normal. Judgment and thought content normal.   Assessment/Plan:  1. Essential hypertension - Basic metabolic panel - CBC with Differential/Platelet - EKG 12-LeadSinus  Bradycardia  -First degree A-V block , Rate 51 - Hepatic function panel - Lipid panel - POCT urinalysis dipstick - TSH - PSA The natural history of prostate cancer and ongoing controversy regarding screening and potential treatment outcomes of prostate cancer has been discussed with the patient. The meaning of a false positive PSA and a false negative PSA has been discussed. He indicates understanding of the limitations  of this screening test and wishes to proceed with screening PSA testing.   2. Hyperlipidemia  - Basic metabolic panel - CBC with Differential/Platelet - EKG 12-Lead - Hepatic function panel - Lipid panel - POCT urinalysis dipstick - TSH - PSA  3. Routine general medical examination at a health care facility - Continue to exercise - Continue to eat a healthy diet, cut back on the portion size of pastas - Will follow up regarding lab work  - Follow up in one year for CPE - Follow up sooner if needed - Basic metabolic panel - CBC with Differential/Platelet - Hepatic function panel - Lipid panel - POCT urinalysis dipstick - TSH - PSA   During the course of the visit the patient was educated and counseled about appropriate screening and preventive services including:        Fall prevention   Diabetes screening  Nutrition counseling    These are  the goals we discussed: Goals    . Exercise 150 minutes per week (moderate activity)    . Reduce calorie intake to 2000 calories per day    . Reduce portion size       This is a list of the screening recommended for you and due dates:  Health Maintenance  Topic Date Due  . Flu Shot  02/23/2015  . Tetanus Vaccine  10/03/2021  . Colon Cancer Screening  12/22/2022  . Shingles Vaccine  Completed  . Pneumonia vaccines  Completed      Return precautions advised.   Shirline Frees, AGNP

## 2015-02-23 NOTE — Patient Instructions (Addendum)
.    Mr. Bazen , Thank you for taking time to come for your Medicare Wellness Visit. I appreciate your ongoing commitment to your health goals. Please review the following plan we discussed and let me know if I can assist you in the future.   These are the goals we discussed: Goals    . Exercise 150 minutes per week (moderate activity)    . Reduce calorie intake to 2000 calories per day    . Reduce portion size       This is a list of the screening recommended for you and due dates:  Health Maintenance  Topic Date Due  . Flu Shot  02/23/2015  . Tetanus Vaccine  10/03/2021  . Colon Cancer Screening  12/22/2022  . Shingles Vaccine  Completed  . Pneumonia vaccines  Completed

## 2015-02-24 ENCOUNTER — Telehealth: Payer: Self-pay | Admitting: Adult Health

## 2015-02-24 NOTE — Telephone Encounter (Signed)
Spoke to patient and informed him of lab results.

## 2015-03-27 ENCOUNTER — Other Ambulatory Visit: Payer: Self-pay | Admitting: Family Medicine

## 2015-04-19 ENCOUNTER — Other Ambulatory Visit: Payer: Self-pay | Admitting: Family Medicine

## 2015-05-07 ENCOUNTER — Other Ambulatory Visit: Payer: Self-pay | Admitting: Family Medicine

## 2015-05-12 DIAGNOSIS — H401131 Primary open-angle glaucoma, bilateral, mild stage: Secondary | ICD-10-CM | POA: Diagnosis not present

## 2015-05-14 ENCOUNTER — Other Ambulatory Visit: Payer: Self-pay | Admitting: Family Medicine

## 2015-08-01 ENCOUNTER — Other Ambulatory Visit: Payer: Self-pay | Admitting: Family Medicine

## 2015-08-04 DIAGNOSIS — H401131 Primary open-angle glaucoma, bilateral, mild stage: Secondary | ICD-10-CM | POA: Diagnosis not present

## 2015-08-09 ENCOUNTER — Other Ambulatory Visit: Payer: Self-pay | Admitting: Family Medicine

## 2015-09-07 ENCOUNTER — Other Ambulatory Visit: Payer: Self-pay | Admitting: Family Medicine

## 2015-10-05 ENCOUNTER — Telehealth (HOSPITAL_COMMUNITY): Payer: Self-pay | Admitting: Vascular Surgery

## 2015-10-05 NOTE — Telephone Encounter (Signed)
Pt called he would like to see Bensimhon, he saw Bensimhon in 2013 general cardiology [address]. Pt would like to speak to Nurse about his matter

## 2015-10-27 DIAGNOSIS — H401131 Primary open-angle glaucoma, bilateral, mild stage: Secondary | ICD-10-CM | POA: Diagnosis not present

## 2015-11-30 ENCOUNTER — Telehealth: Payer: Self-pay | Admitting: Family Medicine

## 2015-11-30 NOTE — Telephone Encounter (Signed)
Pt states his cardiologist Dr Gala RomneyBensimhon has retired and pt would like dr todd to refer him to another cardiologist.

## 2015-12-01 NOTE — Telephone Encounter (Signed)
Left message on machine per Dr Tawanna Coolerodd the Cardiologist office will help him establish with a new provider.

## 2015-12-31 ENCOUNTER — Encounter: Payer: Self-pay | Admitting: Cardiovascular Disease

## 2015-12-31 ENCOUNTER — Ambulatory Visit (INDEPENDENT_AMBULATORY_CARE_PROVIDER_SITE_OTHER): Payer: Medicare Other | Admitting: Cardiovascular Disease

## 2015-12-31 VITALS — BP 112/66 | HR 56 | Ht 69.0 in | Wt 215.6 lb

## 2015-12-31 DIAGNOSIS — I25119 Atherosclerotic heart disease of native coronary artery with unspecified angina pectoris: Secondary | ICD-10-CM

## 2015-12-31 DIAGNOSIS — E785 Hyperlipidemia, unspecified: Secondary | ICD-10-CM | POA: Diagnosis not present

## 2015-12-31 DIAGNOSIS — I2510 Atherosclerotic heart disease of native coronary artery without angina pectoris: Secondary | ICD-10-CM | POA: Diagnosis not present

## 2015-12-31 DIAGNOSIS — I10 Essential (primary) hypertension: Secondary | ICD-10-CM

## 2015-12-31 NOTE — Patient Instructions (Signed)
Medication Instructions:  Your physician recommends that you continue on your current medications as directed. Please refer to the Current Medication list given to you today.   Labwork: Your physician recommends that you return for lab work TOMORROW (cholesterol, complete metabolic panel) You will need to FAST for this appointment - nothing to eat or drink after midnight the night before except water.   Testing/Procedures: None Ordered   Follow-Up: Your physician wants you to follow-up in: 1 year with Dr. Nahser.  You will receive a reminder letter in the mail two months in advance. If you don't receive a letter, please call our office to schedule the follow-up appointment.   If you need a refill on your cardiac medications before your next appointment, please call your pharmacy.   Thank you for choosing [facility]! Michelle Swinyer, RN [phone]    

## 2015-12-31 NOTE — Progress Notes (Signed)
Cardiology Office Note   Date:  12/31/2015   ID:  Ian Hale, DOB 01/26/1935, MRN [id number]  PCP:  Evette GeorgesDD,JEFFREY ALLEN, MD  Cardiologist:   Kristeen MissPhilip Damonique Brunelle, MD   Chief Complaint  Patient presents with  . Coronary Artery Disease   Problem List   1.  Coronary artery disease- status post coronary artery bypass grafting , 02/17/06   2. Hypertension 3. Hyperlipidemia   History of Present Illness: Ian FlavorsRobert A Hale is a 81 y.o. male who presents for his CAD. Has a remote hx of CAD and CABG .  Previously saw Bensimhon.  No CP or dyspnea.  Is active  Gets some occasional tingling in his fingers .  Past Medical History  Diagnosis Date  . Allergy   . CAD (coronary artery disease)   . Hyperlipidemia   . Hypertension   . BPH (benign prostatic hyperplasia)   . Hearing loss of both ears   . Glaucoma   . Cholesteatoma of left ear   . ED (erectile dysfunction)     Past Surgical History  Procedure Laterality Date  . 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 Coronary artery bypass graft  01/2006     Current Outpatient Prescriptions  Medication Sig Dispense Refill  . aspirin 81 MG tablet Take 81 mg by mouth daily.      Marland Kitchen. atorvastatin (LIPITOR) 80 MG tablet TAKE 1 TABLET BY MOUTH EVERY DAY 100 tablet 3  . bimatoprost (LUMIGAN) 0.01 % SOLN Place 1 drop into both eyes at bedtime.    . furosemide (LASIX) 20 MG tablet TAKE 1 TABLET BY MOUTH EVERY DAY 100 tablet 2  . lisinopril (PRINIVIL,ZESTRIL) 10 MG tablet Take 1 tablet (10 mg total) by mouth daily. 100 tablet 3  . metoprolol tartrate (LOPRESSOR) 25 MG tablet TAKE 1/2 TABLET BY MOUTH TWICE DAILY 90 tablet 3  . nitroGLYCERIN (NITROSTAT) 0.4 MG SL tablet Place 1 tablet (0.4 mg total) under the tongue every 5 (five) minutes as needed. 25 tablet 1  . potassium chloride SA (K-DUR,KLOR-CON) 20 MEQ tablet TAKE 1/2 TABLET BY MOUTH EVERY DAY 90 tablet 0  . saw palmetto 160 MG capsule Take 160 mg by mouth 2 (two) times daily.       No current  facility-administered medications for this visit.    Allergies:   Review of patient's allergies indicates no known allergies.    Social History:  The patient  reports that he has never smoked. He has never used smokeless tobacco. He reports that he does not drink alcohol or use illicit drugs.   Family History:  The patient's family history includes Depression in his father; Heart failure in his father and mother.    ROS:  Please see the history of present illness.    Review of Systems: Constitutional:  denies fever, chills, diaphoresis, appetite change and fatigue.  HEENT: denies photophobia, eye pain, redness, hearing loss, ear pain, congestion, sore throat, rhinorrhea, sneezing, neck pain, neck stiffness and tinnitus.  Respiratory: denies SOB, DOE, cough, chest tightness, and wheezing.  Cardiovascular: denies chest pain, palpitations and leg swelling.  Gastrointestinal: denies nausea, vomiting, abdominal pain, diarrhea, constipation, blood in stool.  Genitourinary: denies dysuria, urgency, frequency, hematuria, flank pain and difficulty urinating.  Musculoskeletal: denies  myalgias, back pain, joint swelling, arthralgias and gait problem.   Skin: denies pallor, rash and wound.  Neurological: denies dizziness, seizures, syncope, weakness, light-headedness, numbness and headaches.   Hematological: denies adenopathy, easy bruising, personal or family bleeding history.  Psychiatric/ Behavioral: denies suicidal ideation, mood changes, confusion, nervousness, sleep disturbance and agitation.       All other systems are reviewed and negative.    PHYSICAL EXAM: VS:  BP 112/66 mmHg  Pulse 56  Ht  (1.753 m)  Wt 215 lb 9.6 oz (97.796 kg)  BMI 31.82 kg/m2 , BMI Body mass index is 31.82 kg/(m^2). GEN: Well nourished, well developed, in no acute distress HEENT: normal Neck: no JVD, carotid bruits, or masses Cardiac: RRR; no murmurs, rubs, or gallops,no edema  Respiratory:  clear  to auscultation bilaterally, normal work of breathing GI: soft, nontender, nondistended, + BS MS: no deformity or atrophy Skin: warm and dry, no rash Neuro:  Strength and sensation are intact Psych: normal   EKG:  EKG is ordered today. The ekg ordered today demonstrates sinus brady at 56 with 1st degree AV block    Recent Labs: 02/23/2015: ALT 20; BUN 25*; Creatinine, Ser 1.59*; Hemoglobin 16.5; Platelets 214.0; Potassium 5.3*; Sodium 145; TSH 1.45    Lipid Panel    Component Value Date/Time   CHOL 127 02/23/2015 0911   TRIG 116.0 02/23/2015 0911   HDL 38.60* 02/23/2015 0911   CHOLHDL 3 02/23/2015 0911   VLDL 23.2 02/23/2015 0911   LDLCALC 65 02/23/2015 0911      Wt Readings from Last 3 Encounters:  12/31/15 215 lb 9.6 oz (97.796 kg)  02/23/15 219 lb 1.6 oz (99.383 kg)  10/29/13 227 lb (102.967 kg)      Other studies Reviewed: Additional studies/ records that were reviewed today include: . Review of the above records demonstrates:    ASSESSMENT AND PLAN:  1.  Coronary artery disease- status post coronary artery bypass grafting , 02/17/06 . Doing well ,  Walks on the treadmill 5 days a week, active around the yard.     2. Hypertension- -=  BP is well controll  3. Hyperlipidemia - will check lipids tomorrow and again in a year    Current medicines are reviewed at length with the patient today.  The patient does not have concerns regarding medicines.  The following changes have been made:  no change  Labs/ tests ordered today include:  No orders of the defined types were placed in this encounter.     Disposition:   FU with  Me in 1 year      Kristeen Miss, MD  12/31/2015 10:07 AM    [facility] [address] [place], [place], [place]  [address] Phone: [phone]; Fax: [phone]   [facility]  [address] [place], [place]  [address] [phone]   Fax [phone]

## 2016-01-01 ENCOUNTER — Other Ambulatory Visit (INDEPENDENT_AMBULATORY_CARE_PROVIDER_SITE_OTHER): Payer: Medicare Other

## 2016-01-01 DIAGNOSIS — E785 Hyperlipidemia, unspecified: Secondary | ICD-10-CM | POA: Diagnosis not present

## 2016-01-01 DIAGNOSIS — I2510 Atherosclerotic heart disease of native coronary artery without angina pectoris: Secondary | ICD-10-CM | POA: Diagnosis not present

## 2016-01-01 LAB — COMPREHENSIVE METABOLIC PANEL
ALBUMIN: 4.1 g/dL (ref 3.6–5.1)
ALT: 15 U/L (ref 9–46)
AST: 14 U/L (ref 10–35)
Alkaline Phosphatase: 61 U/L (ref 40–115)
BUN: 24 mg/dL (ref 7–25)
CALCIUM: 9 mg/dL (ref 8.6–10.3)
CHLORIDE: 104 mmol/L (ref 98–110)
CO2: 26 mmol/L (ref 20–31)
Creat: 1.45 mg/dL — ABNORMAL HIGH (ref 0.70–1.18)
Glucose, Bld: 94 mg/dL (ref 65–99)
Potassium: 5.1 mmol/L (ref 3.5–5.3)
SODIUM: 139 mmol/L (ref 135–146)
Total Bilirubin: 1.2 mg/dL (ref 0.2–1.2)
Total Protein: 6.6 g/dL (ref 6.1–8.1)

## 2016-01-01 LAB — LIPID PANEL
Cholesterol: 120 mg/dL — ABNORMAL LOW (ref 125–200)
HDL: 34 mg/dL — ABNORMAL LOW (ref >=40)
LDL Cholesterol: 64 mg/dL (ref <130)
Total CHOL/HDL Ratio: 3.5 Ratio (ref <=5.0)
Triglycerides: 108 mg/dL (ref <150)
VLDL: 22 mg/dL (ref <30)

## 2016-01-27 DIAGNOSIS — H5213 Myopia, bilateral: Secondary | ICD-10-CM | POA: Diagnosis not present

## 2016-02-29 ENCOUNTER — Other Ambulatory Visit: Payer: Self-pay | Admitting: Family Medicine

## 2016-04-04 ENCOUNTER — Other Ambulatory Visit: Payer: Self-pay | Admitting: Family Medicine

## 2016-04-05 ENCOUNTER — Telehealth: Payer: Self-pay | Admitting: Emergency Medicine

## 2016-04-05 NOTE — Telephone Encounter (Signed)
Called and spoke with pt verifying if he has established care with a new PCP. Pt states " he has not yet established and is looking to establish where his wife is being seen." Pt states he needs his refill honored for Dr. Tawanna Coolerodd is still his provider. I explained I would make Dr. Tawanna Coolerodd aware of the request and refill the medication accordingly. Pt verbalized understanding.

## 2016-04-26 DIAGNOSIS — H401131 Primary open-angle glaucoma, bilateral, mild stage: Secondary | ICD-10-CM | POA: Diagnosis not present

## 2016-05-10 ENCOUNTER — Other Ambulatory Visit: Payer: Self-pay | Admitting: Family Medicine

## 2016-05-13 ENCOUNTER — Telehealth: Payer: Self-pay

## 2016-05-13 NOTE — Telephone Encounter (Signed)
Receive refill request from pharmacy for Lisinopril. Per chart pt has not been seen by Dr Tawanna Coolerodd since 4/15. Pt will need appt with another provider here establish or with another PCP.   LMTCB

## 2016-05-17 ENCOUNTER — Other Ambulatory Visit: Payer: Self-pay | Admitting: Emergency Medicine

## 2016-05-17 DIAGNOSIS — I10 Essential (primary) hypertension: Secondary | ICD-10-CM

## 2016-05-17 MED ORDER — LISINOPRIL 10 MG PO TABS: 10.0000 mg | ORAL_TABLET | Freq: Every day | ORAL | 1 refills | Status: DC

## 2016-05-20 NOTE — Telephone Encounter (Signed)
RX refilled by Dr Tawanna Coolerodd. Nothing further needed.

## 2016-06-29 ENCOUNTER — Other Ambulatory Visit: Payer: Self-pay | Admitting: Cardiovascular Disease

## 2016-06-29 MED ORDER — ATORVASTATIN CALCIUM 80 MG PO TABS: 80.0000 mg | ORAL_TABLET | Freq: Every day | ORAL | 2 refills | Status: DC

## 2016-06-29 MED ORDER — METOPROLOL TARTRATE 25 MG PO TABS: 12.5000 mg | ORAL_TABLET | Freq: Two times a day (BID) | ORAL | 2 refills | Status: DC

## 2016-06-29 MED ORDER — FUROSEMIDE 20 MG PO TABS: 20.0000 mg | ORAL_TABLET | Freq: Every day | ORAL | 2 refills | Status: DC

## 2016-07-27 DIAGNOSIS — H401131 Primary open-angle glaucoma, bilateral, mild stage: Secondary | ICD-10-CM | POA: Diagnosis not present

## 2016-08-01 DIAGNOSIS — I10 Essential (primary) hypertension: Secondary | ICD-10-CM | POA: Diagnosis not present

## 2016-08-01 DIAGNOSIS — G5602 Carpal tunnel syndrome, left upper limb: Secondary | ICD-10-CM | POA: Diagnosis not present

## 2016-08-01 DIAGNOSIS — E784 Other hyperlipidemia: Secondary | ICD-10-CM | POA: Diagnosis not present

## 2016-08-01 DIAGNOSIS — R209 Unspecified disturbances of skin sensation: Secondary | ICD-10-CM | POA: Diagnosis not present

## 2016-08-01 DIAGNOSIS — Z1389 Encounter for screening for other disorder: Secondary | ICD-10-CM | POA: Diagnosis not present

## 2016-08-01 DIAGNOSIS — R8299 Other abnormal findings in urine: Secondary | ICD-10-CM | POA: Diagnosis not present

## 2016-08-01 DIAGNOSIS — I25810 Atherosclerosis of coronary artery bypass graft(s) without angina pectoris: Secondary | ICD-10-CM | POA: Diagnosis not present

## 2016-08-15 ENCOUNTER — Other Ambulatory Visit: Payer: Self-pay | Admitting: *Deleted

## 2016-08-15 DIAGNOSIS — I10 Essential (primary) hypertension: Secondary | ICD-10-CM

## 2016-08-15 MED ORDER — LISINOPRIL 10 MG PO TABS: 10.0000 mg | ORAL_TABLET | Freq: Every day | ORAL | 1 refills | Status: DC

## 2016-08-15 MED ORDER — POTASSIUM CHLORIDE CRYS ER 20 MEQ PO TBCR: 10.0000 meq | EXTENDED_RELEASE_TABLET | Freq: Every day | ORAL | 1 refills | Status: DC

## 2017-02-15 ENCOUNTER — Other Ambulatory Visit: Payer: Self-pay | Admitting: Cardiovascular Disease

## 2017-02-15 DIAGNOSIS — I10 Essential (primary) hypertension: Secondary | ICD-10-CM

## 2017-02-27 ENCOUNTER — Encounter: Payer: Self-pay | Admitting: Cardiovascular Disease

## 2017-03-15 ENCOUNTER — Ambulatory Visit (INDEPENDENT_AMBULATORY_CARE_PROVIDER_SITE_OTHER): Payer: Medicare Other | Admitting: Cardiovascular Disease

## 2017-03-15 ENCOUNTER — Encounter: Payer: Self-pay | Admitting: Cardiovascular Disease

## 2017-03-15 VITALS — BP 120/60 | HR 55 | Ht 70.0 in | Wt 209.1 lb

## 2017-03-15 DIAGNOSIS — I2510 Atherosclerotic heart disease of native coronary artery without angina pectoris: Secondary | ICD-10-CM

## 2017-03-15 DIAGNOSIS — E782 Mixed hyperlipidemia: Secondary | ICD-10-CM

## 2017-03-15 MED ORDER — NITROGLYCERIN 0.4 MG SL SUBL: 0.4000 mg | SUBLINGUAL_TABLET | SUBLINGUAL | 1 refills | Status: AC | PRN

## 2017-03-15 NOTE — Progress Notes (Signed)
Cardiology Office Note   Date:  03/15/2017   ID:  STEVENSON MCKASKLE, DOB [birth year], MRN [id number]  PCP:  Chilton Greathouse, MD  Cardiologist:   Kristeen Miss, MD   Chief Complaint  Patient presents with  . Follow-up    CAD   Problem List   1.  Coronary artery disease- status post coronary artery bypass grafting , 02/17/06   2. Hypertension 3. Hyperlipidemia   History of Present Illness: Ian Hale is a 82 y.o. male who presents for his CAD. Has a remote hx of CAD and CABG .  Previously saw Bensimhon.  No CP or dyspnea.  Is active  Gets some occasional tingling in his fingers .  Aug. 22 , 2018:  Ian Hale is seen today for follow up of his CAD. Not exercising as much as he would like Had Lifeline screening  - has mild bilateral carotid artery disease     Past Medical History:  Diagnosis Date  . Allergy   . BPH (benign prostatic hyperplasia)   . CAD (coronary artery disease)   . Cholesteatoma of left ear   . ED (erectile dysfunction)   . Glaucoma   . Hearing loss of both ears   . Hyperlipidemia   . Hypertension     Past Surgical History:  Procedure Laterality Date  . CLEFT PALATE REPAIR  at birth  . CORONARY ARTERY BYPASS GRAFT  01/2006     Current Outpatient Prescriptions  Medication Sig Dispense Refill  . aspirin 81 MG tablet Take 81 mg by mouth daily.      Marland Kitchen atorvastatin (LIPITOR) 80 MG tablet Take 1 tablet (80 mg total) by mouth daily. 90 tablet 2  . bimatoprost (LUMIGAN) 0.01 % SOLN Place 1 drop into both eyes at bedtime.    . furosemide (LASIX) 20 MG tablet Take 1 tablet (20 mg total) by mouth daily. 90 tablet 2  . lisinopril (PRINIVIL,ZESTRIL) 10 MG tablet TAKE 1 TABLET(10 MG) BY MOUTH DAILY 30 tablet 0  . metoprolol tartrate (LOPRESSOR) 25 MG tablet Take 0.5 tablets (12.5 mg total) by mouth 2 (two) times daily. 90 tablet 2  . nitroGLYCERIN (NITROSTAT) 0.4 MG SL tablet Place 1 tablet (0.4 mg total) under the tongue every 5 (five) minutes as needed.  25 tablet 1  . potassium chloride SA (K-DUR,KLOR-CON) 20 MEQ tablet Take 0.5 tablets (10 mEq total) by mouth daily. 90 tablet 1  . saw palmetto 160 MG capsule Take 160 mg by mouth 2 (two) times daily.       No current facility-administered medications for this visit.     Allergies:   Patient has no known allergies.    Social History:  The patient  reports that he has never smoked. He has never used smokeless tobacco. He reports that he does not drink alcohol or use drugs.   Family History:  The patient's family history includes Depression in his father; Heart failure in his father and mother.    ROS:  Please see the history of present illness.    Review of Systems: Constitutional:  denies fever, chills, diaphoresis, appetite change and fatigue.  HEENT: denies photophobia, eye pain, redness, hearing loss, ear pain, congestion, sore throat, rhinorrhea, sneezing, neck pain, neck stiffness and tinnitus.  Respiratory: denies SOB, DOE, cough, chest tightness, and wheezing.  Cardiovascular: denies chest pain, palpitations and leg swelling.  Gastrointestinal: denies nausea, vomiting, abdominal pain, diarrhea, constipation, blood in stool.  Genitourinary: denies dysuria, urgency, frequency, hematuria, flank pain and  difficulty urinating.  Musculoskeletal: denies  myalgias, back pain, joint swelling, arthralgias and gait problem.   Skin: denies pallor, rash and wound.  Neurological: denies dizziness, seizures, syncope, weakness, light-headedness, numbness and headaches.   Hematological: denies adenopathy, easy bruising, personal or family bleeding history.  Psychiatric/ Behavioral: denies suicidal ideation, mood changes, confusion, nervousness, sleep disturbance and agitation.       All other systems are reviewed and negative.    PHYSICAL EXAM: VS:  BP 120/60   Pulse (!) 55   Ht 5\' 10"  (1.778 m)   Wt 209 lb 1.9 oz (94.9 kg)   BMI 30.01 kg/m  , BMI Body mass index is 30.01  kg/m. GEN: Well nourished, well developed, in no acute distress  HEENT: normal  Neck: no JVD, carotid bruits, or masses Cardiac: RRR; no murmurs, rubs, or gallops,no edema  Respiratory:  clear to auscultation bilaterally, normal work of breathing GI: soft, nontender, nondistended, + BS MS: no deformity or atrophy  Skin: warm and dry, no rash Neuro:  Strength and sensation are intact Psych: normal   EKG:  EKG is ordered today. Aug. 22, 2018:  The ekg ordered today demonstrates sinus brady at 55 with 1st degree AV block    Recent Labs: No results found for requested labs within last 8760 hours.    Lipid Panel    Component Value Date/Time   CHOL 120 (L) 01/01/2016 0943   TRIG 108 01/01/2016 0943   HDL 34 (L) 01/01/2016 0943   CHOLHDL 3.5 01/01/2016 0943   VLDL 22 01/01/2016 0943   LDLCALC 64 01/01/2016 0943      Wt Readings from Last 3 Encounters:  03/15/17 209 lb 1.9 oz (94.9 kg)  12/31/15 215 lb 9.6 oz (97.8 kg)  02/23/15 219 lb 1.6 oz (99.4 kg)      Other studies Reviewed: Additional studies/ records that were reviewed today include: . Review of the above records demonstrates:    ASSESSMENT AND PLAN:  1.  Coronary artery disease- status post coronary artery bypass grafting , 02/17/06 . Doing well ,  Walks on the treadmill 5 days a week, active around the yard.     2. Hypertension- -=  BP is well controll  3. Hyperlipidemia - He has his labs typically checked by Dr. Felipa Eth  at [facility]. We will have Dr. Felipa Eth  Send [place] those  labs.  4. Mild Carotid artery disease.  no bruit on exam. He had a lifeline screening which shows mild bilateral carotid artery disease. We'll consider formal Carotid artery  duplex at his next visit.  Current medicines are reviewed at length with the patient today.  The patient does not have concerns regarding medicines.  The following changes have been made:  no change  Labs/ tests ordered today include:  No orders of  the defined types were placed in this encounter.    Disposition:   FU with  Me in 6 months      Kristeen Miss, MD  03/15/2017 9:27 AM    [facility] [address] [place], [place], [place]  [address] Phone: [phone]; Fax: [phone]

## 2017-03-15 NOTE — Patient Instructions (Signed)

## 2017-03-21 ENCOUNTER — Other Ambulatory Visit: Payer: Self-pay | Admitting: Cardiovascular Disease

## 2017-03-21 DIAGNOSIS — I10 Essential (primary) hypertension: Secondary | ICD-10-CM

## 2017-04-12 ENCOUNTER — Encounter: Payer: Self-pay | Admitting: Internal Medicine

## 2017-04-12 ENCOUNTER — Ambulatory Visit (INDEPENDENT_AMBULATORY_CARE_PROVIDER_SITE_OTHER)
Admission: RE | Admit: 2017-04-12 | Discharge: 2017-04-12 | Disposition: A | Discharge status: Home / Self Care | Payer: Medicare Other | Source: Ambulatory Visit | Attending: Internal Medicine | Admitting: Internal Medicine

## 2017-04-12 ENCOUNTER — Ambulatory Visit (INDEPENDENT_AMBULATORY_CARE_PROVIDER_SITE_OTHER): Payer: Medicare Other | Admitting: Internal Medicine

## 2017-04-12 VITALS — BP 122/64 | HR 60 | Ht 70.0 in | Wt 210.4 lb

## 2017-04-12 DIAGNOSIS — R9389 Abnormal findings on diagnostic imaging of other specified body structures: Secondary | ICD-10-CM | POA: Insufficient documentation

## 2017-04-12 DIAGNOSIS — R0989 Other specified symptoms and signs involving the circulatory and respiratory systems: Secondary | ICD-10-CM | POA: Insufficient documentation

## 2017-04-12 DIAGNOSIS — R0602 Shortness of breath: Secondary | ICD-10-CM

## 2017-04-12 DIAGNOSIS — R918 Other nonspecific abnormal finding of lung field: Secondary | ICD-10-CM | POA: Diagnosis not present

## 2017-04-12 DIAGNOSIS — R938 Abnormal findings on diagnostic imaging of other specified body structures: Secondary | ICD-10-CM

## 2017-04-12 NOTE — Patient Instructions (Addendum)
ICD-10-CM   1. Abnormal CXR R93.8   2. Bibasilar crackles R09.89   3. Pulmonary infiltrates R91.8   4. Shortness of breath R06.02      - I am concerned you might have Interstitial Lung Disease (ILD)  -  There are > 100 varieties of this - To narrow down possibilities and assess severity please do the following tests  - do full PFT breathing test (choose a location depending on schedule and convenience)  - do High Resolution CT chest wo contrast supine and prone (only Dr Fredirick Lathe or Dr Llana Aliment or Dr Ashley Murrain to read) - do this this week  - do autoimmune panel: but we will order this after CT results  Followup  - next few weeks with me but please note we  Will call for blood work after CT results and blood work needs to be completed atleast 5 days before you see me

## 2017-04-12 NOTE — Addendum Note (Signed)
Addended by: Wyvonne Lenz on: 04/12/2017 09:50 AM   Modules accepted: Orders

## 2017-04-12 NOTE — Addendum Note (Signed)
Addended by: Wyvonne Lenz on: 04/12/2017 09:47 AM   Modules accepted: Orders

## 2017-04-12 NOTE — Progress Notes (Addendum)
Subjective:    Patient ID: Ian Hale, male    DOB: 13-Oct-1934, 82 y.o.   MRN: [id number]   PCP Chilton Greathouse, MD  HPI   IOV 04/12/2017  Chief Complaint  Patient presents with  . Advice Only    Referred by Dr. Felipa Eth due to abn. cxr. Denies any cough, CP, or SOB.    Review of primary care physician notes from 02/22/2017 indicates that when he went there for routine annual physical exam the patient is active on a treadmill without any exertional difficulties. He does have coronary artery disease but without symptoms. His main issues with social stressors with his wife being on hospice therapy.. In particular he denied any cough, dyspnea, excessive sputum, hemoptysis, wheezing. This no family history of pulmonary fibrosis on the primary care physician notes. He status post bypass surgery he does have chronic kidney disease stage III and some mild PSA increase. When he saw primary care physician for this visit there was a chest x-ray performed earlier to this visit. This chest x-ray was performed in 02/01/2017 and it was reported as cardiomegaly with diffuse interstitial lung disease. I do not have these films for my review. In our medical record system he did have a chest x-ray in 2007 that was reported as pleural effusions. Again I could not visualize this film  Walking desaturation test 185 feet 3 laps on room at in our office. Resting heart rate was 59/m. Peak heart rate was 71/m. Resting pulse ox was 99%. Final pulse ox was 94%. He did not have significant desaturations but he did drop more than three points. Creatinine on 02/22/2017 was 1.6 mg percent and hemoglobin 16.4 mg percent    In talking to him today confirms the history of very little symptoms. In fact he tells me that he walks 30 minutes a day at least 3 times a week on treadmill at 4 miles an hour. Sometimes he is able to walk one hour and other days he is able to only walk 15 minutes. He just stopped due to fatigue but has  no shortness of breath or cough or wheezing. However, when asked him to do the KBILD - he did express mild dyspnea on exertion releived by rest.  During routine physical exam crackles was discovered and the chest x-ray showed ILD and therefore he has been referred.  He is a retired Airline pilot. His wife is 62 years old and he takes care of her in hospice.   [employer] of chest physicians interstitial lung disease questionnaire - Subjectively although to me directly he denied cough but on the questionnaire he did admit that he has occasional but not bothersome cough. He does not cough at night. And although he denied any dyspnea on the questionnaire he did admit that he gets short of breath when hurrying on level ground or walking up a slight hill. -Past medical history: Positive for acid reflux which is occasional easy bruising history of heart disease -Personally exposure history: Denies any smoking or illicit drug use. Denies any family history of interstitial lung disease - Home exposures history: Denies any humidifier HEART ELBOW BIRDS OR WATER DAMAGE OR MOLD -OCCUPATION: ACCOUNTANT FOR 60 YEARS. - meds: ace inhibitor +, omega 3 +     K-BILD ILD QUESTIONNAIRE, Symptom score over prior 2 weeks  7-none, 6-rarely, 5-occ, 5-some times, 3-sev times, 2-most times, 1-every time 04/12/2017   Dyspnea for stairs, incline or hill 5  Chest Tightness 5  Worry about  seriousness of lung complaint 6  Avoided doing things that make you dyspneic 6  Have you felt loss of control of lung condition (reversed from original) 6  Felt fed up due to lung condition 7  Felt urge to breathe aka air hunger 7  Has lung condition made you feel anxious 6  How often have you experienced wheezing or whistling sound 6  How much of the time have you felt your lung dz is getting worse 6  How much has your lung condition interfered with job or daily task 6  Were you expecting your lung condition to get worse 6  How  much has your lung function limited you carrying things like groceris 7  How much has your lung function made you think of EOL? 6  Total   Are you financially worse off 7  Grand Total       has a past medical history of Allergy; BPH (benign prostatic hyperplasia); CAD (coronary artery disease); Cholesteatoma of left ear; ED (erectile dysfunction); Glaucoma; Glaucoma; Hearing loss of both ears; Hyperlipidemia; and Hypertension.   reports that he has never smoked. He has never used smokeless tobacco.  Past Surgical History:  Procedure Laterality Date  . CLEFT PALATE REPAIR  at birth  . CORONARY ARTERY BYPASS GRAFT  01/2006    No Known Allergies  Immunization History  Administered Date(s) Administered  . Influenza Whole 07/31/2007  . Influenza, High Dose Seasonal PF 09/22/2016  . Influenza-Unspecified 06/24/2013  . Pneumococcal Conjugate-13 10/29/2013  . Pneumococcal Polysaccharide-23 07/25/2001, 07/31/2007  . Td 07/25/2001  . Tdap 10/04/2011  . Zoster 07/31/2008    Family History  Problem Relation Age of Onset  . Heart failure Mother   . Depression Father   . Heart failure Father   . Diabetes Father      Current Outpatient Prescriptions:  .  aspirin 81 MG tablet, Take 81 mg by mouth daily.  , Disp: , Rfl:  .  atorvastatin (LIPITOR) 80 MG tablet, Take 1 tablet (80 mg total) by mouth daily., Disp: 90 tablet, Rfl: 2 .  bimatoprost (LUMIGAN) 0.01 % SOLN, Place 1 drop into both eyes at bedtime., Disp: , Rfl:  .  furosemide (LASIX) 20 MG tablet, Take 1 tablet (20 mg total) by mouth daily., Disp: 90 tablet, Rfl: 2 .  lisinopril (PRINIVIL,ZESTRIL) 10 MG tablet, TAKE 1 TABLET(10 MG) BY MOUTH DAILY, Disp: 30 tablet, Rfl: 11 .  metoprolol tartrate (LOPRESSOR) 25 MG tablet, Take 0.5 tablets (12.5 mg total) by mouth 2 (two) times daily., Disp: 90 tablet, Rfl: 2 .  nitroGLYCERIN (NITROSTAT) 0.4 MG SL tablet, Place 1 tablet (0.4 mg total) under the tongue every 5 (five) minutes as  needed., Disp: 25 tablet, Rfl: 1 .  Omega-3 Fatty Acids (FISH OIL PO), Take 2 capsules by mouth. Pt takes , Disp: , Rfl:  .  potassium chloride SA (K-DUR,KLOR-CON) 20 MEQ tablet, Take 0.5 tablets (10 mEq total) by mouth daily., Disp: 90 tablet, Rfl: 1 .  saw palmetto 160 MG capsule, Take 160 mg by mouth 2 (two) times daily.  , Disp: , Rfl:     Review of Systems  Constitutional: Negative for fever and unexpected weight change.  HENT: Positive for postnasal drip. Negative for congestion, dental problem, ear pain, nosebleeds, rhinorrhea, sinus pressure, sneezing, sore throat and trouble swallowing.   Eyes: Negative for redness and itching.  Respiratory: Negative for cough, chest tightness, shortness of breath and wheezing.   Cardiovascular: Negative for palpitations and  leg swelling.  Gastrointestinal: Negative for nausea and vomiting.  Genitourinary: Negative for dysuria.  Musculoskeletal: Negative for joint swelling.  Skin: Negative for rash.  Allergic/Immunologic: Negative.  Negative for environmental allergies, food allergies and immunocompromised state.  Neurological: Negative for headaches.  Hematological: Does not bruise/bleed easily.  Psychiatric/Behavioral: Negative for dysphoric mood. The patient is not nervous/anxious.        Objective:   Physical Exam  Constitutional: He is oriented to person, place, and time. He appears well-developed and well-nourished. No distress.  HENT:  Head: Normocephalic and atraumatic.  Right Ear: External ear normal.  Left Ear: External ear normal.  Mouth/Throat: Oropharynx is clear and moist. No oropharyngeal exudate.  Eyes: Pupils are equal, round, and reactive to light. Conjunctivae and EOM are normal. Right eye exhibits no discharge. Left eye exhibits no discharge. No scleral icterus.  Neck: Normal range of motion. Neck supple. No JVD present. No tracheal deviation present. No thyromegaly present.  Cardiovascular: Normal rate, regular  rhythm and intact distal pulses.  Exam reveals no gallop and no friction rub.   No murmur heard. Pulmonary/Chest: Effort normal. No respiratory distress. He has no wheezes. He has rales. He exhibits no tenderness.  Abdominal: Soft. Bowel sounds are normal. He exhibits no distension and no mass. There is no tenderness. There is no rebound and no guarding.  Musculoskeletal: Normal range of motion. He exhibits no edema or tenderness.  He does not have any clubbing or got gottron papules  Lymphadenopathy:    He has no cervical adenopathy.  Neurological: He is alert and oriented to person, place, and time. He has normal reflexes. No cranial nerve deficit. Coordination normal.  Skin: Skin is warm and dry. No rash noted. He is not diaphoretic. No erythema. No pallor.  Psychiatric: He has a normal mood and affect. His behavior is normal. Judgment and thought content normal.  Nursing note and vitals reviewed.  Vitals:   04/12/17 0912  BP: 122/64  Pulse: 60  SpO2: 95%  Weight: 210 lb 6.4 oz (95.4 kg)  Height:  (1.778 m)    Estimated body mass index is 30.19 kg/m as calculated from the following:   Height as of this encounter:  (1.778 m).   Weight as of this encounter: 210 lb 6.4 oz (95.4 kg).        Assessment & Plan:     ICD-10-CM   1. Abnormal CXR R93.8   2. Bibasilar crackles R09.89   3. Pulmonary infiltrates R91.8        - I am concerned you might have Interstitial Lung Disease (ILD)  -  There are > 100 varieties of this - To narrow down possibilities and assess severity please do the following tests  - do full PFT breathing test (choose a location depending on schedule and convenience)  - do High Resolution CT chest wo contrast supine and prone (only Dr Fredirick Lathe or Dr Llana Aliment or Dr Ashley Murrain to read) - do this this week  - do autoimmune panel: but we will order this after CT results  Followup  - next few weeks with me but please note we  Will call for blood work  after CT results and blood work needs to be completed atleast 5 days before you see me    Dr. Kalman Shan, M.D., [facility].C.P Pulmonary and Critical Care Medicine Staff Physician, [facility] Center Director - Interstitial Lung Disease  Pulmonary Fibrosis [facility],  [place], [address]  Pager: [phone], If no answer or between  15:00h - 7:00h: call [phone] Telephone: [phone]

## 2017-04-13 ENCOUNTER — Telehealth: Payer: Self-pay | Admitting: Internal Medicine

## 2017-04-13 NOTE — Telephone Encounter (Signed)
[name] know that CT confirms pulmnary fibrosis  Plan - keep 04/28/17 appt with pft and me  - sometime next 3- 5 days come to lab and do Serum: ESR, ACE, ANA, DS-DNA, RF, anti-CCP, ssA, ssB, scl-70, Total CK,  RNP, Aldolase,  Hypersensitivity Pneumonitis Panel   Dr. Brand Males, M.D., [facility].C.P Pulmonary and Critical Care Medicine Staff Physician [facility] Pager: [phone], If no answer or between  15:00h - 7:00h: call [phone]  04/13/2017 10:03 AM

## 2017-04-14 ENCOUNTER — Other Ambulatory Visit (INDEPENDENT_AMBULATORY_CARE_PROVIDER_SITE_OTHER): Payer: Medicare Other

## 2017-04-14 ENCOUNTER — Other Ambulatory Visit: Payer: Self-pay | Admitting: *Deleted

## 2017-04-14 DIAGNOSIS — J84112 Idiopathic pulmonary fibrosis: Secondary | ICD-10-CM | POA: Diagnosis not present

## 2017-04-14 LAB — SEDIMENTATION RATE: SED RATE: 18 mm/h (ref 0–20)

## 2017-04-14 NOTE — Telephone Encounter (Signed)
Called pt letting him know the results of his ct scan and that we needed him to come in for some labwork. Pt expressed understanding. Nothing further needed at this time.

## 2017-04-17 LAB — RNP ANTIBODIES

## 2017-04-18 ENCOUNTER — Other Ambulatory Visit: Payer: Self-pay | Admitting: Cardiovascular Disease

## 2017-04-18 ENCOUNTER — Other Ambulatory Visit (INDEPENDENT_AMBULATORY_CARE_PROVIDER_SITE_OTHER): Payer: Medicare Other

## 2017-04-18 ENCOUNTER — Other Ambulatory Visit: Payer: Medicare Other

## 2017-04-18 DIAGNOSIS — J84112 Idiopathic pulmonary fibrosis: Secondary | ICD-10-CM

## 2017-04-18 LAB — CARDIAC PANEL
CK MB: 4.6 ng/mL — AB (ref 0.3–4.0)
CK TOTAL: 149 U/L (ref 7–232)
Relative Index: 3.1 calc — ABNORMAL HIGH (ref 0.0–2.5)

## 2017-04-19 ENCOUNTER — Telehealth: Payer: Self-pay | Admitting: Internal Medicine

## 2017-04-19 LAB — HYPERSENSITIVITY PNUEMONITIS PROFILE
ASPERGILLUS FUMIGATUS: NEGATIVE
Faenia retivirgula: NEGATIVE
PIGEON SERUM: NEGATIVE
S. VIRIDIS: NEGATIVE
T. CANDIDUS: NEGATIVE
T. VULGARIS: NEGATIVE

## 2017-04-19 LAB — ALDOLASE

## 2017-04-19 LAB — ANGIOTENSIN CONVERTING ENZYME: ANGIOTENSIN-CONVERTING ENZYME: 6 U/L — AB (ref 9–67)

## 2017-04-19 LAB — ANA: ANA: POSITIVE — AB

## 2017-04-19 LAB — SJOGREN'S SYNDROME ANTIBODS(SSA + SSB)
SSA (RO) (ENA) ANTIBODY, IGG: NEGATIVE AI
SSB (La) (ENA) Antibody, IgG: <1 AI

## 2017-04-19 LAB — RHEUMATOID FACTOR: Rhuematoid fact SerPl-aCnc: <14 IU/mL (ref <14)

## 2017-04-19 LAB — ANTI-DNA ANTIBODY, DOUBLE-STRANDED: ds DNA Ab: <1 IU/mL

## 2017-04-19 LAB — CK TOTAL AND CKMB (NOT AT ARMC): Total CK: 120 U/L (ref 44–196)

## 2017-04-19 LAB — CYCLIC CITRUL PEPTIDE ANTIBODY, IGG

## 2017-04-19 LAB — ANTI-NUCLEAR AB-TITER (ANA TITER): ANA Titer 1: 1:160 {titer} — ABNORMAL HIGH

## 2017-04-19 LAB — ANTI-SCLERODERMA ANTIBODY: SCLERODERMA (SCL-70) (ENA) ANTIBODY, IGG: NEGATIVE AI

## 2017-04-19 NOTE — Telephone Encounter (Signed)
lmtcb x2 for pt  Notes recorded by Vallery Sa, CMA on 04/19/2017 at 10:51 AM EDT Called patient, unable to reach left message to give [place] a call back. ------  Notes recorded by Kalman Shan, MD on 04/19/2017 at 7:24 AM EDT Autoimmune mostly negative - ; will discuss 04/28/17 visit ([facility] high - will need to check on statin history).

## 2017-04-20 LAB — ALDOLASE: Aldolase: 4.5 U/L (ref <=8.1)

## 2017-04-20 NOTE — Telephone Encounter (Signed)
Called and spoke with pt and he is aware of lab results per MR.

## 2017-04-26 ENCOUNTER — Other Ambulatory Visit: Payer: Self-pay | Admitting: Cardiovascular Disease

## 2017-04-27 NOTE — Telephone Encounter (Signed)
Rx refill sent to pharmacy. 

## 2017-04-28 ENCOUNTER — Other Ambulatory Visit (INDEPENDENT_AMBULATORY_CARE_PROVIDER_SITE_OTHER): Payer: Medicare Other

## 2017-04-28 ENCOUNTER — Ambulatory Visit (INDEPENDENT_AMBULATORY_CARE_PROVIDER_SITE_OTHER): Payer: Medicare Other | Admitting: Internal Medicine

## 2017-04-28 ENCOUNTER — Encounter: Payer: Self-pay | Admitting: Internal Medicine

## 2017-04-28 ENCOUNTER — Telehealth: Payer: Self-pay | Admitting: Internal Medicine

## 2017-04-28 VITALS — BP 128/64 | HR 66 | Ht 68.0 in | Wt 208.0 lb

## 2017-04-28 DIAGNOSIS — R9389 Abnormal findings on diagnostic imaging of other specified body structures: Secondary | ICD-10-CM

## 2017-04-28 DIAGNOSIS — R0602 Shortness of breath: Secondary | ICD-10-CM

## 2017-04-28 DIAGNOSIS — J84112 Idiopathic pulmonary fibrosis: Secondary | ICD-10-CM | POA: Diagnosis not present

## 2017-04-28 DIAGNOSIS — R0989 Other specified symptoms and signs involving the circulatory and respiratory systems: Secondary | ICD-10-CM

## 2017-04-28 LAB — HEPATIC FUNCTION PANEL
ALT: 14 U/L (ref 0–53)
AST: 13 U/L (ref 0–37)
Albumin: 4.4 g/dL (ref 3.5–5.2)
Alkaline Phosphatase: 63 U/L (ref 39–117)
BILIRUBIN DIRECT: 0.2 mg/dL (ref 0.0–0.3)
BILIRUBIN TOTAL: 0.9 mg/dL (ref 0.2–1.2)
Total Protein: 7.7 g/dL (ref 6.0–8.3)

## 2017-04-28 LAB — PULMONARY FUNCTION TEST
DL/VA % PRED: 78 %
DL/VA: 3.5 ml/min/mmHg/L
DLCO COR % PRED: 49 %
DLCO COR: 14.81 ml/min/mmHg
DLCO unc % pred: 50 %
DLCO unc: 15.1 ml/min/mmHg
FEF 25-75 POST: 1.98 L/s
FEF 25-75 Pre: 2.09 L/sec
FEF2575-%CHANGE-POST: -5 %
FEF2575-%PRED-PRE: 118 %
FEF2575-%Pred-Post: 112 %
FEV1-%CHANGE-POST: -1 %
FEV1-%PRED-POST: 96 %
FEV1-%PRED-PRE: 98 %
FEV1-POST: 2.5 L
FEV1-Pre: 2.54 L
FEV1FVC-%CHANGE-POST: -1 %
FEV1FVC-%PRED-PRE: 109 %
FEV6-%Change-Post: 0 %
FEV6-%PRED-PRE: 94 %
FEV6-%Pred-Post: 95 %
FEV6-Post: 3.24 L
FEV6-Pre: 3.24 L
FEV6FVC-%Change-Post: 0 %
FEV6FVC-%Pred-Post: 107 %
FEV6FVC-%Pred-Pre: 107 %
FVC-%Change-Post: 0 %
FVC-%PRED-POST: 88 %
FVC-%PRED-PRE: 88 %
FVC-POST: 3.25 L
FVC-PRE: 3.25 L
Post FEV1/FVC ratio: 77 %
Post FEV6/FVC ratio: 100 %
Pre FEV1/FVC ratio: 78 %
Pre FEV6/FVC Ratio: 100 %
RV % pred: 119 %
RV: 3.05 L
TLC % pred: 94 %
TLC: 6.31 L

## 2017-04-28 NOTE — Telephone Encounter (Signed)
Ian Hale (cc Robynn Pane)  [name] we gave esbriet samples - did you ensure it was a slow titration as I advised (not written in instructions) because we found the smaples after my instructions were printed. Please clarify  THanks  Dr. Kalman Shan, M.D., [facility].C.P Pulmonary and Critical Care Medicine Staff Physician, [facility] Center Director - Interstitial Lung Disease  Pulmonary Fibrosis [facility] of [facility] at [facility], [place], [address]  Pager: [phone], If no answer or between  15:00h - 7:00h: call [phone] Telephone: [phone]

## 2017-04-28 NOTE — Progress Notes (Signed)
Subjective:     Patient ID: Ian Hale, male   DOB: [birth year], 82 y.o.   MRN: [id number]  HPI   PCP Chilton Greathouse, MD  HPI   IOV 04/12/2017  Chief Complaint  Patient presents with  . Advice Only    Referred by Dr. Felipa Eth due to abn. cxr. Denies any cough, CP, or SOB.    Review of primary care physician notes from 02/22/2017 indicates that when he went there for routine annual physical exam the patient is active on a treadmill without any exertional difficulties. He does have coronary artery disease but without symptoms. His main issues with social stressors with his wife being on hospice therapy.. In particular he denied any cough, dyspnea, excessive sputum, hemoptysis, wheezing. This no family history of pulmonary fibrosis on the primary care physician notes. He status post bypass surgery he does have chronic kidney disease stage III and some mild PSA increase. When he saw primary care physician for this visit there was a chest x-ray performed earlier to this visit. This chest x-ray was performed in 02/01/2017 and it was reported as cardiomegaly with diffuse interstitial lung disease. I do not have these films for my review. In our medical record system he did have a chest x-ray in 2007 that was reported as pleural effusions. Again I could not visualize this film  Walking desaturation test 185 feet 3 laps on room at in our office. Resting heart rate was 59/m. Peak heart rate was 71/m. Resting pulse ox was 99%. Final pulse ox was 94%. He did not have significant desaturations but he did drop more than three points. Creatinine on 02/22/2017 was 1.6 mg percent and hemoglobin 16.4 mg percent    In talking to him today confirms the history of very little symptoms. In fact he tells me that he walks 30 minutes a day at least 3 times a week on treadmill at 4 miles an hour. Sometimes he is able to walk one hour and other days he is able to only walk 15 minutes. He just stopped due to fatigue but  has no shortness of breath or cough or wheezing. However, when asked him to do the KBILD - he did express mild dyspnea on exertion releived by rest.  During routine physical exam crackles was discovered and the chest x-ray showed ILD and therefore he has been referred.  He is a retired Airline pilot. His wife is 64 years old and he takes care of her in hospice.   [employer] of chest physicians interstitial lung disease questionnaire - Subjectively although to me directly he denied cough but on the questionnaire he did admit that he has occasional but not bothersome cough. He does not cough at night. And although he denied any dyspnea on the questionnaire he did admit that he gets short of breath when hurrying on level ground or walking up a slight hill. -Past medical history: Positive for acid reflux which is occasional easy bruising history of heart disease -Personally exposure history: Denies any smoking or illicit drug use. 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 Follow-up    PFT done today. Pt states that he has been doing good. Denies any cough, SOB, or CP. States that he does have postnasal drip.  FU ILD workup to discuss test reslts   His pulmonary function test shows only mild essential lung disease with the isolated reduction in diffusion capacity. His symptoms course are in the mild correlating with that. His high-resolution CT scan of the chest as possible UIP. Other than the fact he does not have classic honeycombing is a classic UIP pattern in that it has bibasal pleural bilateral reticulation with basilar predominance. His autoimmune profile is essentially negative. The slightly high ANA is consistent with age.  K-BILD ILD QUESTIONNAIRE,  Symptom score over prior 2 weeks  7-none, 6-rarely, 5-occ, 5-some times, 3-sev times, 2-most times, 1-every time 04/12/2017   Dyspnea for stairs, incline or hill 5  Chest Tightness 5  Worry about seriousness of lung complaint 6  Avoided doing things that make you dyspneic 6  Have you felt loss of control of lung condition (reversed from original) 6  Felt fed up due to lung condition 7  Felt urge to breathe aka air hunger 7  Has lung condition made you feel anxious 6  How often have you experienced wheezing or whistling sound 6  How much of the time have you felt your lung dz is getting worse 6  How much has your lung condition interfered with job or daily task 6  Were you expecting your lung condition to get worse 6  How much has your lung function limited you carrying things like groceris 7  How much has your lung function made you think of EOL? 6  Total   Are you financially worse off 7  Grand Total     Results for Ian Hale, Ian Hale (MRN [id number]) as of 04/28/2017 12:28  Ref. Range 04/28/2017 10:48  FVC-Pre Latest Units: L 3.25  FVC-%Pred-Pre Latest Units: % 88  Results for Ian Hale, Ian Hale (MRN [id number]) as of 04/28/2017 12:28  Ref. Range 04/28/2017 10:48  TLC Latest Units: L 6.31  TLC % pred Latest Units: % 94  Results for Ian Hale, Ian Hale (MRN [id number]) as of 04/28/2017 12:28  Ref. Range 04/28/2017 10:48  DLCO cor Latest Units: ml/min/mmHg 14.81  DLCO cor % pred Latest Units: % 49   IMPRESSION: Spectrum of findings compatible with basilar predominant fibrotic interstitial lung disease without frank honeycombing. Findings appear worsened or potentially new compared to the most recent comparison chest radiographs of 02/20/2006. Findings are worrisome for usual interstitial pneumonia (UIP), although the differential includes fibrotic nonspecific interstitial pneumonia (NSIP). Follow-up high-resolution chest CT recommended in 6-12 months to assess temporal pattern  stability.  Aortic Atherosclerosis (ICD10-I70.0).   Electronically Signed   By: Delbert Phenix M.D.   On: 04/12/2017 17:13  Results for Ian Hale, Ian Hale (MRN [id number]) as of 04/28/2017 12:28  Ref. Range 04/14/2017 13:31  ASPERGILLUS FUMIGATUS Latest Ref Range: NEGATIVE  NEGATIVE  Pigeon Serum Latest Ref Range: NEGATIVE  NEGATIVE  Anit Nuclear Antibody(ANA) Latest Ref Range: NEGATIVE  POSITIVE (A)  ANA Pattern 1 Unknown HOMOGENEOUS (A)  ANA Titer 1 Latest Units: titer 1:160 (H)  Angiotensin-Converting Enzyme Latest Ref Range: 9 - 67 U/L 6 (L)  Cyclic Citrullin Peptide Ab Latest Units: UNITS <16  ds DNA Ab Latest Units: IU/mL <1  ENA RNP Ab Latest Ref Range: 0.0 - 0.9 AI <0.2  RA Latex Turbid. Latest Ref Range: <14 IU/mL <14  SSA (Ro) (ENA) Antibody, IgG Latest Ref Range: <1.0 NEG AI <1.0 NEG  SSB (La) (ENA) Antibody, IgG Latest Ref Range: <1.0 NEG AI <1.0 NEG  Scleroderma (Scl-70) (ENA) Antibody, IgG  Latest Ref Range: <1.0 NEG AI <1.0 NEG    Review of Systems     Objective:   Physical Exam  Vitals:   04/28/17 1229  BP: 128/64  Pulse: 66  SpO2: 97%    Estimated body mass index is 30.19 kg/m as calculated from the following:   Height as of 04/12/17:  (1.778 m).   Weight as of 04/12/17: 210 lb 6.4 oz (95.4 kg).  discusion only    Assessment:       ICD-10-CM   1. IPF (idiopathic pulmonary fibrosis) (HCC) J84.112    The diagnosis of IPF is based on the fact he is a male age greater than 21 possible UIP on CT chest negative autoimmune profile negative history. Extreme high confidence making this clinical diagnosis. Biopsy not required     Plan:      You have IPF disease.  Diagnosis given 04/28/2017   Re IPF - Course   - progressive disease in almost all patients (> 90%); typically few to several year progression   - super unlukcy 10% - progress to death in a year   - super lucky < 10% - stability > 5 years or even rarely 10 years   -unpredictable in each  individual - Treatment  - anti-fibrotics since 2014 aim to slow disease down disease progression. They do not treat symptoms   - we discussed ofev v esbriet; and we settled on esbriet (further details below)  - Other pillars of management  - will discuss rehab, support group, research, at future visit    - ESBRIET  - 3 pill three times daily, slow titration.  - Need to wean sunscreen  - Some chance of nausea and anorexia with small chance for diarrhea  - no known heart attack risk - no known bleeding risk,   - need monthly blood work for 6 months - monitor liver function - possible mortality benefit in pooled analysis  - larger world wide experience     > 50% of this > 40 min visit spent in face to face counseling or/and coordination of care    Dr. Kalman Shan, M.D., [facility].C.P Pulmonary and Critical Care Medicine Staff Physician, [facility] Center Director - Interstitial Lung Disease  Pulmonary Fibrosis [facility] at [facility], [place], [address]  Pager: [phone], If no answer or between  15:00h - 7:00h: call [phone] Telephone: [phone]

## 2017-04-28 NOTE — Patient Instructions (Addendum)
ICD-10-CM   1. IPF (idiopathic pulmonary fibrosis) (HCC) J84.112    You have IPF disease.  Diagnosis given 04/28/2017   Re IPF - Course   - progressive disease in almost all patients (> 90%); typically few to several year progression   - super unlukcy 10% - progress to death in a year   - super lucky < 10% - stability > 5 years or even rarely 10 years   -unpredictable in each individual - Treatment  - anti-fibrotics since 2014 aim to slow disease down disease progression. They do not treat symptoms   - we discussed ofev v esbriet; and we settled on esbriet (further details below)  - Other pillars of management  - will discuss rehab, support group, research, at future visit  - FOLLOWUP  4- 6 weeks with Dr Marchelle Gearing   - ESBRIET  - 3 pill three times daily, slow titration.  - Need to wean sunscreen  - Some chance of nausea and anorexia with small chance for diarrhea  - no known heart attack risk - no known bleeding risk,   - need monthly blood work for 6 months - monitor liver function - possible mortality benefit in pooled analysis  - larger world wide experience

## 2017-04-28 NOTE — Progress Notes (Signed)
PFT completed today 04/28/17.  

## 2017-04-28 NOTE — Telephone Encounter (Signed)
Called and spoke with pt letting him know that I had forgotten to give him information about the Esbriet that I was needing him to come and pick up. I typed up the dosing instructions for the Esbriet for pt. Pt expressed this understanding and is coming to pick up the instructions from me today. Nothing further needed at this time.

## 2017-05-03 NOTE — Telephone Encounter (Signed)
Ian Hale has spoken with pt regarding this.   Will forward to [facility] to document and forward to MR.

## 2017-05-03 NOTE — Telephone Encounter (Signed)
I typed up the instructions on how he was to take the Esbriet since we had given him the samples. Pt came by and picked up the instructions from me. Nothing further needed.

## 2017-05-05 ENCOUNTER — Telehealth: Payer: Self-pay | Admitting: Internal Medicine

## 2017-05-05 NOTE — Telephone Encounter (Signed)
Received a PA from [facility] for pt's Esbriet. Faxed the form along with clinical information to [facility].  Will keep this message open in my box to follow up on.

## 2017-05-09 NOTE — Telephone Encounter (Signed)
Information was not received. Called and spoke with Ian Hale from [facility] and initiated PA over the phone. Clinical information was given.   Approved until 07/24/2018. I have left message for patient to advise him of this.

## 2017-05-10 ENCOUNTER — Other Ambulatory Visit: Payer: Self-pay | Admitting: Cardiovascular Disease

## 2017-05-19 ENCOUNTER — Telehealth: Payer: Self-pay | Admitting: Internal Medicine

## 2017-05-19 NOTE — Telephone Encounter (Signed)
Spoke with Pattricia BossAnnie at [facility] She states that the pharmacist closed the order for Esbriet b/c they never heard from us regarding PA  I advised that according to our records, on 05/05/17, we called [facility] and med was approved until 07/24/18  She states that she will now inform the specialty pharm so that they can ship med to the pt  Nothing further needed

## 2017-06-09 ENCOUNTER — Encounter: Payer: Self-pay | Admitting: Internal Medicine

## 2017-06-09 ENCOUNTER — Other Ambulatory Visit (INDEPENDENT_AMBULATORY_CARE_PROVIDER_SITE_OTHER): Payer: Medicare Other

## 2017-06-09 ENCOUNTER — Ambulatory Visit: Payer: Medicare Other | Admitting: Internal Medicine

## 2017-06-09 VITALS — BP 122/72 | HR 62 | Ht 68.0 in | Wt 212.4 lb

## 2017-06-09 DIAGNOSIS — J84112 Idiopathic pulmonary fibrosis: Secondary | ICD-10-CM | POA: Diagnosis not present

## 2017-06-09 DIAGNOSIS — Z79899 Other long term (current) drug therapy: Secondary | ICD-10-CM | POA: Diagnosis not present

## 2017-06-09 LAB — HEPATIC FUNCTION PANEL
ALBUMIN: 4.2 g/dL (ref 3.5–5.2)
ALK PHOS: 60 U/L (ref 39–117)
ALT: 18 U/L (ref 0–53)
AST: 16 U/L (ref 0–37)
BILIRUBIN DIRECT: 0.2 mg/dL (ref 0.0–0.3)
TOTAL PROTEIN: 7.4 g/dL (ref 6.0–8.3)
Total Bilirubin: 0.8 mg/dL (ref 0.2–1.2)

## 2017-06-09 NOTE — Progress Notes (Signed)
Subjective:     Patient ID: Ian Hale, male   DOB: [birth year], 82 y.o.   MRN: [id number]  HPI  PCP Chilton Greathouse, MD  HPI   IOV 04/12/2017  Chief Complaint  Patient presents with  . Advice Only    Referred by Dr. Felipa Eth due to abn. cxr. Denies any cough, CP, or SOB.    Review of primary care physician notes from 02/22/2017 indicates that when he went there for routine annual physical exam the patient is active on a treadmill without any exertional difficulties. He does have coronary artery disease but without symptoms. His main issues with social stressors with his wife being on hospice therapy.. In particular he denied any cough, dyspnea, excessive sputum, hemoptysis, wheezing. This no family history of pulmonary fibrosis on the primary care physician notes. He status post bypass surgery he does have chronic kidney disease stage III and some mild PSA increase. When he saw primary care physician for this visit there was a chest x-ray performed earlier to this visit. This chest x-ray was performed in 02/01/2017 and it was reported as cardiomegaly with diffuse interstitial lung disease. I do not have these films for my review. In our medical record system he did have a chest x-ray in 2007 that was reported as pleural effusions. Again I could not visualize this film  Walking desaturation test 185 feet 3 laps on room at in our office. Resting heart rate was 59/m. Peak heart rate was 71/m. Resting pulse ox was 99%. Final pulse ox was 94%. He did not have significant desaturations but he did drop more than three points. Creatinine on 02/22/2017 was 1.6 mg percent and hemoglobin 16.4 mg percent    In talking to him today confirms the history of very little symptoms. In fact he tells me that he walks 30 minutes a day at least 3 times a week on treadmill at 4 miles an hour. Sometimes he is able to walk one hour and other days he is able to only walk 15 minutes. He just stopped due to fatigue but has  no shortness of breath or cough or wheezing. However, when asked him to do the KBILD - he did express mild dyspnea on exertion releived by rest.  During routine physical exam crackles was discovered and the chest x-ray showed ILD and therefore he has been referred.  He is a retired Air[employer] pilot. His wife is 79 years old and he takes care of her in hospice.   [employer] of chest physicians interstitial lung disease questionnaire - Subjectively although to me directly he denied cough but on the questionnaire he did admit that he has occasional but not bothersome cough. He does not cough at night. And although he denied any dyspnea on the questionnaire he did admit that he gets short of breath when hurrying on level ground or walking up a slight hill. -Past medical history: Positive for acid reflux which is occasional easy bruising history of heart disease -Personally exposure history: Denies any smoking or illicit drug use. 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 Follow-up    PFT done today. Pt states that he has been doing good. Denies any cough, SOB, or CP. States that he does have postnasal drip.  FU ILD workup to discuss test reslts   His pulmonary function test shows only mild essential lung disease with the isolated reduction in diffusion capacity. His symptoms course are in the mild correlating with that. His high-resolution CT scan of the chest as possible UIP. Other than the fact he does not have classic honeycombing is a classic UIP pattern in that it has bibasal pleural bilateral reticulation with basilar predominance. His autoimmune profile is essentially negative. The slightly high ANA is consistent with age.   OV 06/09/2017  Chief  Complaint  Patient presents with  . Follow-up    Follow-up after Esbriet start. States that he has been doing good. Denies any cough, SOB, or CP.   Follow-up idiopathic pulmonary fibrosis.  Diagnosis given April 28, 2017  He was given samples of pirfenidone.  He took this.  He is now run out of it for the last 1 week.  He prefers to do insurance paperwork to start pirfenidone in January 2019 on account of his wife's illness.  His wife is age 80.  He tells me that he is tolerating pirfenidone just fine except for a new onset of small rash macular in his central chest area but this is not bothering him.  He is applying sunscreen regularly.  No nausea vomiting or diarrhea constipation.  He has a lot of questions about IPF its causes and natural history.    K-BILD ILD QUESTIONNAIRE, Symptom score over prior 2 weeks  7-none, 6-rarely, 5-occ, 5-some times, 3-sev times, 2-most times, 1-every time 04/12/2017   Dyspnea for stairs, incline or hill 5  Chest Tightness 5  Worry about seriousness of lung complaint 6  Avoided doing things that make you dyspneic 6  Have you felt loss of control of lung condition (reversed from original) 6  Felt fed up due to lung condition 7  Felt urge to breathe aka air hunger 7  Has lung condition made you feel anxious 6  How often have you experienced wheezing or whistling sound 6  How much of the time have you felt your lung dz is getting worse 6  How much has your lung condition interfered with job or daily task 6  Were you expecting your lung condition to get worse 6  How much has your lung function limited you carrying things like groceris 7  How much has your lung function made you think of EOL? 6  Total   Are you financially worse off 7  Grand Total     Results for Ian FlavorsBENS, Anthone A (MRN [id number]) as of 04/28/2017 12:28  Ref. Range 04/28/2017 10:48  FVC-Pre Latest Units: L 3.25  FVC-%Pred-Pre Latest Units: % 88  Results for Ian FlavorsBENS, Zymier A (MRN [id number])  as of 04/28/2017 12:28  Ref. Range 04/28/2017 10:48  TLC Latest Units: L 6.31  TLC % pred Latest Units: % 94  Results for Ian FlavorsBENS, Iosefa A (MRN [id number]) as of 04/28/2017 12:28  Ref. Range 04/28/2017 10:48  DLCO cor Latest Units: ml/min/mmHg 14.81  DLCO cor % pred Latest Units: % 49   IMPRESSION: Spectrum of findings compatible with basilar predominant fibrotic interstitial lung disease without frank honeycombing. Findings appear worsened or potentially new compared to the most recent comparison chest radiographs of 02/20/2006. Findings are worrisome for usual interstitial pneumonia (UIP), although the differential includes fibrotic nonspecific interstitial pneumonia (NSIP). Follow-up high-resolution chest CT recommended in 6-12 months to assess temporal pattern stability.  Aortic Atherosclerosis (ICD10-I70.0).   Electronically Signed   By: Barbara CowerJason  A Poff M.D.   On: 04/12/2017 17:13  Results for Ian FlavorsBENS, Morell A (MRN [id number]) as of 04/28/2017 12:28  Ref. Range 04/14/2017 13:31  ASPERGILLUS FUMIGATUS Latest Ref Range: NEGATIVE  NEGATIVE  Pigeon Serum Latest Ref Range: NEGATIVE  NEGATIVE  Anit Nuclear Antibody(ANA) Latest Ref Range: NEGATIVE  POSITIVE (A)  ANA Pattern 1 Unknown HOMOGENEOUS (A)  ANA Titer 1 Latest Units: titer 1:160 (H)  Angiotensin-Converting Enzyme Latest Ref Range: 9 - 67 U/L 6 (L)  Cyclic Citrullin Peptide Ab Latest Units: UNITS <16  ds DNA Ab Latest Units: IU/mL <1  ENA RNP Ab Latest Ref Range: 0.0 - 0.9 AI <0.2  RA Latex Turbid. Latest Ref Range: <14 IU/mL <14  SSA (Ro) (ENA) Antibody, IgG Latest Ref Range: <1.0 NEG AI <1.0 NEG  SSB (La) (ENA) Antibody, IgG Latest Ref Range: <1.0 NEG AI <1.0 NEG  Scleroderma (Scl-70) (ENA) Antibody, IgG Latest Ref Range: <1.0 NEG AI <1.0 NEG     Review of Systems     Objective:   Physical Exam Vitals:   06/09/17 0926  BP: 122/72  Pulse: 62  SpO2: 96%  Weight: 212 lb 6.4 oz (96.3 kg)  Height: 5\' 8"  (1.727 m)      Estimated body mass index is 32.3 kg/m as calculated from the following:   Height as of this encounter: 5\' 8"  (1.727 m).   Weight as of this encounter: 212 lb 6.4 oz (96.3 kg). Discussion visit - crackles + follow-up idiopathic pulmonary fibrosis      Assessment:       ICD-10-CM   1. IPF (idiopathic pulmonary fibrosis) (HCC) J84.112   2. Encounter for drug therapy Z79.899        Plan:      We discussed causes and natural history again Glad you are tolerating Esbriet Unclear of the rash on the chest is related to his. Do liver function test today Take 2 more months of sample of  Pirfenidone After this in January 2019 you need to get prescription pirfenidone  Followup -Do  liver function test in 1 month and again 2 months -Any worsening in rash please report to us -In January 2019 do spirometry and diffusion capacity -Return to see me third week of January 2019  - first week FEb 2019    > 50% of this > 25 min visit spent in face to face counseling or coordination of care    Dr. Kalman ShanMurali Kahmya Pinkham, M.D., [facility].C.P Pulmonary and Critical Care Medicine Staff Physician [facility] Pager: [phone], If no answer or between  15:00h - 7:00h: call [phone]  06/09/2017 10:10 AM

## 2017-06-09 NOTE — Patient Instructions (Addendum)
       ICD-10-CM   1. IPF (idiopathic pulmonary fibrosis) (HCC) J84.112   2. Encounter for drug therapy Z79.899    We discussed causes and natural history again Glad you are tolerating Esbriet Unclear of the rash on the chest is related to his. Do liver function test today Take 2 more months of sample of  Pirfenidone After this in January 2019 you need to get prescription pirfenidone  Followup -Do  liver function test in 1 month and again 2 months -Any worsening in rash please report to us -In January 2019 do spirometry and diffusion capacity -Return to see me third week of January 2019  - first week FEb 2019

## 2017-06-09 NOTE — Addendum Note (Signed)
Addended by: Christen ButterASKIN, Mallary Kreger M on: 06/09/2017 10:22 AM   Modules accepted: Orders

## 2017-08-08 ENCOUNTER — Other Ambulatory Visit: Payer: Self-pay | Admitting: Internal Medicine

## 2017-08-08 DIAGNOSIS — J84112 Idiopathic pulmonary fibrosis: Secondary | ICD-10-CM

## 2017-08-14 ENCOUNTER — Encounter: Payer: Self-pay | Admitting: Internal Medicine

## 2017-08-14 ENCOUNTER — Ambulatory Visit: Payer: Medicare Other | Admitting: Internal Medicine

## 2017-08-14 ENCOUNTER — Ambulatory Visit (INDEPENDENT_AMBULATORY_CARE_PROVIDER_SITE_OTHER): Payer: Medicare Other | Admitting: Internal Medicine

## 2017-08-14 ENCOUNTER — Other Ambulatory Visit (INDEPENDENT_AMBULATORY_CARE_PROVIDER_SITE_OTHER): Payer: Medicare Other

## 2017-08-14 VITALS — BP 126/64 | HR 58 | Ht 68.0 in | Wt 212.0 lb

## 2017-08-14 DIAGNOSIS — J84112 Idiopathic pulmonary fibrosis: Secondary | ICD-10-CM | POA: Diagnosis not present

## 2017-08-14 DIAGNOSIS — Z79899 Other long term (current) drug therapy: Secondary | ICD-10-CM

## 2017-08-14 LAB — HEPATIC FUNCTION PANEL
ALBUMIN: 4.4 g/dL (ref 3.5–5.2)
ALK PHOS: 64 U/L (ref 39–117)
ALT: 15 U/L (ref 0–53)
AST: 14 U/L (ref 0–37)
Bilirubin, Direct: 0.1 mg/dL (ref 0.0–0.3)
TOTAL PROTEIN: 7.7 g/dL (ref 6.0–8.3)
Total Bilirubin: 0.7 mg/dL (ref 0.2–1.2)

## 2017-08-14 LAB — BASIC METABOLIC PANEL
BUN: 30 mg/dL — ABNORMAL HIGH (ref 6–23)
CHLORIDE: 101 meq/L (ref 96–112)
CO2: 28 mEq/L (ref 19–32)
CREATININE: 1.42 mg/dL (ref 0.40–1.50)
Calcium: 9.9 mg/dL (ref 8.4–10.5)
GFR: 50.86 mL/min — ABNORMAL LOW (ref >60.00)
Glucose, Bld: 96 mg/dL (ref 70–99)
Potassium: 4.5 mEq/L (ref 3.5–5.1)
SODIUM: 139 meq/L (ref 135–145)

## 2017-08-14 LAB — PULMONARY FUNCTION TEST
DL/VA % PRED: 68 %
DL/VA: 3.05 ml/min/mmHg/L
DLCO COR % PRED: 42 %
DLCO COR: 12.7 ml/min/mmHg
DLCO UNC % PRED: 45 %
DLCO unc: 13.45 ml/min/mmHg
FEF 25-75 PRE: 2.71 L/s
FEF2575-%Pred-Pre: 155 %
FEV1-%Pred-Pre: 95 %
FEV1-Pre: 2.48 L
FEV1FVC-%PRED-PRE: 117 %
FEV6-%Pred-Pre: 87 %
FEV6-PRE: 2.96 L
FEV6FVC-%Pred-Pre: 107 %
FVC-%Pred-Pre: 80 %
FVC-Pre: 2.96 L
PRE FEV6/FVC RATIO: 100 %
Pre FEV1/FVC ratio: 84 %

## 2017-08-14 LAB — CBC WITH DIFFERENTIAL/PLATELET
BASOS PCT: 0.7 % (ref 0.0–3.0)
Basophils Absolute: 0.1 10*3/uL (ref 0.0–0.1)
EOS ABS: 0.3 10*3/uL (ref 0.0–0.7)
Eosinophils Relative: 3.2 % (ref 0.0–5.0)
HEMATOCRIT: 49.5 % (ref 39.0–52.0)
HEMOGLOBIN: 16.7 g/dL (ref 13.0–17.0)
LYMPHS PCT: 17.3 % (ref 12.0–46.0)
Lymphs Abs: 1.7 10*3/uL (ref 0.7–4.0)
MCHC: 33.8 g/dL (ref 30.0–36.0)
MCV: 92.2 fl (ref 78.0–100.0)
MONOS PCT: 8.2 % (ref 3.0–12.0)
Monocytes Absolute: 0.8 10*3/uL (ref 0.1–1.0)
Neutro Abs: 6.9 10*3/uL (ref 1.4–7.7)
Neutrophils Relative %: 70.6 % (ref 43.0–77.0)
Platelets: 220 10*3/uL (ref 150.0–400.0)
RBC: 5.37 Mil/uL (ref 4.22–5.81)
RDW: 14.6 % (ref 11.5–15.5)
WBC: 9.7 10*3/uL (ref 4.0–10.5)

## 2017-08-14 NOTE — Progress Notes (Signed)
Subjective:     Patient ID: Ian Hale, male   DOB: 02-20-1935, 83 y.o.   MRN: [id number]  HPI PCP Chilton Greathouse, MD  HPI   IOV 04/12/2017  Chief Complaint  Patient presents with  . Advice Only    Referred by Dr. Felipa Eth due to abn. cxr. Denies any cough, CP, or SOB.    Review of primary care physician notes from 02/22/2017 indicates that when he went there for routine annual physical exam the patient is active on a treadmill without any exertional difficulties. He does have coronary artery disease but without symptoms. His main issues with social stressors with his wife being on hospice therapy.. In particular he denied any cough, dyspnea, excessive sputum, hemoptysis, wheezing. This no family history of pulmonary fibrosis on the primary care physician notes. He status post bypass surgery he does have chronic kidney disease stage III and some mild PSA increase. When he saw primary care physician for this visit there was a chest x-ray performed earlier to this visit. This chest x-ray was performed in 02/01/2017 and it was reported as cardiomegaly with diffuse interstitial lung disease. I do not have these films for my review. In our medical record system he did have a chest x-ray in 2007 that was reported as pleural effusions. Again I could not visualize this film  Walking desaturation test 185 feet 3 laps on room at in our office. Resting heart rate was 59/m. Peak heart rate was 71/m. Resting pulse ox was 99%. Final pulse ox was 94%. He did not have significant desaturations but he did drop more than three points. Creatinine on 02/22/2017 was 1.6 mg percent and hemoglobin 16.4 mg percent    In talking to him today confirms the history of very little symptoms. In fact he tells me that he walks 30 minutes a day at least 3 times a week on treadmill at 4 miles an hour. Sometimes he is able to walk one hour and other days he is able to only walk 15 minutes. He just stopped due to fatigue but has  no shortness of breath or cough or wheezing. However, when asked him to do the KBILD - he did express mild dyspnea on exertion releived by rest.  During routine physical exam crackles was discovered and the chest x-ray showed ILD and therefore he has been referred.  He is a retired Airline pilot. His wife is 46 years old and he takes care of her in hospice.   [employer] of chest physicians interstitial lung disease questionnaire - Subjectively although to me directly he denied cough but on the questionnaire he did admit that he has occasional but not bothersome cough. He does not cough at night. And although he denied any dyspnea on the questionnaire he did admit that he gets short of breath when hurrying on level ground or walking up a slight hill. -Past medical history: Positive for acid reflux which is occasional easy bruising history of heart disease -Personally exposure history: Denies any smoking or illicit drug use. 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 Follow-up    PFT done today. Pt states that he has been doing good. Denies any cough, SOB, or CP. States that he does have postnasal drip.  FU ILD workup to discuss test reslts   His pulmonary function test shows only mild essential lung disease with the isolated reduction in diffusion capacity. His symptoms course are in the mild correlating with that. His high-resolution CT scan of the chest is PROABABLY UIP in my view. Other than the fact he does not have classic honeycombing is a classic UIP pattern in that it has bibasal pleural bilateral reticulation with basilar predominance with traction bronchiectasis. His autoimmune profile is essentially negative. The slightly high ANA is consistent with  age.   OV 06/09/2017  Chief Complaint  Patient presents with  . Follow-up    Follow-up after Esbriet start. States that he has been doing good. Denies any cough, SOB, or CP.   Follow-up idiopathic pulmonary fibrosis.  Diagnosis given April 28, 2017  He was given samples of pirfenidone.  He took this.  He is now run out of it for the last 1 week.  He prefers to do insurance paperwork to start pirfenidone in January 2019 on account of his wife's illness.  His wife is age 48.  He tells me that he is tolerating pirfenidone just fine except for a new onset of small rash macular in his central chest area but this is not bothering him.  He is applying sunscreen regularly.  No nausea vomiting or diarrhea constipation.  He has a lot of questions about IPF its causes and natural history.   OV 08/14/2017  Chief Complaint  Patient presents with  . Follow-up    PFT done today.  Pt states he has been doing okay since his last visit. Pt still on Esbriet and has been doing fine on that.   Follow-up IPF.  He is now been on Esbriet for a few months.  Last liver function test end of 2018 was normal.  He tells me that he does have some nausea with this.  But it is controlled with cheese.  There is some increased fatigue and shortness of breath and recent times.  Otherwise doing okay.  He still continues to care for his 71 year old wife who is at home with hospice care.  He says white vital signs are stable and she is not actively dying.  His goal is for him to stay as healthy as he can to take care of his wife.  His King's interstitial lung disease question is very similar to before but his spirometry shows a mild decline in a matter of few months.   K-BILD ILD QUESTIONNAIRE, Symptom score over prior 2 weeks  7-none, 6-rarely, 5-occ, 5-some times, 3-sev times, 2-most times, 1-every time 04/12/2017  08/14/2017   Dyspnea for stairs, incline or hill 5 6  Chest Tightness 5 5  Worry about seriousness of lung  complaint 6 5  Avoided doing things that make you dyspneic 6 5  Have you felt loss of control of lung condition (reversed from original) 6 6  Felt fed up due to lung condition 7 5  Felt urge to breathe aka air hunger 7 6  Has lung condition made you feel anxious 6 5  How often have you experienced wheezing or whistling sound 6 5  How much of the time have you felt your lung dz is getting worse 6 5  How much has your lung condition interfered with job or daily task 6 5  Were you expecting your lung condition to get worse 6 6  How much has your lung function limited you carrying things like groceris  7 4  How much has your lung function made you think of EOL? 6   Total    Are you financially worse off 7 6  Grand Total      Results for Ian FlavorsBENS, Ian A (MRN [id number]) as of 08/14/2017 12:42  Ref. Range 04/28/2017 10:48 08/14/2017 10:49  FVC-Pre Latest Units: L 3.25 2.96  FVC-%Pred-Pre Latest Units: % 88 80   Results for Ian FlavorsBENS, Ian A (MRN [id number]) as of 08/14/2017 12:42  Ref. Range 04/28/2017 10:48 08/14/2017 10:49  DLCO cor Latest Units: ml/min/mmHg 14.81 12.70  DLCO cor % pred Latest Units: % 49 42    Walking desaturation test on 08/14/2017 185 feet x 3 laps on ROOM AIR:  did NOT desaturate. Rest pulse ox was 100%, final pulse ox was 95%. HR response 59/min at rest to 75/min at peak exertion. Patient Molly MaduroRobert A Fofana  Did not Desaturate < 88% . Ram A Garlow yes did  Desaturated </= 3% points. Kirke A Gin did not get tachyardic    has a past medical history of Allergy, BPH (benign prostatic hyperplasia), CAD (coronary artery disease), Cholesteatoma of left ear, ED (erectile dysfunction), Glaucoma, Glaucoma, Hearing loss of both ears, Hyperlipidemia, and Hypertension.   reports that  has never smoked. he has never used smokeless tobacco.  Past Surgical History:  Procedure Laterality Date  . CLEFT PALATE REPAIR  at birth  . CORONARY ARTERY BYPASS GRAFT  01/2006    No Known  Allergies  Immunization History  Administered Date(s) Administered  . Influenza Whole 07/31/2007  . Influenza, High Dose Seasonal PF 09/22/2016, 05/18/2017  . Influenza-Unspecified 06/24/2013  . Pneumococcal Conjugate-13 10/29/2013  . Pneumococcal Polysaccharide-23 07/25/2001, 07/31/2007  . Td 07/25/2001  . Tdap 10/04/2011  . Zoster 07/31/2008    Family History  Problem Relation Age of Onset  . Heart failure Mother   . Depression Father   . Heart failure Father   . Diabetes Father      Current Outpatient Medications:  .  aspirin 81 MG tablet, Take 81 mg by mouth daily.  , Disp: , Rfl:  .  atorvastatin (LIPITOR) 80 MG tablet, TAKE 1 TABLET(80 MG) BY MOUTH DAILY, Disp: 90 tablet, Rfl: 2 .  bimatoprost (LUMIGAN) 0.01 % SOLN, Place 1 drop into both eyes at bedtime., Disp: , Rfl:  .  cyanocobalamin 2000 MCG tablet, Take 2,000 mcg by mouth 2 (two) times daily., Disp: , Rfl:  .  ESBRIET 801 MG TABS, Take 1 tablet by mouth 3 (three) times daily. , Disp: , Rfl:  .  furosemide (LASIX) 20 MG tablet, TAKE 1 TABLET(20 MG) BY MOUTH DAILY, Disp: 90 tablet, Rfl: 3 .  lisinopril (PRINIVIL,ZESTRIL) 10 MG tablet, TAKE 1 TABLET(10 MG) BY MOUTH DAILY, Disp: 30 tablet, Rfl: 11 .  metoprolol tartrate (LOPRESSOR) 25 MG tablet, TAKE 1/2 TABLET(12.5 MG) BY MOUTH TWICE DAILY, Disp: 90 tablet, Rfl: 1 .  Omega-3 Fatty Acids (FISH OIL PO), Take 2 capsules by mouth. Pt takes 4000mg , Disp: , Rfl:  .  potassium chloride SA (K-DUR,KLOR-CON) 20 MEQ tablet, Take 0.5 tablets (10 mEq total) by mouth daily., Disp: 90 tablet, Rfl: 1 .  saw palmetto 160 MG capsule, Take 160 mg by mouth 2 (two) times daily.  , Disp: , Rfl:  .  nitroGLYCERIN (NITROSTAT) 0.4 MG SL tablet, Place 1 tablet (0.4 mg total) under the tongue every 5 (five) minutes as needed. (Patient not taking: Reported on 08/14/2017), Disp: 25 tablet, Rfl: 1   Review of  Systems     Objective:   Physical Exam  Constitutional: He is oriented to person,  place, and time. He appears well-developed and well-nourished. No distress.  HENT:  Head: Normocephalic and atraumatic.  Right Ear: External ear normal.  Left Ear: External ear normal.  Mouth/Throat: Oropharynx is clear and moist. No oropharyngeal exudate.  Evidence of cleft lip surgery  Eyes: Conjunctivae and EOM are normal. Pupils are equal, round, and reactive to light. Right eye exhibits no discharge. Left eye exhibits no discharge. No scleral icterus.  Neck: Normal range of motion. Neck supple. No JVD present. No tracheal deviation present. No thyromegaly present.  Cardiovascular: Normal rate, regular rhythm and intact distal pulses. Exam reveals no gallop and no friction rub.  No murmur heard. Pulmonary/Chest: Effort normal. No respiratory distress. He has no wheezes. He has rales. He exhibits no tenderness.  Abdominal: Soft. Bowel sounds are normal. He exhibits no distension and no mass. There is no tenderness. There is no rebound and no guarding.  Musculoskeletal: Normal range of motion. He exhibits no edema or tenderness.  Lymphadenopathy:    He has no cervical adenopathy.  Neurological: He is alert and oriented to person, place, and time. He has normal reflexes. No cranial nerve deficit. Coordination normal.  Skin: Skin is warm and dry. No rash noted. He is not diaphoretic. No erythema. No pallor.  Psychiatric: He has a normal mood and affect. His behavior is normal. Judgment and thought content normal.  Nursing note and vitals reviewed.   Vitals:   08/14/17 1221  BP: 126/64  Pulse: (!) 58  SpO2: 98%  Weight: 212 lb (96.2 kg)  Height: 5\' 8"  (1.727 m)        Assessment:       ICD-10-CM   1. IPF (idiopathic pulmonary fibrosis) (HCC) J84.112   2. Encounter for drug therapy Z79.899        Plan:      Disease appears progressive while on esbriet  Plan - check cbc, bmet, lft blood work - test ONO test on room air - continue esbriet as before - will look for  eligibility into research protocol called [employer] - will give copy of consent [he does not want to increase the risk of adverse effects and decline because he has to take care of his wife although he does have  time commitment for research)  Followup  in 1 month do Pre-bd spiro. No lung volume or bd response. No DLCO Simple walk test at followup on RA Return to see Dr Cynda Familia in 1 month; Tuesday afternoon ILD clinic  > 50% of this > 25 min visit spent in face to face counseling or coordination of care    Dr. Kalman Shan, M.D., [facility].C.P Pulmonary and Critical Care Medicine Staff Physician, [facility] Center Director - Interstitial Lung Disease  Program  Pulmonary Fibrosis [facility] [place], [place], [address]  Pager: [phone], If no answer or between  15:00h - 7:00h: call [phone] Telephone: [phone]

## 2017-08-14 NOTE — Patient Instructions (Addendum)
ICD-10-CM   1. IPF (idiopathic pulmonary fibrosis) (HCC) J84.112   2. Encounter for drug therapy Z79.899     Disease appears progressive while on esbriet  Plan - check cbc, bmet, lft blood work - test ONO test on room air - continue esbriet as before - will look for eligibility into research protocol called [employer] - given copy of consent  Followup  in 1 month do Pre-bd spiro. No lung volume or bd response. No DLCO Simple walk test at followup on RA Return to see Dr Cynda Familiaamswam in 1 month; Tuesday afternoon ILD clinic

## 2017-08-14 NOTE — Progress Notes (Signed)
PFT completed today.  

## 2017-08-21 ENCOUNTER — Other Ambulatory Visit: Payer: Self-pay | Admitting: Internal Medicine

## 2017-09-04 ENCOUNTER — Other Ambulatory Visit: Payer: Self-pay | Admitting: Cardiovascular Disease

## 2017-09-06 NOTE — Telephone Encounter (Signed)
Dr Kelle DartingJeffrey Hale has always refilled this for the patient except for the most recent refill. Okay to continue to refill? Please advise. Thanks, MI

## 2017-09-12 ENCOUNTER — Other Ambulatory Visit (INDEPENDENT_AMBULATORY_CARE_PROVIDER_SITE_OTHER): Payer: Medicare Other

## 2017-09-12 ENCOUNTER — Ambulatory Visit (INDEPENDENT_AMBULATORY_CARE_PROVIDER_SITE_OTHER): Payer: Medicare Other | Admitting: Internal Medicine

## 2017-09-12 ENCOUNTER — Ambulatory Visit: Payer: Medicare Other | Admitting: Internal Medicine

## 2017-09-12 ENCOUNTER — Encounter: Payer: Self-pay | Admitting: Internal Medicine

## 2017-09-12 VITALS — BP 122/70 | HR 65 | Ht 68.0 in | Wt 210.0 lb

## 2017-09-12 DIAGNOSIS — J84112 Idiopathic pulmonary fibrosis: Secondary | ICD-10-CM

## 2017-09-12 DIAGNOSIS — Z79899 Other long term (current) drug therapy: Secondary | ICD-10-CM

## 2017-09-12 LAB — PULMONARY FUNCTION TEST
DL/VA % PRED: 70 %
DL/VA: 3.13 ml/min/mmHg/L
DLCO COR % PRED: 42 %
DLCO COR: 12.71 ml/min/mmHg
DLCO UNC % PRED: 45 %
DLCO unc: 13.4 ml/min/mmHg
FEF 25-75 PRE: 1.91 L/s
FEF2575-%Pred-Pre: 109 %
FEV1-%Pred-Pre: 88 %
FEV1-Pre: 2.28 L
FEV1FVC-%PRED-PRE: 110 %
FEV6-%Pred-Pre: 85 %
FEV6-PRE: 2.89 L
FEV6FVC-%Pred-Pre: 106 %
FVC-%Pred-Pre: 79 %
FVC-Pre: 2.91 L
PRE FEV6/FVC RATIO: 99 %
Pre FEV1/FVC ratio: 78 %

## 2017-09-12 LAB — HEPATIC FUNCTION PANEL
ALBUMIN: 4 g/dL (ref 3.5–5.2)
ALK PHOS: 59 U/L (ref 39–117)
ALT: 12 U/L (ref 0–53)
AST: 11 U/L (ref 0–37)
Bilirubin, Direct: 0.1 mg/dL (ref 0.0–0.3)
TOTAL PROTEIN: 7.1 g/dL (ref 6.0–8.3)
Total Bilirubin: 0.7 mg/dL (ref 0.2–1.2)

## 2017-09-12 NOTE — Patient Instructions (Addendum)
ICD-10-CM   1. IPF (idiopathic pulmonary fibrosis) (HCC) J84.112   2. Encounter for drug therapy Z79.899    Vitals:   09/12/17 1342  BP: 122/70  Pulse: 65  SpO2: 95%  Weight: 210 lb (95.3 kg)  Height: 5\' 8"  (1.727 m)     Lung function seems stable last 1 month Some nausea with esbriet  Plan Take esbriet with food; try ginger capsules Check LFT 09/12/2017 - after this every 3 months In 3 months do Pre-bd spiro and dlco only. No lung volume or bd response. No post-bd spiro Refer PFF support group Will discuss [employer] research at next visit TEst for ONO on room air  Followu 3 months with spirometry - dR Jaelan Rasheed in ILD clinic   - KBILD and simple walk at followup

## 2017-09-12 NOTE — Progress Notes (Signed)
Spirometry and Dlco done today. 

## 2017-09-12 NOTE — Addendum Note (Signed)
Addended by: Wyvonne LenzPINION, Makaylin Carlo P on: 09/12/2017 02:37 PM   Modules accepted: Orders

## 2017-09-12 NOTE — Progress Notes (Signed)
Subjective:     Patient ID: Ian Hale, male   DOB: January 30, 1935, 83 y.o.   MRN: [id number]  HPI  PCP Chilton Greathouse, MD  HPI   IOV 04/12/2017  Chief Complaint  Patient presents with  . Advice Only    Referred by Dr. Felipa Eth due to abn. cxr. Denies any cough, CP, or SOB.    Review of primary care physician notes from 02/22/2017 indicates that when he went there for routine annual physical exam the patient is active on a treadmill without any exertional difficulties. He does have coronary artery disease but without symptoms. His main issues with social stressors with his wife being on hospice therapy.. In particular he denied any cough, dyspnea, excessive sputum, hemoptysis, wheezing. This no family history of pulmonary fibrosis on the primary care physician notes. He status post bypass surgery he does have chronic kidney disease stage III and some mild PSA increase. When he saw primary care physician for this visit there was a chest x-ray performed earlier to this visit. This chest x-ray was performed in 02/01/2017 and it was reported as cardiomegaly with diffuse interstitial lung disease. I do not have these films for my review. In our medical record system he did have a chest x-ray in 2007 that was reported as pleural effusions. Again I could not visualize this film  Walking desaturation test 185 feet 3 laps on room at in our office. Resting heart rate was 59/m. Peak heart rate was 71/m. Resting pulse ox was 99%. Final pulse ox was 94%. He did not have significant desaturations but he did drop more than three points. Creatinine on 02/22/2017 was 1.6 mg percent and hemoglobin 16.4 mg percent    In talking to him today confirms the history of very little symptoms. In fact he tells me that he walks 30 minutes a day at least 3 times a week on treadmill at 4 miles an hour. Sometimes he is able to walk one hour and other days he is able to only walk 15 minutes. He just stopped due to fatigue but has  no shortness of breath or cough or wheezing. However, when asked him to do the KBILD - he did express mild dyspnea on exertion releived by rest.  During routine physical exam crackles was discovered and the chest x-ray showed ILD and therefore he has been referred.  He is a retired Airline pilot. His wife is 49 years old and he takes care of her in hospice.   [employer] of chest physicians interstitial lung disease questionnaire - Subjectively although to me directly he denied cough but on the questionnaire he did admit that he has occasional but not bothersome cough. He does not cough at night. And although he denied any dyspnea on the questionnaire he did admit that he gets short of breath when hurrying on level ground or walking up a slight hill. -Past medical history: Positive for acid reflux which is occasional easy bruising history of heart disease -Personally exposure history: Denies any smoking or illicit drug use. 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 Follow-up    PFT done today. Pt states that he has been doing good. Denies any cough, SOB, or CP. States that he does have postnasal drip.  FU ILD workup to discuss test reslts   His pulmonary function test shows only mild essential lung disease with the isolated reduction in diffusion capacity. His symptoms course are in the mild correlating with that. His high-resolution CT scan of the chest is PROABABLY UIP in my view. Other than the fact he does not have classic honeycombing is a classic UIP pattern in that it has bibasal pleural bilateral reticulation with basilar predominance with traction bronchiectasis. His autoimmune profile is essentially negative. The slightly high ANA is consistent with  age.   OV 06/09/2017  Chief Complaint  Patient presents with  . Follow-up    Follow-up after Esbriet start. States that he has been doing good. Denies any cough, SOB, or CP.   Follow-up idiopathic pulmonary fibrosis.  Esbriet prescribed given April 28, 2017  He was given samples of pirfenidone.  He took this.  He is now run out of it for the last 1 week.  He prefers to do insurance paperwork to start pirfenidone in January 2019 on account of his wife's illness.  His wife is age 81.  He tells me that he is tolerating pirfenidone just fine except for a new onset of small rash macular in his central chest area but this is not bothering him.  He is applying sunscreen regularly.  No nausea vomiting or diarrhea constipation.  He has a lot of questions about IPF its causes and natural history.   OV 08/14/2017  Chief Complaint  Patient presents with  . Follow-up    PFT done today.  Pt states he has been doing okay since his last visit. Pt still on Esbriet and has been doing fine on that.   Follow-up IPF.  He is now been on Esbriet for a few months.  Last liver function test end of 2018 was normal.  He tells me that he does have some nausea with this.  But it is controlled with cheese.  There is some increased fatigue and shortness of breath and recent times.  Otherwise doing okay.  He still continues to care for his 81 year old wife who is at home with hospice care.  He says wife vital signs are stable and she is not actively dying.  His goal is for him to stay as healthy as he can to take care of his wife.  His King's interstitial lung disease question is very similar to before but his spirometry shows a mild decline in a matter of few months.   Walking desaturation test on 08/14/2017 185 feet x 3 laps on ROOM AIR:  did NOT desaturate. Rest pulse ox was 100%, final pulse ox was 95%. HR response 59/min at rest to 75/min at peak exertion. Patient Ian MaduroRobert A Hale  Did not Desaturate < 88% . Ian Hale  yes did  Desaturated </= 3% points. Ian Hale did not get tachyardic      OV 09/12/2017  Chief Complaint  Patient presents with  . Follow-up    PFT done today.  Pt states he believes his breathing is at a stable point.     Follow-up idiopathic pulmonary fibrosis.  IPF dx Sept/oct 2019.  Esbreit prescribed given April 28, 2017   Here with daughter; meeting first time. Tolerating esbriet well but for mild nausea for 90 min post intake. Annoyng but tolerable. Dyspnea stable. Not an issue . KBILD shows improvement. Main reason for 09/12/17 visit to see if PFT decline was a downtrend or not  K-BILD ILD QUESTIONNAIRE,  Symptom score over prior 2 weeks  7-none, 6-rarely, 5-occ, 5-some times, 3-sev times, 2-most times, 1-every time 04/12/2017  08/14/2017  09/12/2017   Dyspnea for stairs, incline or hill 5 6 6   Chest Tightness 5 5 6   Worry about seriousness of lung complaint 6 5 5   Avoided doing things that make you dyspneic 6 5 5   Have you felt loss of control of lung condition (reversed from original) 6 6 6   Felt fed up due to lung condition 7 5 6   Felt urge to breathe aka air hunger 7 6 6   Has lung condition made you feel anxious 6 5 6   How often have you experienced wheezing or whistling sound 6 5 6   How much of the time have you felt your lung dz is getting worse 6 5 5   How much has your lung condition interfered with job or daily task 6 5 5   Were you expecting your lung condition to get worse 6 6 5   How much has your lung function limited you carrying things like groceris 7 4 6   How much has your lung function made you think of EOL? 6  4  Total     Are you financially worse off 7 6 6   Grand Total       Results for Ian Hale, Ian Hale (MRN [id number]) as of 08/14/2017 12:42  Ref. Range 04/28/2017 10:48 08/14/2017 10:49 09/12/2017   FVC-Pre Latest Units: L 3.25 2.96 2.91  FVC-%Pred-Pre Latest Units: % 88 80 79%   Results for Ian Hale, Ian Hale (MRN [id number]) as of 08/14/2017 12:42   Ref. Range 04/28/2017 10:48 08/14/2017 10:49 09/12/2017   DLCO cor Latest Units: ml/min/mmHg 14.81 12.70 13.4  DLCO cor % pred Latest Units: % 49 42 45    has a past medical history of Allergy, BPH (benign prostatic hyperplasia), CAD (coronary artery disease), Cholesteatoma of left ear, ED (erectile dysfunction), Glaucoma, Glaucoma, Hearing loss of both ears, Hyperlipidemia, and Hypertension.   reports that  has never smoked. he has never used smokeless tobacco.  Past Surgical History:  Procedure Laterality Date  . CLEFT PALATE REPAIR  at birth  . CORONARY ARTERY BYPASS GRAFT  01/2006    No Known Allergies  Immunization History  Administered Date(s) Administered  . Influenza Whole 07/31/2007  . Influenza, High Dose Seasonal PF 09/22/2016, 05/18/2017  . Influenza-Unspecified 06/24/2013  . Pneumococcal Conjugate-13 10/29/2013  . Pneumococcal Polysaccharide-23 07/25/2001, 07/31/2007  . Td 07/25/2001  . Tdap 10/04/2011  . Zoster 07/31/2008    Family History  Problem Relation Age of Onset  . Heart failure Mother   . Depression Father   . Heart failure Father   . Diabetes Father      Current Outpatient Medications:  .  aspirin 81 MG tablet, Take 81 mg by mouth daily.  , Disp: , Rfl:  .  atorvastatin (LIPITOR) 80 MG tablet, TAKE 1 TABLET(80 MG) BY MOUTH DAILY, Disp: 90 tablet, Rfl: 2 .  bimatoprost (LUMIGAN) 0.01 % SOLN, Place 1 drop into both eyes at bedtime., Disp: , Rfl:  .  cyanocobalamin 2000 MCG tablet, Take 2,000 mcg by mouth 2 (two) times daily., Disp: , Rfl:  .  ESBRIET 801 MG TABS, TAKE 801MG  BY MOUTH 3 TIMES A DAY WITH FOOD, Disp: 90 tablet, Rfl: 0 .  furosemide (LASIX) 20 MG tablet, TAKE 1 TABLET(20 MG) BY MOUTH DAILY, Disp: 90 tablet, Rfl: 3 .  lisinopril (PRINIVIL,ZESTRIL) 10 MG tablet, TAKE 1 TABLET(10 MG) BY MOUTH DAILY,  Disp: 30 tablet, Rfl: 11 .  metoprolol tartrate (LOPRESSOR) 25 MG tablet, TAKE 1/2 TABLET(12.5 MG) BY MOUTH TWICE DAILY, Disp: 90 tablet, Rfl:  1 .  Omega-3 Fatty Acids (FISH OIL PO), Take 2 capsules by mouth. Pt takes 4000mg , Disp: , Rfl:  .  potassium chloride SA (K-DUR,KLOR-CON) 20 MEQ tablet, TAKE 1/2 TABLET BY MOUTH DAILY, Disp: 90 tablet, Rfl: 0 .  saw palmetto 160 MG capsule, Take 160 mg by mouth 2 (two) times daily.  , Disp: , Rfl:  .  nitroGLYCERIN (NITROSTAT) 0.4 MG SL tablet, Place 1 tablet (0.4 mg total) under the tongue every 5 (five) minutes as needed. (Patient not taking: Reported on 08/14/2017), Disp: 25 tablet, Rfl: 1   Review of Systems     Objective:   Physical Exam  Constitutional: He is oriented to person, place, and time. He appears well-developed and well-nourished. No distress.  HENT:  Head: Normocephalic and atraumatic.  Right Ear: External ear normal.  Left Ear: External ear normal.  Mouth/Throat: Oropharynx is clear and moist. No oropharyngeal exudate.  Eyes: Conjunctivae and EOM are normal. Pupils are equal, round, and reactive to light. Right eye exhibits no discharge. Left eye exhibits no discharge. No scleral icterus.  Neck: Normal range of motion. Neck supple. No JVD present. No tracheal deviation present. No thyromegaly present.  Cardiovascular: Normal rate, regular rhythm and intact distal pulses. Exam reveals no gallop and no friction rub.  No murmur heard. Pulmonary/Chest: Effort normal. No respiratory distress. He has no wheezes. He has rales. He exhibits no tenderness.  Abdominal: Soft. Bowel sounds are normal. He exhibits no distension and no mass. There is no tenderness. There is no rebound and no guarding.  Musculoskeletal: Normal range of motion. He exhibits no edema or tenderness.  Lymphadenopathy:    He has no cervical adenopathy.  Neurological: He is alert and oriented to person, place, and time. He has normal reflexes. No cranial nerve deficit. Coordination normal.  Skin: Skin is warm and dry. No rash noted. He is not diaphoretic. No erythema. No pallor.  Psychiatric: He has a normal  mood and affect. His behavior is normal. Judgment and thought content normal.  Nursing note and vitals reviewed.  Vitals:   09/12/17 1342  BP: 122/70  Pulse: 65  SpO2: 95%  Weight: 210 lb (95.3 kg)  Height: 5\' 8"  (1.727 m)       Assessment:     .   ICD-10-CM   1. IPF (idiopathic pulmonary fibrosis) (HCC) J84.112   2. Encounter for drug therapy Z79.899        Plan:       Lung function seems stable last 1 month Some nausea with esbriet  Plan Take esbriet with food; try ginger capsules Check LFT 09/12/2017 - after this every 3 months In 3 months do Pre-bd spiro and dlco only. No lung volume or bd response. No post-bd spiro Refer PFF support group Will discuss [place] research at next visit TEst for ONO on room air  Followu 3 months with spirometry - dR Nathanyal Ashmead in ILD clinic   - KBILD and simple walk at followup    > 50% of this > 25 min visit spent in face to face counseling or coordination of care    Dr. Kalman Shan, M.D., [facility].C.P Pulmonary and Critical Care Medicine Staff Physician, [facility] Director - Interstitial Lung Disease  Program  Pulmonary Fibrosis [facility] at [facility] [place], [place], [address]  Pager: [phone], If no answer or between  15:00h - 7:00h: call [phone] Telephone: [phone]

## 2017-09-13 ENCOUNTER — Telehealth: Payer: Self-pay | Admitting: Internal Medicine

## 2017-09-13 NOTE — Telephone Encounter (Signed)
Notes recorded by Kalman Shanamaswamy, Murali, MD on 09/12/2017 at 6:05 PM EST Normal lft ------------- lmtcb for pt to make aware of normal lft results.

## 2017-09-14 NOTE — Telephone Encounter (Signed)
Spoke with pt, aware of results/recs.  Nothing further needed.  

## 2017-09-14 NOTE — Telephone Encounter (Signed)
Pt is calling back [phone]

## 2017-09-14 NOTE — Telephone Encounter (Signed)
lmtcb X2 for pt.  

## 2017-09-20 ENCOUNTER — Other Ambulatory Visit: Payer: Self-pay | Admitting: Internal Medicine

## 2017-10-22 ENCOUNTER — Other Ambulatory Visit: Payer: Self-pay | Admitting: Internal Medicine

## 2017-10-27 ENCOUNTER — Ambulatory Visit: Payer: Medicare Other | Admitting: Cardiovascular Disease

## 2017-10-27 ENCOUNTER — Encounter: Payer: Self-pay | Admitting: Cardiovascular Disease

## 2017-10-27 VITALS — BP 100/54 | HR 69 | Ht 70.0 in | Wt 209.8 lb

## 2017-10-27 DIAGNOSIS — I2510 Atherosclerotic heart disease of native coronary artery without angina pectoris: Secondary | ICD-10-CM

## 2017-10-27 DIAGNOSIS — I10 Essential (primary) hypertension: Secondary | ICD-10-CM

## 2017-10-27 NOTE — Progress Notes (Signed)
Cardiology Office Note   Date:  10/27/2017   ID:  Ian Hale, DOB August 31, 1934, MRN [id number]  PCP:  Chilton Greathouse, MD  Cardiologist:   Kristeen Miss, MD   Chief Complaint  Patient presents with  . Coronary Artery Disease  . Hyperlipidemia   Problem List   1.  Coronary artery disease- status post coronary artery bypass grafting , 02/17/06   2. Hypertension 3. Hyperlipidemia   History of Present Illness: Ian Hale is a 83 y.o. male who presents for his CAD. Has a remote hx of CAD and CABG .  Previously saw Bensimhon.  No CP or dyspnea.  Is active  Gets some occasional tingling in his fingers .  Aug. 22 , 2018:  Ian Hale is seen today for follow up of his CAD. Not exercising as much as Ian Hale would like Had Lifeline screening  - has mild bilateral carotid artery disease   October 27, 2017:   Ian Hale is seen back today for follow-up of his coronary artery disease, hypertension and hyperlipidemia.  Ian Hale had coronary artery bypass grafting in July, 2007. BP is a bit low today . Has been diagnosed with idiopathic pulmonary fibrosis Has not had a good appetite  And has lost some weight     Past Medical History:  Diagnosis Date  . Allergy   . BPH (benign prostatic hyperplasia)   . CAD (coronary artery disease)   . Cholesteatoma of left ear   . ED (erectile dysfunction)   . Glaucoma   . Glaucoma   . Hearing loss of both ears   . Hyperlipidemia   . Hypertension     Past Surgical History:  Procedure Laterality Date  . CLEFT PALATE REPAIR  at birth  . CORONARY ARTERY BYPASS GRAFT  01/2006     Current Outpatient Medications  Medication Sig Dispense Refill  . aspirin 81 MG tablet Take 81 mg by mouth daily.      Marland Kitchen atorvastatin (LIPITOR) 80 MG tablet TAKE 1 TABLET(80 MG) BY MOUTH DAILY 90 tablet 2  . bimatoprost (LUMIGAN) 0.01 % SOLN Place 1 drop into both eyes at bedtime.    . cyanocobalamin 2000 MCG tablet Take 2,000 mcg by mouth 2 (two) times daily.    . ESBRIET  801 MG TABS TAKE 1 TABLET (801MG ) BY MOUTH THREE TIMES A DAY WITH FOOD 90 tablet 12  . furosemide (LASIX) 20 MG tablet TAKE 1 TABLET(20 MG) BY MOUTH DAILY 90 tablet 3  . lisinopril (PRINIVIL,ZESTRIL) 10 MG tablet TAKE 1 TABLET(10 MG) BY MOUTH DAILY 30 tablet 11  . metoprolol tartrate (LOPRESSOR) 25 MG tablet TAKE 1/2 TABLET(12.5 MG) BY MOUTH TWICE DAILY 90 tablet 1  . nitroGLYCERIN (NITROSTAT) 0.4 MG SL tablet Place 1 tablet (0.4 mg total) under the tongue every 5 (five) minutes as needed. 25 tablet 1  . Omega-3 Fatty Acids (FISH OIL PO) Take 2 capsules by mouth. Pt takes 4000mg     . potassium chloride SA (K-DUR,KLOR-CON) 20 MEQ tablet TAKE 1/2 TABLET BY MOUTH DAILY 90 tablet 0  . saw palmetto 160 MG capsule Take 160 mg by mouth 2 (two) times daily.       No current facility-administered medications for this visit.     Allergies:   Patient has no known allergies.    Social History:  The patient  reports that Ian Hale has never smoked. Ian Hale has never used smokeless tobacco. Ian Hale reports that Ian Hale does not drink alcohol or use drugs.   Family History:  The patient's family history includes Depression in his father; Diabetes in his father; Heart failure in his father and mother.    ROS:       Physical Exam: Blood pressure (!) 100/54, pulse 69, height 5\' 10"  (1.778 m), weight 209 lb 12.8 oz (95.2 kg), SpO2 92 %.  GEN:  Well nourished, well developed in no acute distress HEENT: Normal NECK: No JVD; No carotid bruits LYMPHATICS: No lymphadenopathy CARDIAC: RRR  no murmurs, rubs, gallops RESPIRATORY:   Fine rales in bases bilaterally  ABDOMEN: Soft, non-tender, non-distended MUSCULOSKELETAL:  Trace edema  SKIN: Warm and dry NEUROLOGIC:  Alert and oriented x 3   ECG :   Recent Labs: 08/14/2017: BUN 30; Creatinine, Ser 1.42; Hemoglobin 16.7; Platelets 220.0; Potassium 4.5; Sodium 139 09/12/2017: ALT 12    Lipid Panel    Component Value Date/Time   CHOL 120 (L) 01/01/2016 0943   TRIG 108  01/01/2016 0943   HDL 34 (L) 01/01/2016 0943   CHOLHDL 3.5 01/01/2016 0943   VLDL 22 01/01/2016 0943   LDLCALC 64 01/01/2016 0943      Wt Readings from Last 3 Encounters:  10/27/17 209 lb 12.8 oz (95.2 kg)  09/12/17 210 lb (95.3 kg)  08/14/17 212 lb (96.2 kg)      Other studies Reviewed: Additional studies/ records that were reviewed today include: . Review of the above records demonstrates:    ASSESSMENT AND PLAN:  1.  Coronary artery disease- status post coronary artery bypass grafting , 02/17/06 . Doing well ,  Walks on the treadmill 5 days a week, active around the yard.     2. Hypertension- - BP is actually low now,  DC lisinopril  Check echo  3. Hyperlipidemia -    4. Mild Carotid artery disease.  no bruit on exam. Ian Hale had a lifeline screening which shows mild bilateral carotid artery disease. We'll consider formal Carotid artery  duplex at his next visit.  Current medicines are reviewed at length with the patient today.  The patient does not have concerns regarding medicines.  The following changes have been made:  no change  Labs/ tests ordered today include:  No orders of the defined types were placed in this encounter.    Disposition:   FU with  Me in 6 months      Kristeen MissPhilip Nahser, MD  10/27/2017 4:22 PM    [facility] [address], [place], [place]  [address] Phone: [phone]; Fax: [phone]

## 2017-10-27 NOTE — Patient Instructions (Signed)
Medication Instructions:  Your physician has recommended you make the following change in your medication: STOP Lisinopril   Labwork: None Ordered   Testing/Procedures: Your physician has requested that you have an echocardiogram. Echocardiography is a painless test that uses sound waves to create images of your heart. It provides your doctor with information about the size and shape of your heart and how well your heart's chambers and valves are working. This procedure takes approximately one hour. There are no restrictions for this procedure.   Follow-Up: Your physician wants you to follow-up in: 6 months with APP on Dr. Harvie BridgeNahser's team.  Bonita QuinYou will receive a reminder letter in the mail two months in advance. If you don't receive a letter, please call our office to schedule the follow-up appointment.   If you need a refill on your cardiac medications before your next appointment, please call your pharmacy.   Thank you for choosing [facility]! Eligha BridegroomMichelle Kaian Fahs, RN [phone]

## 2017-11-01 ENCOUNTER — Ambulatory Visit (HOSPITAL_COMMUNITY): Payer: Medicare Other | Attending: Internal Medicine

## 2017-11-01 ENCOUNTER — Other Ambulatory Visit: Payer: Self-pay

## 2017-11-01 DIAGNOSIS — I10 Essential (primary) hypertension: Secondary | ICD-10-CM

## 2017-11-01 DIAGNOSIS — Z951 Presence of aortocoronary bypass graft: Secondary | ICD-10-CM | POA: Insufficient documentation

## 2017-11-01 DIAGNOSIS — I77810 Thoracic aortic ectasia: Secondary | ICD-10-CM | POA: Insufficient documentation

## 2017-11-01 DIAGNOSIS — I2510 Atherosclerotic heart disease of native coronary artery without angina pectoris: Secondary | ICD-10-CM | POA: Diagnosis not present

## 2017-11-01 DIAGNOSIS — I119 Hypertensive heart disease without heart failure: Secondary | ICD-10-CM | POA: Insufficient documentation

## 2017-11-01 DIAGNOSIS — I358 Other nonrheumatic aortic valve disorders: Secondary | ICD-10-CM | POA: Insufficient documentation

## 2017-11-29 ENCOUNTER — Other Ambulatory Visit: Payer: Self-pay | Admitting: Cardiovascular Disease

## 2017-12-05 ENCOUNTER — Ambulatory Visit: Payer: Medicare Other | Admitting: Internal Medicine

## 2017-12-05 ENCOUNTER — Encounter: Payer: Self-pay | Admitting: Internal Medicine

## 2017-12-05 ENCOUNTER — Other Ambulatory Visit (INDEPENDENT_AMBULATORY_CARE_PROVIDER_SITE_OTHER): Payer: Medicare Other

## 2017-12-05 ENCOUNTER — Ambulatory Visit (INDEPENDENT_AMBULATORY_CARE_PROVIDER_SITE_OTHER): Payer: Medicare Other | Admitting: Internal Medicine

## 2017-12-05 VITALS — BP 122/68 | HR 60 | Ht 68.0 in | Wt 209.0 lb

## 2017-12-05 DIAGNOSIS — J84112 Idiopathic pulmonary fibrosis: Secondary | ICD-10-CM

## 2017-12-05 DIAGNOSIS — Z79899 Other long term (current) drug therapy: Secondary | ICD-10-CM | POA: Diagnosis not present

## 2017-12-05 LAB — PULMONARY FUNCTION TEST
DL/VA % PRED: 83 %
DL/VA: 3.71 ml/min/mmHg/L
DLCO UNC % PRED: 48 %
DLCO UNC: 14.4 ml/min/mmHg
FEF 25-75 Pre: 1.87 L/sec
FEF2575-%Pred-Pre: 108 %
FEV1-%Pred-Pre: 84 %
FEV1-Pre: 2.17 L
FEV1FVC-%Pred-Pre: 112 %
FEV6-%Pred-Pre: 80 %
FEV6-Pre: 2.73 L
FEV6FVC-%Pred-Pre: 107 %
FVC-%PRED-PRE: 74 %
FVC-Pre: 2.73 L
PRE FEV6/FVC RATIO: 100 %
Pre FEV1/FVC ratio: 80 %

## 2017-12-05 LAB — HEPATIC FUNCTION PANEL
ALK PHOS: 70 U/L (ref 39–117)
ALT: 12 U/L (ref 0–53)
AST: 12 U/L (ref 0–37)
Albumin: 3.9 g/dL (ref 3.5–5.2)
BILIRUBIN DIRECT: 0.1 mg/dL (ref 0.0–0.3)
BILIRUBIN TOTAL: 0.5 mg/dL (ref 0.2–1.2)
Total Protein: 7.2 g/dL (ref 6.0–8.3)

## 2017-12-05 NOTE — Patient Instructions (Addendum)
ICD-10-CM   1. IPF (idiopathic pulmonary fibrosis) (HCC) J84.112   2. Encounter for drug therapy Z79.899     Though symptom wise stable on breathing test things appear progressive - 7.8% drop since Jan 2019 and 16% drop since oct 2018  Plan - check lft  - change esbriet to ofev; continue esbriet till approval process for ofev is complete - test overnight o2 study (not sure why not tested after last visit)  - I emailed support group again with cc to you  Followup -6-8 weeks to review - KBILD and simple walk test (not )

## 2017-12-05 NOTE — Progress Notes (Signed)
Patient completed Cleda Daub and DLCO only today with PFT.

## 2017-12-05 NOTE — Progress Notes (Addendum)
Subjective:     Patient ID: Ian Hale, male   DOB: [birth year], 83 y.o.   MRN: [id number]  HPI  PCP Chilton Greathouse, MD  HPI   IOV 04/12/2017  Chief Complaint  Patient presents with  . Advice Only    Referred by Dr. Felipa Eth due to abn. cxr. Denies any cough, CP, or SOB.    Review of primary care physician notes from 02/22/2017 indicates that when he went there for routine annual physical exam the patient is active on a treadmill without any exertional difficulties. He does have coronary artery disease but without symptoms. His main issues with social stressors with his wife being on hospice therapy.. In particular he denied any cough, dyspnea, excessive sputum, hemoptysis, wheezing. This no family history of pulmonary fibrosis on the primary care physician notes. He status post bypass surgery he does have chronic kidney disease stage III and some mild PSA increase. When he saw primary care physician for this visit there was a chest x-ray performed earlier to this visit. This chest x-ray was performed in 02/01/2017 and it was reported as cardiomegaly with diffuse interstitial lung disease. I do not have these films for my review. In our medical record system he did have a chest x-ray in 2007 that was reported as pleural effusions. Again I could not visualize this film  Walking desaturation test 185 feet 3 laps on room at in our office. Resting heart rate was 59/m. Peak heart rate was 71/m. Resting pulse ox was 99%. Final pulse ox was 94%. He did not have significant desaturations but he did drop more than three points. Creatinine on 02/22/2017 was 1.6 mg percent and hemoglobin 16.4 mg percent    In talking to him today confirms the history of very little symptoms. In fact he tells me that he walks 30 minutes a day at least 3 times a week on treadmill at 4 miles an hour. Sometimes he is able to walk one hour and other days he is able to only walk 15 minutes. He just stopped due to fatigue but  has no shortness of breath or cough or wheezing. However, when asked him to do the KBILD - he did express mild dyspnea on exertion releived by rest.  During routine physical exam crackles was discovered and the chest x-ray showed ILD and therefore he has been referred.  He is a retired Airline pilot. His wife is 60 years old and he takes care of her in hospice.   [employer] of chest physicians interstitial lung disease questionnaire - Subjectively although to me directly he denied cough but on the questionnaire he did admit that he has occasional but not bothersome cough. He does not cough at night. And although he denied any dyspnea on the questionnaire he did admit that he gets short of breath when hurrying on level ground or walking up a slight hill. -Past medical history: Positive for acid reflux which is occasional easy bruising history of heart disease -Personally exposure history: Denies any smoking or illicit drug use. 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 Follow-up    PFT done today. Pt states that he has been doing good. Denies any cough, SOB, or CP. States that he does have postnasal drip.  FU ILD workup to discuss test reslts   His pulmonary function test shows only mild essential lung disease with the isolated reduction in diffusion capacity. His symptoms course are in the mild correlating with that. His high-resolution CT scan of the chest is PROABABLY UIP in my view. Other than the fact he does not have classic honeycombing is a classic UIP pattern in that it has bibasal pleural bilateral reticulation with basilar predominance with traction bronchiectasis. His autoimmune profile is essentially negative. The slightly high ANA is consistent  with age.   OV 06/09/2017  Chief Complaint  Patient presents with  . Follow-up    Follow-up after Esbriet start. States that he has been doing good. Denies any cough, SOB, or CP.   Follow-up idiopathic pulmonary fibrosis.  Esbriet prescribed given April 28, 2017  He was given samples of pirfenidone.  He took this.  He is now run out of it for the last 1 week.  He prefers to do insurance paperwork to start pirfenidone in January 2019 on account of his wife's illness.  His wife is age 63.  He tells me that he is tolerating pirfenidone just fine except for a new onset of small rash macular in his central chest area but this is not bothering him.  He is applying sunscreen regularly.  No nausea vomiting or diarrhea constipation.  He has a lot of questions about IPF its causes and natural history.   OV 08/14/2017  Chief Complaint  Patient presents with  . Follow-up    PFT done today.  Pt states he has been doing okay since his last visit. Pt still on Esbriet and has been doing fine on that.   Follow-up IPF.  He is now been on Esbriet for a few months.  Last liver function test end of 2018 was normal.  He tells me that he does have some nausea with this.  But it is controlled with cheese.  There is some increased fatigue and shortness of breath and recent times.  Otherwise doing okay.  He still continues to care for his 22 year old wife who is at home with hospice care.  He says wife vital signs are stable and she is not actively dying.  His goal is for him to stay as healthy as he can to take care of his wife.  His King's interstitial lung disease question is very similar to before but his spirometry shows a mild decline in a matter of few months.   Walking desaturation test on 08/14/2017 185 feet x 3 laps on ROOM AIR:  did NOT desaturate. Rest pulse ox was 100%, final pulse ox was 95%. HR response 59/min at rest to 75/min at peak exertion. Patient Ian Hale  Did not Desaturate < 88% . Christie A  [place] yes did  Desaturated </= 3% points. Rufino A Goding did not get tachyardic      OV 09/12/2017  Chief Complaint  Patient presents with  . Follow-up    PFT done today.  Pt states he believes his breathing is at a stable point.     Follow-up idiopathic pulmonary fibrosis.  IPF dx Sept/oct 2019.  Esbreit prescribed given April 28, 2017   Here with daughter; meeting first time. Tolerating esbriet well but for mild nausea for 90 min post intake. Annoyng but tolerable. Dyspnea stable. Not an issue . KBILD shows improvement. Main reason for 09/12/17 visit to see if PFT decline was a downtrend or not   OV 12/05/2017  Chief Complaint  Patient presents with  . Follow-up    PFT done today.  Pt states he has been doing good since last visit. Pt states SOB is about the same since last visit.     Follow-up idiopathic pulmonary fibrosis.  IPF dx Sept/oct 2019.  Esbreit prescribed given April 28, 2017    Follow-up IPF. Ian Hale presents for follow-up.  He is here with his daughter?  Lurena Joiner.  He tells me overall from a respiratory standpoint he is stable.  Shortness of breath with exertion is stable.  Not much of a cough.  He did go to [place] but it was humid and had slight increase in respiratory symptoms.  He is tolerating his Pirfenidone (Esbriet) quite well except for the mild nausea.  He does not think he has worsened at all.  However pulmonary function test below shows a decline of 7.8% since January 2019 in terms of FVC and a steady decline in both FVC and DLCO since October 2018.  I did give the news to him.  He also tells me that the patient support group leader has not contacted them yet.  He is very interested in it.  He was supposed to have overnight oxygen study but this did not happen   K-BILD ILD QUESTIONNAIRE, Symptom score over prior 2 weeks  7-none, 6-rarely, 5-occ, 5-some times, 3-sev times, 2-most times, 1-every time 04/12/2017  08/14/2017  09/12/2017    Dyspnea for stairs, incline or hill Chest Tightness Worry about seriousness of lung complaint Avoided doing things that make you dyspneic Have you felt loss of control of lung condition (reversed from original) Felt fed up due to lung condition Felt urge to breathe aka air hunger Has lung condition made you feel anxious How often have you experienced wheezing or whistling sound How much of the time have you felt your lung dz is getting worse How much has your lung condition interfered with job or daily task Were you expecting your lung condition to get worse How much has your lung function limited you carrying things like groceris How much has your lung function made you think of EOL? 6  4  Total     Are you financially worse off Grand Total        Results for SHADRACK, BRUMMITT (MRN [id number]) as of 12/05/2017 13:32  Ref. Range 04/28/2017 10:48 08/14/2017 10:49 09/12/2017 12:40 12/05/2017 11:41  FVC-Pre Latest Units: L 3.25 2.96 2.91 2.73 (16% decline since oct, 7.8% since jan)  FVC-%Pred-Pre Latest Units: % 88 80 79 74   Results for TOR, TSUDA (MRN [id number]) as of 12/05/2017 13:32  Ref. Range 04/28/2017 10:48 08/14/2017 10:49 09/12/2017 12:40 12/05/2017 11:41  DLCO unc Latest Units: ml/min/mmHg 15.10 13.45 13.40 14.40  DLCO unc % pred Latest Units: % 50 45 45 48     Simple office walk 185 feet x  3 laps goal with forehead probe 12/05/2017   O2 used Room air  Number laps completed 3  Comments about pace Normal pace  Resting Pulse Ox/HR 99% and 62/min  Final Pulse Ox/HR 91% and 76/min  Desaturated </= 88% no  Desaturated <=  3% points yes  Got Tachycardic >/= 90/min no  Symptoms at end of test Mild fatigue  Miscellaneous comments none      has a past medical history of Allergy, BPH (benign prostatic hyperplasia), CAD (coronary artery disease), Cholesteatoma of left ear, ED (erectile  dysfunction), Glaucoma, Glaucoma, Hearing loss of both ears, Hyperlipidemia, and Hypertension.   reports that he has never smoked. He has never used smokeless tobacco.  Past Surgical History:  Procedure Laterality Date  . CLEFT PALATE REPAIR  at birth  . CORONARY ARTERY BYPASS GRAFT  01/2006    No Known Allergies  Immunization History  Administered Date(s) Administered  . Influenza Whole 07/31/2007  . Influenza, High Dose Seasonal PF 09/22/2016, 05/18/2017  . Influenza-Unspecified 06/24/2013  . Pneumococcal Conjugate-13 10/29/2013  . Pneumococcal Polysaccharide-23 07/25/2001, 07/31/2007  . Td 07/25/2001  . Tdap 10/04/2011  . Zoster 07/31/2008    Family History  Problem Relation Age of Onset  . Heart failure Mother   . Depression Father   . Heart failure Father   . Diabetes Father      Current Outpatient Medications:  .  aspirin 81 MG tablet, Take 81 mg by mouth daily.  , Disp: , Rfl:  .  atorvastatin (LIPITOR) 80 MG tablet, TAKE 1 TABLET(80 MG) BY MOUTH DAILY, Disp: 90 tablet, Rfl: 2 .  bimatoprost (LUMIGAN) 0.01 % SOLN, Place 1 drop into both eyes at bedtime., Disp: , Rfl:  .  cyanocobalamin 2000 MCG tablet, Take 2,000 mcg by mouth 2 (two) times daily., Disp: , Rfl:  .  ESBRIET 801 MG TABS, TAKE 1 TABLET (801MG ) BY MOUTH THREE TIMES A DAY WITH FOOD, Disp: 90 tablet, Rfl: 12 .  furosemide (LASIX) 20 MG tablet, TAKE 1 TABLET(20 MG) BY MOUTH DAILY, Disp: 90 tablet, Rfl: 3 .  metoprolol tartrate (LOPRESSOR) 25 MG tablet, TAKE 1/2 TABLET(12.5 MG) BY MOUTH TWICE DAILY, Disp: 90 tablet, Rfl: 3 .  Omega-3 Fatty Acids (FISH OIL PO), Take 2 capsules by mouth. Pt takes 4000mg , Disp: , Rfl:  .  potassium chloride SA (K-DUR,KLOR-CON) 20 MEQ tablet, TAKE 1/2 TABLET BY MOUTH DAILY, Disp: 90 tablet, Rfl: 0 .  saw palmetto 160 MG capsule, Take 160 mg by mouth 2 (two) times daily.  , Disp: , Rfl:  .  nitroGLYCERIN (NITROSTAT) 0.4 MG SL tablet, Place 1 tablet (0.4 mg total) under the tongue  every 5 (five) minutes as needed. (Patient not taking: Reported on 12/05/2017), Disp: 25 tablet, Rfl: 1   Review of Systems     Objective:   Physical Exam  Constitutional: He is oriented to person, place, and time. He appears well-developed and well-nourished. No distress.  HENT:  Head: Normocephalic and atraumatic.  Right Ear: External ear normal.  Left Ear: External ear normal.  Mouth/Throat: Oropharynx is clear and moist. No oropharyngeal exudate.  Cleft lip scar  Eyes: Pupils are equal, round, and reactive to light. Conjunctivae and EOM are normal. Right eye exhibits no discharge. Left eye exhibits no discharge. No scleral icterus.  Neck: Normal range of motion. Neck supple. No JVD present. No tracheal deviation present. No thyromegaly present.  Cardiovascular: Normal rate, regular rhythm and intact distal pulses. Exam reveals no gallop and no friction rub.  No murmur heard. Pulmonary/Chest: Effort normal. No respiratory distress. He has no wheezes. He has rales. He exhibits no tenderness.  Abdominal: Soft. Bowel sounds are normal. He exhibits no distension and no mass. There is no tenderness. There is no rebound and no  guarding.  Musculoskeletal: Normal range of motion. He exhibits no edema or tenderness.  Lymphadenopathy:    He has no cervical adenopathy.  Neurological: He is alert and oriented to person, place, and time. He has normal reflexes. No cranial nerve deficit. Coordination normal.  Skin: Skin is warm and dry. No rash noted. He is not diaphoretic. No erythema. No pallor.  Psychiatric: He has a normal mood and affect. His behavior is normal. Judgment and thought content normal.  Nursing note and vitals reviewed.  Vitals:   12/05/17 1318  BP: 122/68  Pulse: 60  SpO2: 95%  Weight: 209 lb (94.8 kg)  Height:  (1.727 m)    Estimated body mass index is 31.78 kg/m as calculated from the following:   Height as of this encounter:  (1.727 m).   Weight as of this  encounter: 209 lb (94.8 kg).      Assessment:       ICD-10-CM   1. IPF (idiopathic pulmonary fibrosis) (HCC) J84.112   2. Encounter for drug therapy Z79.899        Plan:       Though symptom wise stable on breathing test things appear progressive - 7.8% drop since Jan 2019 and 16% drop since oct 2018  Plan - check lft  - change esbriet to ofev; continue esbriet till approval process for ofev is complete (he reports he did have recent cardiac evaluation and it was reassuring.  He is not on any anticoagulants he does not have any ulcer disease] - test overnight o2 study (not sure why not tested after last visit)  - I emailed support group again with cc to you  Followup -6-8 weeks to review - KBILD and simple walk test (not )   > 50% of this > 25 min visit spent in face to face counseling or coordination of care    Dr. Kalman Shan, M.D., [employer]C.P Pulmonary and Critical Care Medicine Staff Physician, [facility] Center Director - Interstitial Lung Disease  Program  Pulmonary Fibrosis [facility] Network at [facility] [place], [place], [address]  Pager: [phone], If no answer or between  15:00h - 7:00h: call [phone] Telephone: [phone]

## 2017-12-06 ENCOUNTER — Telehealth: Payer: Self-pay | Admitting: Internal Medicine

## 2017-12-06 NOTE — Telephone Encounter (Signed)
Notes recorded by Kalman Shan, MD on 12/05/2017 at 3:49 PM EDT Lab       12/05/17           1425     AST     12      ALT     12      ALKPHOS   70      BILITOT   0.5      PROT     7.2      ALBUMIN   3.9       LFT normal  -------------------------------------- Spoke with pt. He is aware of results. Nothing further was needed.

## 2017-12-07 ENCOUNTER — Telehealth: Payer: Self-pay | Admitting: Internal Medicine

## 2017-12-07 NOTE — Telephone Encounter (Signed)
PA initiated for OFEV via covermymeds.  Key: [id number]  Via covermymeds, it said we could receive a response within 72 hours.  Within 15 minutes after PA was initiated, we received a favorable outcome stating that the OFEV has been approved for pt through 07/24/18.

## 2017-12-07 NOTE — Telephone Encounter (Signed)
Called and spoke with Marchelle Folks from [facility]. She states that there was a prescription of the OFEV sent in but the signature was not finished.    MR please advise on the quantity, strength, refill and dosage for this patient. Thank you.

## 2017-12-07 NOTE — Telephone Encounter (Signed)
Called [facility] and spoke with Gunnar Fusi who stated the date was left off beside physician's signature on the enrollment application for OFEV.  I went ahead and gave verbal information to Burien who stated to me she would go ahead and begin processing pt's information.  Will keep encounter open until everything has been taken care of with pt's med.

## 2017-12-12 ENCOUNTER — Encounter: Payer: Self-pay | Admitting: Internal Medicine

## 2017-12-15 NOTE — Telephone Encounter (Signed)
Faxed Rx to [facility]. Will place in Emily's box in case it isn't received or any issue to follow up on.

## 2017-12-15 NOTE — Telephone Encounter (Signed)
This is signed and is on blue part of my  Desk  Dr. Kalman Shan, M.D., [facility].C.P Pulmonary and Critical Care Medicine Staff Physician, [facility] Center Director - Interstitial Lung Disease  Program  Pulmonary Fibrosis [facility] Network at [facility] [place], [place], [address]  Pager: [phone], If no answer or between  15:00h - 7:00h: call [phone] Telephone: [phone]

## 2017-12-26 NOTE — Telephone Encounter (Signed)
Pt was approved for the OFEV. Nothing further needed.

## 2018-01-03 ENCOUNTER — Telehealth: Payer: Self-pay | Admitting: Internal Medicine

## 2018-01-03 NOTE — Telephone Encounter (Signed)
Ofev can function as a weak anti-coagulant. So while it might not be the primary reason for nose bleed (theh high bp is) the combination of ofev with baby aspirin probably making it easier for the bleeding.  Having said above - pretty much first case of bleeding with ofev I am seeing.   Plan - hold ofev for 1 week and then restart (should be out of system in 1 day though)  - monitor bp - keep appt 01/17/18 - if nose bleed bad - got to ER - keep nose moist with saline gel or spray  Dr. Kalman ShanMurali Cameran Pettey, M.D., [facility].C.P Pulmonary and Critical Care Medicine Staff Physician, [facility] Center Director - Interstitial Lung Disease  Program  Pulmonary Fibrosis [facility] at [facility] [place], [place], [id number]  Pager: [phone], If no answer or between  15:00h - 7:00h: call [phone] Telephone: [phone]  '

## 2018-01-03 NOTE — Telephone Encounter (Signed)
Spoke with patient. He stated that he stated his Ofev on 12/29/17. Since then, his BP has been elevated and he has had several nosebleeds. He first checked his BP at 1am on 12/30/17 and it was 194/88, also had his first nosebleed then. Denies taking other medications or trauma to his nose. Also states that he has not being any strenuous activity.   He wants to know if he needs to stop taking the medication. Per patient, he has not had another nosebleed since 6/10 and is BP is slowing decreasing from the 194/88.   VS, please advise since MR is not in the office today. Thanks!

## 2018-01-03 NOTE — Telephone Encounter (Signed)
If his nose isn't bleeding at present and his blood pressure is better, then this can wait to be addressed by Dr. Marchelle Gearingamaswamy.

## 2018-01-03 NOTE — Telephone Encounter (Signed)
Called and spoke with patient, advised him of MR response. Nothing further needed.

## 2018-01-03 NOTE — Telephone Encounter (Signed)
Spoke with the pt and advised that his msg will be addressed by MR per VS  He felt okay with this  MR- please advise thanks!

## 2018-01-10 ENCOUNTER — Encounter: Payer: Self-pay | Admitting: Cardiovascular Disease

## 2018-01-17 ENCOUNTER — Other Ambulatory Visit (INDEPENDENT_AMBULATORY_CARE_PROVIDER_SITE_OTHER): Payer: Medicare Other

## 2018-01-17 ENCOUNTER — Encounter: Payer: Self-pay | Admitting: Internal Medicine

## 2018-01-17 ENCOUNTER — Telehealth: Payer: Self-pay | Admitting: Internal Medicine

## 2018-01-17 ENCOUNTER — Ambulatory Visit: Payer: Medicare Other | Admitting: Internal Medicine

## 2018-01-17 VITALS — BP 134/70 | HR 58 | Ht 68.0 in | Wt 205.0 lb

## 2018-01-17 DIAGNOSIS — Z79899 Other long term (current) drug therapy: Secondary | ICD-10-CM | POA: Diagnosis not present

## 2018-01-17 DIAGNOSIS — R040 Epistaxis: Secondary | ICD-10-CM | POA: Diagnosis not present

## 2018-01-17 DIAGNOSIS — J84112 Idiopathic pulmonary fibrosis: Secondary | ICD-10-CM

## 2018-01-17 DIAGNOSIS — I10 Essential (primary) hypertension: Secondary | ICD-10-CM | POA: Diagnosis not present

## 2018-01-17 LAB — HEPATIC FUNCTION PANEL
ALBUMIN: 4.2 g/dL (ref 3.5–5.2)
ALT: 18 U/L (ref 0–53)
AST: 15 U/L (ref 0–37)
Alkaline Phosphatase: 102 U/L (ref 39–117)
Bilirubin, Direct: 0.3 mg/dL (ref 0.0–0.3)
Total Bilirubin: 1 mg/dL (ref 0.2–1.2)
Total Protein: 7.9 g/dL (ref 6.0–8.3)

## 2018-01-17 NOTE — Progress Notes (Signed)
LMOMTCB x 1 

## 2018-01-17 NOTE — Patient Instructions (Addendum)
IPF (idiopathic pulmonary fibrosis) (HCC) Encounter for drug therapy   - progressive disease based on symptoms, pft and walk test - at some point you might end up on oxygen  - esbriet failure  - too early to tell if ofev is having an impact - Glad you have restarted ofev last week; please continue with it - Glad ofev seems to be helping her shortness of breath which is normally not the case - do LFT blood work 01/17/2018 + repeat in 4-6 weeks - CMA to get me your ONO test result - in 4-6 weeks do Pre-bd spiro and dlco only. No lung volume or bd response. No post-bd spiro - return in 4 - 6 weeks to ILD clinic - we did discuss research trials and thought  [place] are good options   Epistaxis - new issue - Ofev was not the cause of this - High BP in setting of aspirin and ofev brough it on  - glad is resolved - because is 1 time and you have life threatening fibrosis,  You should not stop ofev but should look to ensure nose bleed does not return - therefore refer ENT  Essential hypertension - new issue to me - worse after stopping bp med -bp readings are all high but for 2  - please contact your cardiologist for better control esp with prior hx of bypass  Followup 4-6 weeks ILD clinic; spirometry and simple walk (not 6wd) at that time.

## 2018-01-17 NOTE — Progress Notes (Signed)
Subjective:     Patient ID: Ian Hale, male   DOB: [birth year], 83 y.o.   MRN: [id number]  HPI  PCP Chilton Greathouse, MD  HPI   IOV 04/12/2017  Chief Complaint  Patient presents with  . Advice Only    Referred by Dr. Felipa Eth due to abn. cxr. Denies any cough, CP, or SOB.    Review of primary care physician notes from 02/22/2017 indicates that when he went there for routine annual physical exam the patient is active on a treadmill without any exertional difficulties. He does have coronary artery disease but without symptoms. His main issues with social stressors with his wife being on hospice therapy.. In particular he denied any cough, dyspnea, excessive sputum, hemoptysis, wheezing. This no family history of pulmonary fibrosis on the primary care physician notes. He status post bypass surgery he does have chronic kidney disease stage III and some mild PSA increase. When he saw primary care physician for this visit there was a chest x-ray performed earlier to this visit. This chest x-ray was performed in 02/01/2017 and it was reported as cardiomegaly with diffuse interstitial lung disease. I do not have these films for my review. In our medical record system he did have a chest x-ray in 2007 that was reported as pleural effusions. Again I could not visualize this film  Walking desaturation test 185 feet 3 laps on room at in our office. Resting heart rate was 59/m. Peak heart rate was 71/m. Resting pulse ox was 99%. Final pulse ox was 94%. He did not have significant desaturations but he did drop more than three points. Creatinine on 02/22/2017 was 1.6 mg percent and hemoglobin 16.4 mg percent    In talking to him today confirms the history of very little symptoms. In fact he tells me that he walks 30 minutes a day at least 3 times a week on treadmill at 4 miles an hour. Sometimes he is able to walk one hour and other days he is able to only walk 15 minutes. He just stopped due to fatigue but  has no shortness of breath or cough or wheezing. However, when asked him to do the KBILD - he did express mild dyspnea on exertion releived by rest.  During routine physical exam crackles was discovered and the chest x-ray showed ILD and therefore he has been referred.  He is a retired Airline pilot. His wife is 60 years old and he takes care of her in hospice.   [employer] of chest physicians interstitial lung disease questionnaire - Subjectively although to me directly he denied cough but on the questionnaire he did admit that he has occasional but not bothersome cough. He does not cough at night. And although he denied any dyspnea on the questionnaire he did admit that he gets short of breath when hurrying on level ground or walking up a slight hill. -Past medical history: Positive for acid reflux which is occasional easy bruising history of heart disease -Personally exposure history: Denies any smoking or illicit drug use. 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 Follow-up    PFT done today. Pt states that he has been doing good. Denies any cough, SOB, or CP. States that he does have postnasal drip.  FU ILD workup to discuss test reslts   His pulmonary function test shows only mild essential lung disease with the isolated reduction in diffusion capacity. His symptoms course are in the mild correlating with that. His high-resolution CT scan of the chest is PROABABLY UIP in my view. Other than the fact he does not have classic honeycombing is a classic UIP pattern in that it has bibasal pleural bilateral reticulation with basilar predominance with traction bronchiectasis. His autoimmune profile is essentially negative. The slightly high ANA is consistent  with age.   OV 06/09/2017  Chief Complaint  Patient presents with  . Follow-up    Follow-up after Esbriet start. States that he has been doing good. Denies any cough, SOB, or CP.   Follow-up idiopathic pulmonary fibrosis.  Esbriet prescribed given April 28, 2017  He was given samples of pirfenidone.  He took this.  He is now run out of it for the last 1 week.  He prefers to do insurance paperwork to start pirfenidone in January 2019 on account of his wife's illness.  His wife is age 63.  He tells me that he is tolerating pirfenidone just fine except for a new onset of small rash macular in his central chest area but this is not bothering him.  He is applying sunscreen regularly.  No nausea vomiting or diarrhea constipation.  He has a lot of questions about IPF its causes and natural history.   OV 08/14/2017  Chief Complaint  Patient presents with  . Follow-up    PFT done today.  Pt states he has been doing okay since his last visit. Pt still on Esbriet and has been doing fine on that.   Follow-up IPF.  He is now been on Esbriet for a few months.  Last liver function test end of 2018 was normal.  He tells me that he does have some nausea with this.  But it is controlled with cheese.  There is some increased fatigue and shortness of breath and recent times.  Otherwise doing okay.  He still continues to care for his 22 year old wife who is at home with hospice care.  He says wife vital signs are stable and she is not actively dying.  His goal is for him to stay as healthy as he can to take care of his wife.  His King's interstitial lung disease question is very similar to before but his spirometry shows a mild decline in a matter of few months.   Walking desaturation test on 08/14/2017 185 feet x 3 laps on ROOM AIR:  did NOT desaturate. Rest pulse ox was 100%, final pulse ox was 95%. HR response 59/min at rest to 75/min at peak exertion. Patient Ian Hale  Did not Desaturate < 88% . Ian A  Hale yes did  Desaturated </= 3% points. Ian Hale did not get tachyardic      OV 09/12/2017  Chief Complaint  Patient presents with  . Follow-up    PFT done today.  Pt states he believes his breathing is at a stable point.     Follow-up idiopathic pulmonary fibrosis.  IPF dx Sept/oct 2019.  Esbreit prescribed given April 28, 2017   Here with daughter; meeting first time. Tolerating esbriet well but for mild nausea for 90 min post intake. Annoyng but tolerable. Dyspnea stable. Not an issue . KBILD shows improvement. Main reason for 09/12/17 visit to see if PFT decline was a downtrend or not   OV 12/05/2017  Chief Complaint  Patient presents with  . Follow-up    PFT done today.  Pt states he has been doing good since last visit. Pt states SOB is about the same since last visit.     Follow-up idiopathic pulmonary fibrosis.  IPF dx Sept/oct 2019.  Esbreit prescribed given April 28, 2017    Follow-up IPF. Ian Hale presents for follow-up.  He is here with his daughter?  Ian Hale.  He tells me overall from a respiratory standpoint he is stable.  Shortness of breath with exertion is stable.  Not much of a cough.  [place] go [place] but it was humid and had slight increase in respiratory symptoms.  He is tolerating his Pirfenidone (Esbriet) quite well except for the mild nausea.  He does not think he has worsened at all.  However pulmonary function test below shows a decline of 7.8% since January 2019 in terms of FVC and a steady decline in both FVC and DLCO since October 2018.  I did give the news to him.  He also tells me that the patient support group leader has not contacted them yet.  He is very interested in it.  He was supposed to have overnight oxygen study but this did not happen   OV 01/17/2018    Chief Complaint  Patient presents with  . Follow-up    Pt [facility] and states he had been doing well on the OFEV. Pt states he had a  nosebleed 6/3 and also had a high BP of 196/80 and due to that, pt's OFEV was stopped. Pt began taking OFEV again 6/21.  Pt states he believes his condition has become worse.    Follow-up idiopathic pulmonary fibrosis.  IPF dx Sept/oct 2019.  Esbreit prescribed given April 28, 2017. Swtiched to ofev may 2019 due to progressive delcine in lung function. sarted ofev early June 2019 and interrrupted and again late June 2019    Ian Hale presence of interstitial lung disease clinic for follow-up of his idiopathic pulmonary fibrosis. Last visit 12/05/2017. History is gained from talking to him, his daughter and review and summarization of the old chart. At last visit I [facility] him [facility] due to progressive worsening of pulmonary fibrosis. He started taking his Ofev in early June 2019.if on this medication vertigo very beneficial in terms of his symptoms started improving and shortness of breath. This is not supposed to be the case and he knows thatbut he did say that it did help her shortness of breath. However around this time his primaryprimary cardiologist stopped his antihypertensive according to his history and review of the chart confirms it. And then his blood pressure went up to 190 systolic and then he had epistaxis. He could not differentiate if the epistaxis was due to rising blood pressure or ofev which theoretically has a weak anticoagulant effect possibly similar to aspirin. Because of this I had him hold his ofev ust for a few days but he held it for longer and then started having worsening shortness of breath got worse since the last week he restarted ofev and shortness of breath is better. No further epistaxis.He showed his home blood pressure recordings and this continues to be high.walking desaturation test does indeed confirm that ascending to desaturate more easily although still adequate. He had his overnight oxygen study but I do not have these results with  me.     K-BILD ILD QUESTIONNAIRE,  Symptom score over prior 2 weeks  7-none, 6-rarely, 5-occ, 5-some times, 3-sev times, 2-most times, 1-every time 04/12/2017  08/14/2017  09/12/2017   Dyspnea for stairs, incline or hill 5 6 6   Chest Tightness 5 5 6   Worry about seriousness of lung complaint 6 5 5   Avoided doing things that make you dyspneic 6 5 5   Have you felt loss of control of lung condition (reversed from original) 6 6 6   Felt fed up due to lung condition 7 5 6   Felt urge to breathe aka air hunger 7 6 6   Has lung condition made you feel anxious 6 5 6   How often have you experienced wheezing or whistling sound 6 5 6   How much of the time have you felt your lung dz is getting worse 6 5 5   How much has your lung condition interfered with job or daily task 6 5 5   Were you expecting your lung condition to get worse 6 6 5   How much has your lung function limited you carrying things like groceris 7 4 6   How much has your lung function made you think of EOL? 6  4  Total     Are you financially worse off 7 6 6   Grand Total        Results for Ian Hale, Ian Hale (MRN [id number]) as of 12/05/2017 13:32  Ref. Range 04/28/2017 10:48 08/14/2017 10:49 09/12/2017 12:40 12/05/2017 11:41  FVC-Pre Latest Units: L 3.25 2.96 2.91 2.73 (16% decline since oct, 7.8% since jan)  FVC-%Pred-Pre Latest Units: % 88 80 79 74   Results for Ian Hale, Ian Hale (MRN [id number]) as of 12/05/2017 13:32  Ref. Range 04/28/2017 10:48 08/14/2017 10:49 09/12/2017 12:40 12/05/2017 11:41  DLCO unc Latest Units: ml/min/mmHg 15.10 13.45 13.40 14.40  DLCO unc % pred Latest Units: % 50 45 45 48     Simple office walk 185 feet x  3 laps goal with forehead probe 12/05/2017  01/17/2018   O2 used Room air Room air  Number laps completed 3 3  Comments about pace Normal pace Normal pace  Resting Pulse Ox/HR 99% and 62/min 99% and 60/min  Final Pulse Ox/HR 91% and 76/min 89% and 68/in  Desaturated </= 88% no no  Desaturated <= 3% points  yes ytes  Got Tachycardic >/= 90/min no no  Symptoms at end of test Mild fatigue Moderate dyspnea  Miscellaneous comments none worse       has a past medical history of Allergy, BPH (benign prostatic hyperplasia), CAD (coronary artery disease), Cholesteatoma of left ear, ED (erectile dysfunction), Glaucoma, Glaucoma, Hearing loss of both ears, Hyperlipidemia, and Hypertension.   reports that he has never smoked. He has never used smokeless tobacco.  Past Surgical History:  Procedure Laterality Date  . CLEFT PALATE REPAIR  at birth  . CORONARY ARTERY BYPASS GRAFT  01/2006    No Known Allergies  Immunization History  Administered Date(s) Administered  . Influenza Whole 07/31/2007  . Influenza, High Dose Seasonal PF 09/22/2016, 05/18/2017  . Influenza-Unspecified 06/24/2013  . Pneumococcal Conjugate-13 10/29/2013  . Pneumococcal Polysaccharide-23 07/25/2001, 07/31/2007  . Td 07/25/2001  . Tdap 10/04/2011  . Zoster 07/31/2008    Family History  Problem Relation Age of Onset  . Heart failure Mother   . Depression Father   . Heart failure Father   . Diabetes Father      Current Outpatient Medications:  .  aspirin 81 MG tablet, Take 81 mg by mouth daily.  ,  Disp: , Rfl:  .  atorvastatin (LIPITOR) 80 MG tablet, TAKE 1 TABLET(80 MG) BY MOUTH DAILY, Disp: 90 tablet, Rfl: 2 .  bimatoprost (LUMIGAN) 0.01 % SOLN, Place 1 drop into both eyes at bedtime., Disp: , Rfl:  .  cyanocobalamin 2000 MCG tablet, Take 2,000 mcg by mouth 2 (two) times daily., Disp: , Rfl:  .  furosemide (LASIX) 20 MG tablet, TAKE 1 TABLET(20 MG) BY MOUTH DAILY, Disp: 90 tablet, Rfl: 3 .  metoprolol tartrate (LOPRESSOR) 25 MG tablet, TAKE 1/2 TABLET(12.5 MG) BY MOUTH TWICE DAILY, Disp: 90 tablet, Rfl: 3 .  Omega-3 Fatty Acids (FISH OIL PO), Take 2 capsules by mouth. Pt takes 4000mg , Disp: , Rfl:  .  potassium chloride SA (K-DUR,KLOR-CON) 20 MEQ tablet, TAKE 1/2 TABLET BY MOUTH DAILY, Disp: 90 tablet, Rfl: 0 .   saw palmetto 160 MG capsule, Take 160 mg by mouth 2 (two) times daily.  , Disp: , Rfl:  .  nitroGLYCERIN (NITROSTAT) 0.4 MG SL tablet, Place 1 tablet (0.4 mg total) under the tongue every 5 (five) minutes as needed. (Patient not taking: Reported on 12/05/2017), Disp: 25 tablet, Rfl: 1 .  OFEV 150 MG CAPS, , Disp: , Rfl:    Review of Systems     Objective:   Physical Exam   Vitals:   01/17/18 1102  BP: 134/70  Pulse: (!) 58  SpO2: 95%  Weight: 205 lb (93 kg)  Height: 5\' 8"  (1.727 m)    Estimated body mass index is 31.17 kg/m as calculated from the following:   Height as of this encounter: 5\' 8"  (1.727 m).   Weight as of this encounter: 205 lb (93 kg).     Assessment:       ICD-10-CM   1. IPF (idiopathic pulmonary fibrosis) (HCC) J84.112   2. Encounter for drug therapy Z79.899   3. Epistaxis R04.0   4. Essential hypertension I10     Plan:      IPF (idiopathic pulmonary fibrosis) (HCC) Encounter for drug therapy   - progressive disease based on symptoms, pft and walk test - at some point you might end up on oxygen  - esbriet failure  - too early to tell if ofev is having an impact - Glad you have restarted ofev last week; please continue with it - Glad ofev seems to be helping her shortness of breath which is normally not the case - do LFT blood work 01/17/2018 + repeat in 4-6 weeks - CMA to get me your ONO test result - in 4-6 weeks do Pre-bd spiro and dlco only. No lung volume or bd response. No post-bd spiro - return in 4 - 6 weeks to ILD clinic  - we did discuss research trials and thought  [place] and [place] trials are good options  Epistaxis - new issue - Ofev was not the cause of this - High BP in setting of aspirin and ofev brough it on  - glad is resolved - because is 1 time and you have life threatening fibrosis,  You should not stop ofev but should look to ensure nose bleed does not return - therefore refer ENT  Essential hypertension  -bp  readings are all high but for 2  - please contact your cardiologist for better control esp with prior hx of bypass  Followup 4-6 weeks ILD clinic; spirometry and simple walk (not 6wd) at that time.       Dr. Kalman Shan, M.D., [facility].C.P Pulmonary and Critical Care  Medicine Staff Physician, [place] Director - Interstitial Lung Disease  Program  Pulmonary [place] at [place], [place], [address]  Pager: [phone], If no answer or between  15:00h - 7:00h: call [phone] Telephone: [phone]

## 2018-01-17 NOTE — Telephone Encounter (Signed)
Sounds good

## 2018-01-17 NOTE — Telephone Encounter (Signed)
[facility] spoke with Barbara CowerJason asking if he was able to see if pt had ONO performed.  Per Barbara CowerJason, pt had ONO performed 5/21.  Pt does not qualify for O2 at night due to only dropping below 88% for about 4min.  Even though it is past the 30days, due to pt not qualifying for O2, I asked Barbara CowerJason to fax the results to our office so that way MR can still visually see the results from the ONO.  Pt has been made aware of the results and that he does not qualify for O2.  Will route this encounter to MR and give him the results once [facility]

## 2018-01-25 ENCOUNTER — Encounter: Payer: Self-pay | Admitting: Cardiovascular Disease

## 2018-03-02 ENCOUNTER — Encounter: Payer: Self-pay | Admitting: Internal Medicine

## 2018-03-06 ENCOUNTER — Other Ambulatory Visit: Payer: Self-pay

## 2018-03-06 MED ORDER — POTASSIUM CHLORIDE CRYS ER 20 MEQ PO TBCR: 10.0000 meq | EXTENDED_RELEASE_TABLET | Freq: Every day | ORAL | 2 refills | Status: DC

## 2018-03-13 ENCOUNTER — Telehealth (HOSPITAL_COMMUNITY): Payer: Self-pay

## 2018-03-13 ENCOUNTER — Ambulatory Visit: Payer: Medicare Other | Admitting: Internal Medicine

## 2018-03-13 NOTE — Telephone Encounter (Signed)
Patient returned call and will come in for Pulmonary Rehab orientation on 04/04/18 @ 1:30PM and will attend the 1:30PM exercise class.  Mailed homework package.  Went over insurance, patient verbalized understanding.

## 2018-03-13 NOTE — Telephone Encounter (Signed)
Attempted to contact patient in regards to Pulmonary Rehab - lm on vm °

## 2018-03-16 ENCOUNTER — Encounter: Payer: Self-pay | Admitting: Internal Medicine

## 2018-03-16 ENCOUNTER — Ambulatory Visit (INDEPENDENT_AMBULATORY_CARE_PROVIDER_SITE_OTHER): Payer: Medicare Other | Admitting: Internal Medicine

## 2018-03-16 ENCOUNTER — Ambulatory Visit: Payer: Medicare Other | Admitting: Internal Medicine

## 2018-03-16 ENCOUNTER — Other Ambulatory Visit (INDEPENDENT_AMBULATORY_CARE_PROVIDER_SITE_OTHER): Payer: Medicare Other

## 2018-03-16 VITALS — BP 126/72 | HR 65 | Ht 67.5 in | Wt 207.0 lb

## 2018-03-16 DIAGNOSIS — J84112 Idiopathic pulmonary fibrosis: Secondary | ICD-10-CM

## 2018-03-16 DIAGNOSIS — Z79899 Other long term (current) drug therapy: Secondary | ICD-10-CM

## 2018-03-16 LAB — PULMONARY FUNCTION TEST
DL/VA % pred: 84 %
DL/VA: 3.75 ml/min/mmHg/L
DLCO unc % pred: 43 %
DLCO unc: 12.54 ml/min/mmHg
FEF 25-75 PRE: 1.67 L/s
FEF2575-%Pred-Pre: 99 %
FEV1-%PRED-PRE: 74 %
FEV1-Pre: 1.85 L
FEV1FVC-%Pred-Pre: 113 %
FEV6-%PRED-PRE: 69 %
FEV6-PRE: 2.29 L
FEV6FVC-%Pred-Pre: 107 %
FVC-%Pred-Pre: 64 %
FVC-PRE: 2.29 L
PRE FEV1/FVC RATIO: 81 %
Pre FEV6/FVC Ratio: 100 %

## 2018-03-16 LAB — HEPATIC FUNCTION PANEL
ALBUMIN: 4.1 g/dL (ref 3.5–5.2)
ALK PHOS: 78 U/L (ref 39–117)
ALT: 18 U/L (ref 0–53)
AST: 14 U/L (ref 0–37)
Bilirubin, Direct: 0.2 mg/dL (ref 0.0–0.3)
Total Bilirubin: 1.2 mg/dL (ref 0.2–1.2)
Total Protein: 7.4 g/dL (ref 6.0–8.3)

## 2018-03-16 NOTE — Progress Notes (Signed)
PFT completed today 03/16/18.  

## 2018-03-16 NOTE — Patient Instructions (Addendum)
ICD-10-CM   1. IPF (idiopathic pulmonary fibrosis) (HCC) J84.112 Ambulatory Referral for DME  2. Encounter for drug therapy Z79.899     Ipf worse - 30% loss of FVC in 10 months with half of that decline coming in last 3 months This is despite ofev which I am glad you are tolerating well Your disease is very aggressive I am very worried about this trajectory   Plan  - start o2 at day time 2L [place] with exertion; portable system - repeat ONO at night  - and if abnormal start night o2 - continue ofev 150mg  twice daily with food - check LFT 03/16/2018 - agree starting rehab as referred by Dr Felipa EthAvva - research is a care option for you to save your lung  - best study we can offer you is [employer] inhaler study; please take copy of consent - you need a HRCT scan but we will decide on timing next 6-8 weeks  Followup Thursday morning ILD clinic in 4-8 weeks

## 2018-03-16 NOTE — Progress Notes (Signed)
Subjective:     Patient ID: Ian Hale, male   DOB: 11-10-1934, 83 y.o.   MRN: [id number]  HPI   PCP Chilton Greathouse, MD  HPI   IOV 04/12/2017  Chief Complaint  Patient presents with  . Advice Only    Referred by Dr. Felipa Eth due to abn. cxr. Denies any cough, CP, or SOB.    Review of primary care physician notes from 02/22/2017 indicates that when he went there for routine annual physical exam the patient is active on a treadmill without any exertional difficulties. He does have coronary artery disease but without symptoms. His main issues with social stressors with his wife being on hospice therapy.. In particular he denied any cough, dyspnea, excessive sputum, hemoptysis, wheezing. This no family history of pulmonary fibrosis on the primary care physician notes. He status post bypass surgery he does have chronic kidney disease stage III and some mild PSA increase. When he saw primary care physician for this visit there was a chest x-ray performed earlier to this visit. This chest x-ray was performed in 02/01/2017 and it was reported as cardiomegaly with diffuse interstitial lung disease. I do not have these films for my review. In our medical record system he did have a chest x-ray in 2007 that was reported as pleural effusions. Again I could not visualize this film  Walking desaturation test 185 feet 3 laps on room at in our office. Resting heart rate was 59/m. Peak heart rate was 71/m. Resting pulse ox was 99%. Final pulse ox was 94%. He did not have significant desaturations but he did drop more than three points. Creatinine on 02/22/2017 was 1.6 mg percent and hemoglobin 16.4 mg percent    In talking to him today confirms the history of very little symptoms. In fact he tells me that he walks 30 minutes a day at least 3 times a week on treadmill at 4 miles an hour. Sometimes he is able to walk one hour and other days he is able to only walk 15 minutes. He just stopped due to fatigue but  has no shortness of breath or cough or wheezing. However, when asked him to do the KBILD - he did express mild dyspnea on exertion releived by rest.  During routine physical exam crackles was discovered and the chest x-ray showed ILD and therefore he has been referred.  He is a retired Airline pilot. His wife is 31 years old and he takes care of her in hospice.   [employer] of chest physicians interstitial lung disease questionnaire - Subjectively although to me directly he denied cough but on the questionnaire he did admit that he has occasional but not bothersome cough. He does not cough at night. And although he denied any dyspnea on the questionnaire he did admit that he gets short of breath when hurrying on level ground or walking up a slight hill. -Past medical history: Positive for acid reflux which is occasional easy bruising history of heart disease -Personally exposure history: Denies any smoking or illicit drug use. 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 Follow-up    PFT done today. Pt states that he has been doing good. Denies any cough, SOB, or CP. States that he does have postnasal drip.  FU ILD workup to discuss test reslts   His pulmonary function test shows only mild essential lung disease with the isolated reduction in diffusion capacity. His symptoms course are in the mild correlating with that. His high-resolution CT scan of the chest is PROABABLY UIP in my view. Other than the fact he does not have classic honeycombing is a classic UIP pattern in that it has bibasal pleural bilateral reticulation with basilar predominance with traction bronchiectasis. His autoimmune profile is essentially negative. The slightly high ANA is consistent  with age.   OV 06/09/2017  Chief Complaint  Patient presents with  . Follow-up    Follow-up after Esbriet start. States that he has been doing good. Denies any cough, SOB, or CP.   Follow-up idiopathic pulmonary fibrosis.  Esbriet prescribed given April 28, 2017  He was given samples of pirfenidone.  He took this.  He is now run out of it for the last 1 week.  He prefers to do insurance paperwork to start pirfenidone in January 2019 on account of his wife's illness.  His wife is age 65.  He tells me that he is tolerating pirfenidone just fine except for a new onset of small rash macular in his central chest area but this is not bothering him.  He is applying sunscreen regularly.  No nausea vomiting or diarrhea constipation.  He has a lot of questions about IPF its causes and natural history.   OV 08/14/2017  Chief Complaint  Patient presents with  . Follow-up    PFT done today.  Pt states he has been doing okay since his last visit. Pt still on Esbriet and has been doing fine on that.   Follow-up IPF.  He is now been on Esbriet for a few months.  Last liver function test end of 2018 was normal.  He tells me that he does have some nausea with this.  But it is controlled with cheese.  There is some increased fatigue and shortness of breath and recent times.  Otherwise doing okay.  He still continues to care for his 26 year old wife who is at home with hospice care.  He says wife vital signs are stable and she is not actively dying.  His goal is for him to stay as healthy as he can to take care of his wife.  His King's interstitial lung disease question is very similar to before but his spirometry shows a mild decline in a matter of few months.   Walking desaturation test on 08/14/2017 185 feet x 3 laps on ROOM AIR:  did NOT desaturate. Rest pulse ox was 100%, final pulse ox was 95%. HR response 59/min at rest to 75/min at peak exertion. Patient Ian Hale  Did not Desaturate < 88% . Ian A  Hale yes did  Desaturated </= 3% points. Ian Hale did not get tachyardic      OV 09/12/2017  Chief Complaint  Patient presents with  . Follow-up    PFT done today.  Pt states he believes his breathing is at a stable point.     Follow-up idiopathic pulmonary fibrosis.  IPF dx Sept/oct 2018.  Esbreit prescribed given April 28, 2017   Here with daughter; meeting first time. Tolerating esbriet well but for mild nausea for 90 min post intake. Annoyng but tolerable. Dyspnea stable. Not an issue . KBILD shows improvement. Main reason for 09/12/17 visit to see if PFT decline was a downtrend or not   OV 12/05/2017  Chief Complaint  Patient presents with  . Follow-up    PFT done today.  Pt states he has been doing good since last visit. Pt states SOB is about the same since last visit.     Follow-up idiopathic pulmonary fibrosis.  IPF dx Sept/oct 2019.  Esbreit prescribed given April 28, 2017    Follow-up IPF. Rowe A Wimberley presents for follow-up.  He is here with his daughter?  Lurena Joinerebecca.  He tells me overall from a respiratory standpoint he is stable.  Shortness of breath with exertion is stable.  Not much of a cough.  [place] go [place] but it was humid and had slight increase in respiratory symptoms.  He is tolerating his Pirfenidone (Esbriet) quite well except for the mild nausea.  He does not think he has worsened at all.  However pulmonary function test below shows a decline of 7.8% since January 2019 in terms of FVC and a steady decline in both FVC and DLCO since October 2018.  I did give the news to him.  He also tells me that the patient support group leader has not contacted them yet.  He is very interested in it.  He was supposed to have overnight oxygen study but this did not happen   OV 01/17/2018    Chief Complaint  Patient presents with  . Follow-up    [facility] and states he had been doing well on the OFEV. Pt states he had a  nosebleed 6/3 and also had a high BP of 196/80 and due to that, pt's OFEV was stopped. Pt began taking OFEV again 6/21.  Pt states he believes his condition has become worse.    Follow-up idiopathic pulmonary fibrosis.  IPF dx Sept/oct 2019.  Esbreit prescribed given April 28, 2017. Swtiched to ofev may 2019 due to progressive delcine in lung function. sarted ofev early June 2019 and interrrupted and again late June 2019    Dawsen A Bosques presence of interstitial lung disease clinic for follow-up of his idiopathic pulmonary fibrosis. Last visit 12/05/2017. History is gained from talking to him, his daughter and review and summarization of the old chart. At last visit I switched him from [facility]briet [facility] due to progressive worsening of pulmonary fibrosis. He started taking his Ofev in early June 2019.if on this medication vertigo very beneficial in terms of his symptoms started improving and shortness of breath. This is not supposed to be the case and he knows thatbut he did say that it did help her shortness of breath. However around this time his primaryprimary cardiologist stopped his antihypertensive according to his history and review of the chart confirms it. And then his blood pressure went up to 190 systolic and then he had epistaxis. He could not differentiate if the epistaxis was due to rising blood pressure or ofev which theoretically has a weak anticoagulant effect possibly similar to aspirin. Because of this I had him hold his ofev ust for a few days but he held it for longer and then started having worsening shortness of breath got worse since the last week he restarted ofev and shortness of breath is better. No further epistaxis.He showed his home blood pressure recordings and this continues to be high.walking desaturation test does indeed confirm that ascending to desaturate more easily although still adequate. He had his overnight oxygen study but I do not have these results with me.    OV  03/16/2018  Chief  Complaint  Patient presents with  . Follow-up    Pt states he is a little more SOB than usual and is a little more fatigued than usual.    Follow-up idiopathic pulmonary fibrosis.  IPF dx Sept/oct 2018.  Esbreit prescribed given April 28, 2017. Swtiched to ofev may 2019 due to progressive delcine in lung function. sarted ofev early June 2019 and interrrupted and again late June 2019   IPF follow-up. In this visit he reports and he is here with his daughter? Lurena Joiner that he is having progressive shortness of breath but no orthopnea or cough or edema that is worse. Fact no orthopnea or paroxysmal nocturnal dyspnea or weight loss or chest pain or fever or chills or sputum production or edema. He is compliant with this ofev and is not having any side effects. No bleeding issues. Walking desaturation test shows that he desaturated below 88% for the first time. Lung function shows a 15% decline in 3 months and a 30% decline in 10 months. He has now been on ofev for 2 months and he feels he is stable. Primary care physician is referred him to pulmonary rehabilitation. Wife is 66 years old   K-BILD ILD QUESTIONNAIRE, Symptom score over prior 2 weeks  7-none, 6-rarely, 5-occ, 5-some times, 3-sev times, 2-most times, 1-every time 04/12/2017  08/14/2017  09/12/2017   Dyspnea for stairs, incline or hill 5 6 6   Chest Tightness 5 5 6   Worry about seriousness of lung complaint 6 5 5   Avoided doing things that make you dyspneic 6 5 5   Have you felt loss of control of lung condition (reversed from original) 6 6 6   Felt fed up due to lung condition 7 5 6   Felt urge to breathe aka air hunger 7 6 6   Has lung condition made you feel anxious 6 5 6   How often have you experienced wheezing or whistling sound 6 5 6   How much of the time have you felt your lung dz is getting worse 6 5 5   How much has your lung condition interfered with job or daily task 6 5 5   Were you expecting your lung  condition to get worse 6 6 5   How much has your lung function limited you carrying things like groceris 7 4 6   How much has your lung function made you think of EOL? 6  4  Total     Are you financially worse off 7 6 6   Grand Total          Simple office walk 185 feet x  3 laps goal with forehead probe 12/05/2017  01/17/2018  03/16/2018   O2 used Room air Room air Room air  Number laps completed 3 3 3   Comments about pace Normal pace Normal pace Moderate pace  Resting Pulse Ox/HR 99% and 62/min 99% and 60/min 97% and 65/min  Final Pulse Ox/HR 91% and 76/min 89% and 68/in 86% and 77.min  Desaturated </= 88% no no yes  Desaturated <= 3% points yes ytes yes  Got Tachycardic >/= 90/min no no no  Symptoms at end of test Mild fatigue Moderate dyspnea Moderate dyspnea - worse  Miscellaneous comments none worse Corrected with 2LNC 95% at [facility]    Results for KEDARIUS, ALOISI (MRN [id number]) as of 03/16/2018 14:02    Ref. Range 04/28/2017 10:48 08/14/2017 10:59 09/12/2017 13:15 12/05/2017 11:41 03/16/2018 11:50  FVC-Pre Latest Units: L 3.25 2.96 2.91 2.73 2.29  FVC-%Pred-Pre Latest Units: % 88  80 79 74 64   Results for JAKOREY, MCCONATHY (MRN [id number]) as of 03/16/2018 14:02  Ref. Range 04/28/2017 10:48 08/14/2017 10:59 09/12/2017 13:15 12/05/2017 11:41 03/16/2018 11:50  DLCO unc Latest Units: ml/min/mmHg 15.10 13.45 13.40 14.40 12.54  DLCO unc % pred Latest Units: % 50 45 45 48 43    has a past medical history of Allergy, BPH (benign prostatic hyperplasia), CAD (coronary artery disease), Cholesteatoma of left ear, ED (erectile dysfunction), Glaucoma, Glaucoma, Hearing loss of both ears, Hyperlipidemia, and Hypertension.   reports that he has never smoked. He has never used smokeless tobacco.  Past Surgical History:  Procedure Laterality Date  . CLEFT PALATE REPAIR  at birth  . CORONARY ARTERY BYPASS GRAFT  01/2006    No Known Allergies  Immunization History  Administered Date(s) Administered   . Influenza Whole 07/31/2007  . Influenza, High Dose Seasonal PF 09/22/2016, 05/18/2017  . Influenza-Unspecified 06/24/2013  . Pneumococcal Conjugate-13 10/29/2013  . Pneumococcal Polysaccharide-23 07/25/2001, 07/31/2007  . Td 07/25/2001  . Tdap 10/04/2011  . Zoster 07/31/2008    Family History  Problem Relation Age of Onset  . Heart failure Mother   . Depression Father   . Heart failure Father   . Diabetes Father      Current Outpatient Medications:  .  aspirin 81 MG tablet, Take 81 mg by mouth daily.  , Disp: , Rfl:  .  atorvastatin (LIPITOR) 80 MG tablet, TAKE 1 TABLET(80 MG) BY MOUTH DAILY, Disp: 90 tablet, Rfl: 2 .  bimatoprost (LUMIGAN) 0.01 % SOLN, Place 1 drop into both eyes at bedtime., Disp: , Rfl:  .  cyanocobalamin 2000 MCG tablet, Take 2,000 mcg by mouth 2 (two) times daily., Disp: , Rfl:  .  furosemide (LASIX) 20 MG tablet, TAKE 1 TABLET(20 MG) BY MOUTH DAILY, Disp: 90 tablet, Rfl: 3 .  metoprolol tartrate (LOPRESSOR) 25 MG tablet, TAKE 1/2 TABLET(12.5 MG) BY MOUTH TWICE DAILY, Disp: 90 tablet, Rfl: 3 .  OFEV 150 MG CAPS, , Disp: , Rfl:  .  Omega-3 Fatty Acids (FISH OIL PO), Take 2 capsules by mouth. Pt takes 4000mg , Disp: , Rfl:  .  potassium chloride SA (K-DUR,KLOR-CON) 20 MEQ tablet, Take 0.5 tablets (10 mEq total) by mouth daily., Disp: 45 tablet, Rfl: 2 .  saw palmetto 160 MG capsule, Take 160 mg by mouth 2 (two) times daily.  , Disp: , Rfl:  .  nitroGLYCERIN (NITROSTAT) 0.4 MG SL tablet, Place 1 tablet (0.4 mg total) under the tongue every 5 (five) minutes as needed. (Patient not taking: Reported on 12/05/2017), Disp: 25 tablet, Rfl: 1    Review of Systems     Objective:   Physical Exam  Constitutional: He is oriented to person, place, and time. He appears well-developed and well-nourished. No distress.  HENT:  Head: Normocephalic and atraumatic.  Right Ear: External ear normal.  Left Ear: External ear normal.  Mouth/Throat: Oropharynx is clear and  moist. No oropharyngeal exudate.  Old cleft palate repair  Eyes: Pupils are equal, round, and reactive to light. Conjunctivae and EOM are normal. Right eye exhibits no discharge. Left eye exhibits no discharge. No scleral icterus.  Neck: Normal range of motion. Neck supple. No JVD present. No tracheal deviation present. No thyromegaly present.  Cardiovascular: Normal rate, regular rhythm and intact distal pulses. Exam reveals no gallop and no friction rub.  No murmur heard. Pulmonary/Chest: Effort normal. No respiratory distress. He has no wheezes. He has rales. He exhibits no tenderness.  Abdominal: Soft. Bowel sounds are normal. He exhibits no distension and no mass. There is no tenderness. There is no rebound and no guarding.  Musculoskeletal: Normal range of motion. He exhibits no edema or tenderness.  Lymphadenopathy:    He has no cervical adenopathy.  Neurological: He is alert and oriented to person, place, and time. He has normal reflexes. No cranial nerve deficit. Coordination normal.  Skin: Skin is warm and dry. No rash noted. He is not diaphoretic. No erythema. No pallor.  Psychiatric: He has a normal mood and affect. His behavior is normal. Judgment and thought content normal.  Nursing note and vitals reviewed.  Vitals:   03/16/18 1355  BP: 126/72  Pulse: 65  SpO2: 97%  Weight: 207 lb (93.9 kg)  Height: 5' 7.5" (1.715 m)  yEstimated body mass index is 31.94 kg/m as calculated from the following:   Height as of this encounter: 5' 7.5" (1.715 m).   Weight as of this encounter: 207 lb (93.9 kg).  There were no vitals filed for this visit.      Assessment:       ICD-10-CM   1. IPF (idiopathic pulmonary fibrosis) (HCC) J84.112 Ambulatory Referral for DME  2. Encounter for drug therapy Z79.899         Plan:      Ipf worse - 30% loss of FVC in 10 months with half of that decline coming in last 3 months This is despite ofev which I am glad you are tolerating well Your  disease is very aggressive I am very worried about this trajectory   Plan  - start o2 at day time 2L [place] with exertion; portable system - repeat ONO at night  - and if abnormal start night o2 - continue ofev 150mg  twice daily with food - check LFT 03/16/2018 - agree starting rehab as referred by Dr Felipa Eth - research is a care option for you to save your lung  - best study we can offer you is Galecto inhaler study; please take copy of consent - you need a HRCT scan but we will decide on timing next 6-8 weeks  Followup Thursday morning ILD clinic in 4-8 weeks     > 50% of this > 25 min visit spent in face to face counseling or coordination of care - by this undersigned MD - Dr Kalman Shan. This includes one or more of the following documented above: discussion of test results, diagnostic or treatment recommendations, prognosis, risks and benefits of management options, instructions, education, compliance or risk-factor reduction   Dr. Kalman Shan, M.D., [place].C.P Pulmonary and Critical Care Medicine Staff Physician, [facility] Center Director - Interstitial Lung Disease  Program  Pulmonary Fibrosis [facility] at [facility] [place], [place], [address]  Pager: [phone], If no answer or between  15:00h - 7:00h: call [phone] Telephone: [phone]

## 2018-03-16 NOTE — Addendum Note (Signed)
Addended by: Wyvonne LenzPINION, Darline Faith P on: 03/16/2018 02:45 PM   Modules accepted: Orders

## 2018-03-19 ENCOUNTER — Other Ambulatory Visit: Payer: Self-pay | Admitting: Cardiovascular Disease

## 2018-03-27 ENCOUNTER — Telehealth: Payer: Self-pay | Admitting: Internal Medicine

## 2018-03-27 DIAGNOSIS — J849 Interstitial pulmonary disease, unspecified: Secondary | ICD-10-CM

## 2018-03-27 NOTE — Telephone Encounter (Signed)
xxxx 

## 2018-03-27 NOTE — Telephone Encounter (Signed)
Ian Hale = needs HRCT due to worsening PFT and also is a year this month since prior CT. Pleas get a date and let me know when. There are special instructions for this standard of care prpotocol and I would like Ian Hale to be available on day of CT  Please let me know date and time of schedule so I can communicate with Misty Stanley at CT  IF can be done this week or next week is best  Thanks     SIGNATURE    Dr. Kalman Shan, M.D., [name],  Pulmonary and Critical Care Medicine Staff Physician, [facility] Center Director - Interstitial Lung Disease  Program  Pulmonary Fibrosis [facility] at [facility] [place], [place], [address]  Pager: [phone], If no answer or between  15:00h - 7:00h: call [phone] Telephone: [phone]  4:39 PM 03/27/2018

## 2018-03-27 NOTE — Telephone Encounter (Signed)
Called and spoke with pt letting him know that MR was going to have him get HRCT scan done due to worsening PFT.   Pt expressed understanding.  Once I see that the scan has been scheduled, will let MR know so he can let Misty Stanley know. Will keep encounter open.

## 2018-03-29 NOTE — Telephone Encounter (Signed)
Thanks. I am in touch with Ian Hale in CT

## 2018-03-29 NOTE — Telephone Encounter (Signed)
CT has been scheduled for 04/02/18 @ 3:30.  Routing to MR to make aware.

## 2018-04-02 ENCOUNTER — Ambulatory Visit (INDEPENDENT_AMBULATORY_CARE_PROVIDER_SITE_OTHER)
Admission: RE | Admit: 2018-04-02 | Discharge: 2018-04-02 | Disposition: A | Discharge status: Home / Self Care | Payer: Medicare Other | Source: Ambulatory Visit | Attending: Internal Medicine | Admitting: Internal Medicine

## 2018-04-02 DIAGNOSIS — J849 Interstitial pulmonary disease, unspecified: Secondary | ICD-10-CM | POA: Diagnosis not present

## 2018-04-04 ENCOUNTER — Encounter (HOSPITAL_COMMUNITY)
Admission: RE | Admit: 2018-04-04 | Discharge: 2018-04-04 | Disposition: A | Discharge status: Home / Self Care | Payer: Medicare Other | Source: Ambulatory Visit | Attending: Internal Medicine | Admitting: Internal Medicine

## 2018-04-04 VITALS — BP 141/64 | HR 55 | Temp 97.8°F | Resp 18 | Ht 67.5 in | Wt 207.7 lb

## 2018-04-04 DIAGNOSIS — E785 Hyperlipidemia, unspecified: Secondary | ICD-10-CM | POA: Diagnosis not present

## 2018-04-04 DIAGNOSIS — R942 Abnormal results of pulmonary function studies: Secondary | ICD-10-CM | POA: Insufficient documentation

## 2018-04-04 DIAGNOSIS — Z7982 Long term (current) use of aspirin: Secondary | ICD-10-CM | POA: Insufficient documentation

## 2018-04-04 DIAGNOSIS — N400 Enlarged prostate without lower urinary tract symptoms: Secondary | ICD-10-CM | POA: Diagnosis not present

## 2018-04-04 DIAGNOSIS — I2510 Atherosclerotic heart disease of native coronary artery without angina pectoris: Secondary | ICD-10-CM | POA: Insufficient documentation

## 2018-04-04 DIAGNOSIS — Z79899 Other long term (current) drug therapy: Secondary | ICD-10-CM | POA: Diagnosis not present

## 2018-04-04 DIAGNOSIS — R918 Other nonspecific abnormal finding of lung field: Secondary | ICD-10-CM | POA: Insufficient documentation

## 2018-04-04 DIAGNOSIS — J84112 Idiopathic pulmonary fibrosis: Secondary | ICD-10-CM | POA: Insufficient documentation

## 2018-04-04 DIAGNOSIS — I10 Essential (primary) hypertension: Secondary | ICD-10-CM | POA: Diagnosis not present

## 2018-04-04 NOTE — Progress Notes (Signed)
Ian Hale 83 y.o. male Pulmonary Rehab Orientation Note Ian Hale referred to pulmonary rehab by Dr. Marchelle Gearing for idiopathic pulmonary fibrosis, arrived today in Cardiac and Pulmonary Rehab for orientation to Pulmonary Rehab. He arrived ambulatory and tolerated the walk well with no distress . He does not carry portable oxygen. Per pt, he uses oxygen never although he has a home concentrator, Consulting civil engineer and is being evaluated on tomorrow for a POC. Pt feels he doesn't need it despite desaturation noted on office walk test.  Advised pt on the ramifications of not using oxygen when it is appropriate to do so.  Pt is interested in how his walk test will be on tomorrow.  Pt informed of our protocol of 88%. . Color good, skin warm and dry. Patient is oriented to time and place. Patient's medical history, psychosocial health, and medications reviewed. Psychosocial assessment reveals pt lives with their spouse. Pt is currently retired. 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 Pt reports his stress level is moderate. Areas of stress/anxiety include Family. Pt wife who is 96 resides in the home but requires round the clock care.  Pt provides most of that care although he has some respite care from time to time.  Pt wife is unable to be left alone due to high falls risk and has fallen several times. Pt does not exhibit  signs of depression. Pt has a realistic outlook on his disease process and is very aware there is no cure.  Pt participates in clinical trials as a means to give back and hopes it helps someone in the long run. Pt has not participated in the support group but has all the information and may attend later this month. PHQ2/9 score 0/2. Pt shows good  coping skills with positive outlook . Will continue to monitor and evaluate progress toward psychosocial goal(s) of continued mental well being a. Physical assessment reveals heart rate is bradycardic, breath sounds clear to  auscultation, no wheezes, rales, or rhonchi with characteristic "crackling" from pulmonary fibrosis in the lower lobes . Grip strength equal, strong. Distal pulses palpable with trace swelling noted in the ankles. Patient reports he does take medications as prescribed. Patient states he follows a Regular diet. Pt with no efforts to gain or lose weight.  Pt would like to lose about 15 pounds. Patient's weight will be monitored closely. Demonstration and practice of PLB using pulse oximeter. Patient able to return demonstration satisfactorily. Safety and hand hygiene in the exercise area reviewed with patient. Patient voices understanding of the information reviewed. Department expectations discussed with patient and achievable goals were set. The patient shows enthusiasm about attending the program and we look forward to working with this nice gentleman. The patient is scheduled for a 6 min walk test on 04/05/18 and to begin exercise on 9/19 at 1:30. 45 minutes was spent on a variety of activities such as assessment of the patient, obtaining baseline data including height, weight, BMI, and grip strength, verifying medical history, allergies, and current medications, and teaching patient strategies for performing tasks with less respiratory effort with emphasis on pursed lip breathing.[id number] Ian Hale, BSN Cardiac and Pulmonary Rehab Nurse Navigator

## 2018-04-05 ENCOUNTER — Encounter (HOSPITAL_COMMUNITY)
Admission: RE | Admit: 2018-04-05 | Discharge: 2018-04-05 | Disposition: A | Discharge status: Home / Self Care | Payer: Medicare Other | Source: Ambulatory Visit | Attending: Internal Medicine | Admitting: Internal Medicine

## 2018-04-05 DIAGNOSIS — J84112 Idiopathic pulmonary fibrosis: Secondary | ICD-10-CM

## 2018-04-05 NOTE — Progress Notes (Signed)
Pulmonary Individual Treatment Plan  Patient Details  Name: Ian Hale MRN: [id number] Date of Birth: 1934-08-31 Referring Provider:     Pulmonary Rehab Walk Test from 04/05/2018 in [facility] CARDIAC [facility]  Referring Provider  Dr. Vassie Loll      Initial Encounter Date:    Pulmonary Rehab Walk Test from 04/05/2018 in [facility] CARDIAC REHAB  Date  04/05/18      Visit Diagnosis: Idiopathic pulmonary fibrosis (HCC)  Patient's Home Medications on Admission:   Current Outpatient Medications:  .  aspirin 81 MG tablet, Take 81 mg by mouth daily.  , Disp: , Rfl:  .  atorvastatin (LIPITOR) 80 MG tablet, TAKE 1 TABLET(80 MG) BY MOUTH DAILY, Disp: 90 tablet, Rfl: 0 .  bimatoprost (LUMIGAN) 0.01 % SOLN, Place 1 drop into both eyes at bedtime., Disp: , Rfl:  .  cyanocobalamin 2000 MCG tablet, Take 2,000 mcg by mouth 2 (two) times daily., Disp: , Rfl:  .  furosemide (LASIX) 20 MG tablet, TAKE 1 TABLET(20 MG) BY MOUTH DAILY, Disp: 90 tablet, Rfl: 3 .  metoprolol tartrate (LOPRESSOR) 25 MG tablet, TAKE 1/2 TABLET(12.5 MG) BY MOUTH TWICE DAILY, Disp: 90 tablet, Rfl: 3 .  nitroGLYCERIN (NITROSTAT) 0.4 MG SL tablet, Place 1 tablet (0.4 mg total) under the tongue every 5 (five) minutes as needed., Disp: 25 tablet, Rfl: 1 .  OFEV 150 MG CAPS, , Disp: , Rfl:  .  Omega-3 Fatty Acids (FISH OIL PO), Take 2 capsules by mouth. Pt takes 4000mg , Disp: , Rfl:  .  potassium chloride SA (K-DUR,KLOR-CON) 20 MEQ tablet, Take 0.5 tablets (10 mEq total) by mouth daily., Disp: 45 tablet, Rfl: 2 .  saw palmetto 160 MG capsule, Take 160 mg by mouth daily. , Disp: , Rfl:   Past Medical History: Past Medical History:  Diagnosis Date  . Allergy   . BPH (benign prostatic hyperplasia)   . CAD (coronary artery disease)   . Cholesteatoma of left ear   . ED (erectile dysfunction)   . Glaucoma   . Glaucoma   . Hearing loss of both ears   . Hyperlipidemia   . Hypertension     Tobacco  Use: Social History   Tobacco Use  Smoking Status Never Smoker  Smokeless Tobacco Never Used    Labs: Recent Review Flowsheet Data    Labs for ITP Cardiac and Pulmonary Rehab Latest Ref Rng & Units 09/27/2011 10/09/2012 10/29/2013 02/23/2015 01/01/2016   Cholestrol 125 - 200 mg/dL [phone] 956(O)   LDLCALC <130 mg/dL 73 75 75 65 64   HDL >=13 mg/dL 08.65(H) 84.69(G) 29.52(W) 38.60(L) 34(L)   Trlycerides <150 mg/dL 413.2 44.0 10.2 725.3 664      Capillary Blood Glucose: No results found for: GLUCAP   Pulmonary Assessment Scores: Pulmonary Assessment Scores    Row Name 04/05/18 1628         ADL UCSD   ADL Phase  Entry       mMRC Score   mMRC Score  1        Pulmonary Function Assessment:   Exercise Target Goals: Exercise Program Goal: Individual exercise prescription set using results from initial 6 min walk test and THRR while considering  patient's activity barriers and safety.   Exercise Prescription Goal: Initial exercise prescription builds to 30-45 minutes a day of aerobic activity, 2-3 days per week.  Home exercise guidelines will be given to patient during program as part of exercise  prescription that the participant will acknowledge.  Activity Barriers & Risk Stratification:   6 Minute Walk: 6 Minute Walk    Row Name 04/05/18 1628         6 Minute Walk   Phase  Initial     Distance  900 feet     Walk Time  6 minutes     # of Rest Breaks  0     MPH  1.7     METS  2.3     RPE  12     Perceived Dyspnea   1     Resting HR  62 bpm     Resting BP  144/64     Resting Oxygen Saturation   92 %     Exercise Oxygen Saturation  during 6 min walk  84 %     Max Ex. HR  73 bpm     Max Ex. BP  140/62       Interval HR   1 Minute HR  71     2 Minute HR  92     3 Minute HR  73     4 Minute HR  73     5 Minute HR  71     6 Minute HR  71     Interval Heart Rate?  Yes       Interval Oxygen   Interval Oxygen?  Yes     Baseline Oxygen Saturation %  92 %      1 Minute Oxygen Saturation %  91 %     1 Minute Liters of Oxygen  0 L     2 Minute Oxygen Saturation %  90 %     2 Minute Liters of Oxygen  0 L     3 Minute Oxygen Saturation %  85 %     3 Minute Liters of Oxygen  0 L     4 Minute Oxygen Saturation %  86 %     4 Minute Liters of Oxygen  0 L     5 Minute Oxygen Saturation %  84 %     5 Minute Liters of Oxygen  0 L     6 Minute Oxygen Saturation %  84 %     6 Minute Liters of Oxygen  0 L        Oxygen Initial Assessment: Oxygen Initial Assessment - 04/05/18 1627      Initial 6 min Walk   Oxygen Used  None      Program Oxygen Prescription   Program Oxygen Prescription  E-Tanks;Continuous    Comments  Patient will need oxygen during Pulmonary Rehab. Patient desaturated to 84% during . Will conduct titration first day of exercise.        Oxygen Re-Evaluation:   Oxygen Discharge (Final Oxygen Re-Evaluation):   Initial Exercise Prescription: Initial Exercise Prescription - 04/05/18 1600      Date of Initial Exercise RX and Referring Provider   Date  04/05/18    Referring Provider  Dr. Vassie Loll      Oxygen   Oxygen  Continuous    Liters  --   will titrate on first day     Bike   Level  0.4    Minutes  17      NuStep   Level  2    SPM  80    Minutes  17    METs  1.5      Track  Laps  10    Minutes  17      Prescription Details   Frequency (times per week)  2    Duration  Progress to 45 minutes of aerobic exercise without signs/symptoms of physical distress      Intensity   THRR 40-80% of Max Heartrate  56-111    Ratings of Perceived Exertion  11-13    Perceived Dyspnea  0-4      Progression   Progression  Continue to progress workloads to maintain intensity without signs/symptoms of physical distress.      Resistance Training   Training Prescription  Yes    Weight  BLUE BANDS    Reps  10-15       Perform Capillary Blood Glucose checks as needed.  Exercise Prescription Changes:   Exercise  Comments:   Exercise Goals and Review: Exercise Goals    Row Name 04/04/18 1426             Exercise Goals   Increase Physical Activity  Yes       Intervention  Provide advice, education, support and counseling about physical activity/exercise needs.;Develop an individualized exercise prescription for aerobic and resistive training based on initial evaluation findings, risk stratification, comorbidities and participant's personal goals.       Expected Outcomes  Short Term: Attend rehab on a regular basis to increase amount of physical activity.;Long Term: Exercising regularly at least 3-5 days a week.;Long Term: Add in home exercise to make exercise part of routine and to increase amount of physical activity.       Increase Strength and Stamina  Yes       Intervention  Provide advice, education, support and counseling about physical activity/exercise needs.;Develop an individualized exercise prescription for aerobic and resistive training based on initial evaluation findings, risk stratification, comorbidities and participant's personal goals.       Expected Outcomes  Short Term: Increase workloads from initial exercise prescription for resistance, speed, and METs.;Short Term: Perform resistance training exercises routinely during rehab and add in resistance training at home;Long Term: Improve cardiorespiratory fitness, muscular endurance and strength as measured by increased METs and functional capacity ( )       Able to understand and use rate of perceived exertion (RPE) scale  Yes       Intervention  Provide education and explanation on how to use RPE scale       Expected Outcomes  Short Term: Able to use RPE daily in rehab to express subjective intensity level;Long Term:  Able to use RPE to guide intensity level when exercising independently       Able to understand and use Dyspnea scale  Yes       Intervention  Provide education and explanation on how to use Dyspnea scale        Expected Outcomes  Short Term: Able to use Dyspnea scale daily in rehab to express subjective sense of shortness of breath during exertion;Long Term: Able to use Dyspnea scale to guide intensity level when exercising independently       Knowledge and understanding of Target Heart Rate Range (THRR)  Yes       Intervention  Provide education and explanation of THRR including how the numbers were predicted and where they are located for reference       Expected Outcomes  Short Term: Able to state/look up THRR;Long Term: Able to use THRR to govern intensity when exercising independently;Short Term: Able to use daily as guideline for  intensity in rehab       Understanding of Exercise Prescription  Yes       Intervention  Provide education, explanation, and written materials on patient's individual exercise prescription       Expected Outcomes  Short Term: Able to explain program exercise prescription;Long Term: Able to explain home exercise prescription to exercise independently          Exercise Goals Re-Evaluation :   Discharge Exercise Prescription (Final Exercise Prescription Changes):   Nutrition:  Target Goals: Understanding of nutrition guidelines, daily intake of sodium 1500mg , cholesterol 200mg , calories 30% from fat and 7% or less from saturated fats, daily to have 5 or more servings of fruits and vegetables.  Biometrics:    Nutrition Therapy Plan and Nutrition Goals:   Nutrition Assessments:   Nutrition Goals Re-Evaluation:   Nutrition Goals Discharge (Final Nutrition Goals Re-Evaluation):   Psychosocial: Target Goals: Acknowledge presence or absence of significant depression and/or stress, maximize coping skills, provide positive support system. Participant is able to verbalize types and ability to use techniques and skills needed for reducing stress and depression.  Initial Review & Psychosocial Screening: Initial Psych Review & Screening - 04/04/18 1438       Initial Review   Current issues with  Current Stress Concerns    Source of Stress Concerns  Family    Comments  Cares for wife who is 26 year old.  Has respite care at times      [facility]   Good Support System?  Yes      Barriers   Psychosocial barriers to participate in program  There are no identifiable barriers or psychosocial needs.;The patient should benefit from training in stress management and relaxation.      Screening Interventions   Expected Outcomes  Long Term goal: The participant improves quality of Life and PHQ9 Scores as seen by post scores and/or verbalization of changes;Short Term goal: Identification and review with participant of any Quality of Life or Depression concerns found by scoring the questionnaire.       Quality of Life Scores:  Scores of 19 and below usually indicate a poorer quality of life in these areas.  A difference of  2-3 points is a clinically meaningful difference.  A difference of 2-3 points in the total score of the Quality of Life Index has been associated with significant improvement in overall quality of life, self-image, physical symptoms, and general health in studies assessing change in quality of life.  PHQ-9: Recent Review Flowsheet Data    Depression screen [facility] 2/9 04/04/2018 02/23/2015   Decreased Interest 0 0   Down, Depressed, Hopeless 0  0   PHQ - 2 Score 0 0   Altered sleeping 0  -   Tired, decreased energy 0 -   Change in appetite 0 -   Feeling bad or failure about yourself  0 -   Trouble concentrating 1 -   Moving slowly or fidgety/restless 1 -   Suicidal thoughts 0 -   PHQ-9 Score 2 -   Difficult doing work/chores Not difficult at all -     Interpretation of Total Score  Total Score Depression Severity:  1-4 = Minimal depression, 5-9 = Mild depression, 10-14 = Moderate depression, 15-19 = Moderately severe depression, 20-27 = Severe depression   Psychosocial Evaluation and Intervention: Psychosocial Evaluation -  04/04/18 1439      Psychosocial Evaluation & Interventions   Interventions  Stress management education  Expected Outcomes  Pt will seek community resources to assit with his wifes care.  Pt will display healthy and positive coping skills    Continue Psychosocial Services   Follow up required by staff       Psychosocial Re-Evaluation:   Psychosocial Discharge (Final Psychosocial Re-Evaluation):   Education: Education Goals: Education classes will be provided on a weekly basis, covering required topics. Participant will state understanding/return demonstration of topics presented.  Learning Barriers/Preferences: Learning Barriers/Preferences - 04/04/18 1441      Learning Barriers/Preferences   Learning Preferences  Computer/Internet;Group Instruction;Individual Instruction;Written Material       Education Topics: Risk Factor Reduction:  -Group instruction that is supported by a PowerPoint presentation. Instructor discusses the definition of a risk factor, different risk factors for pulmonary disease, and how the heart and lungs work together.     Nutrition for Pulmonary Patient:  -Group instruction provided by PowerPoint slides, verbal discussion, and written materials to support subject matter. The instructor gives an explanation and review of healthy diet recommendations, which includes a discussion on weight management, recommendations for fruit and vegetable consumption, as well as protein, fluid, caffeine, fiber, sodium, sugar, and alcohol. Tips for eating when patients are short of breath are discussed.   Pursed Lip Breathing:  -Group instruction that is supported by demonstration and informational handouts. Instructor discusses the benefits of pursed lip and diaphragmatic breathing and detailed demonstration on how to preform both.     Oxygen Safety:  -Group instruction provided by PowerPoint, verbal discussion, and written material to support subject matter. There is  an overview of "What is Oxygen" and "Why do we need it".  Instructor also reviews how to create a safe environment for oxygen use, the importance of using oxygen as prescribed, and the risks of noncompliance. There is a brief discussion on traveling with oxygen and resources the patient may utilize.   Oxygen Equipment:  -Group instruction provided by [facility] utilizing handouts, written materials, and equipment demonstrations.   Signs and Symptoms:  -Group instruction provided by written material and verbal discussion to support subject matter. Warning signs and symptoms of infection, stroke, and heart attack are reviewed and when to call the physician/911 reinforced. Tips for preventing the spread of infection discussed.   Advanced Directives:  -Group instruction provided by verbal instruction and written material to support subject matter. Instructor reviews Advanced Directive laws and proper instruction for filling out document.   Pulmonary Video:  -Group video education that reviews the importance of medication and oxygen compliance, exercise, good nutrition, pulmonary hygiene, and pursed lip and diaphragmatic breathing for the pulmonary patient.   Exercise for the Pulmonary Patient:  -Group instruction that is supported by a PowerPoint presentation. Instructor discusses benefits of exercise, core components of exercise, frequency, duration, and intensity of an exercise routine, importance of utilizing pulse oximetry during exercise, safety while exercising, and options of places to exercise outside of rehab.     Pulmonary Medications:  -Verbally interactive group education provided by instructor with focus on inhaled medications and proper administration.   Anatomy and Physiology of the Respiratory System and Intimacy:  -Group instruction provided by PowerPoint, verbal discussion, and written material to support subject matter. Instructor reviews respiratory cycle and  anatomical components of the respiratory system and their functions. Instructor also reviews differences in obstructive and restrictive respiratory diseases with examples of each. Intimacy, Sex, and Sexuality differences are reviewed with a discussion on how relationships can change when diagnosed with pulmonary disease.  Common sexual concerns are reviewed.   MD DAY -A group question and answer session with a medical doctor that allows participants to ask questions that relate to their pulmonary disease state.   OTHER EDUCATION -Group or individual verbal, written, or video instructions that support the educational goals of the pulmonary rehab program.   Holiday Eating Survival Tips:  -Group instruction provided by PowerPoint slides, verbal discussion, and written materials to support subject matter. The instructor gives patients tips, tricks, and techniques to help them not only survive but enjoy the holidays despite the onslaught of food that accompanies the holidays.   Knowledge Questionnaire Score:   Core Components/Risk Factors/Patient Goals at Admission: Personal Goals and Risk Factors at Admission - 04/04/18 1434      Core Components/Risk Factors/Patient Goals on Admission    Weight Management  Yes;Weight Loss    Intervention  Obesity: Provide education and appropriate resources to help participant work on and attain dietary goals.;Weight Management/Obesity: Establish reasonable short term and long term weight goals.;Weight Management: Provide education and appropriate resources to help participant work on and attain dietary goals.;Weight Management: Develop a combined nutrition and exercise program designed to reach desired caloric intake, while maintaining appropriate intake of nutrient and fiber, sodium and fats, and appropriate energy expenditure required for the weight goal.    Admit Weight  207 lb 10.8 oz (94.2 kg)    Goal Weight: Short Term  200 lb (90.7 kg)    Goal Weight:  Long Term  190 lb (86.2 kg)    Expected Outcomes  Short Term: Continue to assess and modify interventions until short term weight is achieved;Weight Loss: Understanding of general recommendations for a balanced deficit meal plan, which promotes 1-2 lb weight loss per week and includes a negative energy balance of [phone] kcal/d;Understanding recommendations for meals to include 15-35% energy as protein, 25-35% energy from fat, 35-60% energy from carbohydrates, less than 200mg  of dietary cholesterol, 20-35 gm of total fiber daily;Understanding of distribution of calorie intake throughout the day with the consumption of 4-5 meals/snacks    Improve shortness of breath with ADL's  Yes    Intervention  Provide education, individualized exercise plan and daily activity instruction to help decrease symptoms of SOB with activities of daily living.    Expected Outcomes  Short Term: Improve cardiorespiratory fitness to achieve a reduction of symptoms when performing ADLs;Long Term: Be able to perform more ADLs without symptoms or delay the onset of symptoms    Lipids  Yes    Intervention  Provide education and support for participant on nutrition & aerobic/resistive exercise along with prescribed medications to achieve LDL 70mg , HDL >40mg .    Expected Outcomes  Short Term: Participant states understanding of desired cholesterol values and is compliant with medications prescribed. Participant is following exercise prescription and nutrition guidelines.;Long Term: Cholesterol controlled with medications as prescribed, with individualized exercise RX and with personalized nutrition plan. Value goals: LDL < 70mg , HDL > 40 mg.    Stress  Yes    Intervention  Offer individual and/or small group education and counseling on adjustment to heart disease, stress management and health-related lifestyle change. Teach and support self-help strategies.    Expected Outcomes  Short Term: Participant demonstrates changes in  health-related behavior, relaxation and other stress management skills, ability to obtain effective social support, and compliance with psychotropic medications if prescribed.;Long Term: Emotional wellbeing is indicated by absence of clinically significant psychosocial distress or social isolation.   cares for 50 year old  wife      Core Components/Risk Factors/Patient Goals Review:    Core Components/Risk Factors/Patient Goals at Discharge (Final Review):    ITP Comments: ITP Comments    Row Name 04/04/18 1401           ITP Comments  Dr, Charlestine Massed, Medical Director Pulmonary Rehab          Comments:

## 2018-04-06 NOTE — Progress Notes (Signed)
Ian AlmRobert A Hale 83 y.o. male  DOB: 11/26/1934 MRN: [id number]           Nutrition Note 1. Idiopathic pulmonary fibrosis (HCC)    Past Medical History:  Diagnosis Date  . Allergy   . BPH (benign prostatic hyperplasia)   . CAD (coronary artery disease)   . Cholesteatoma of left ear   . ED (erectile dysfunction)   . Glaucoma   . Glaucoma   . Hearing loss of both ears   . Hyperlipidemia   . Hypertension    Meds reviewed.    Current Outpatient Medications (Cardiovascular):  .  atorvastatin (LIPITOR) 80 MG tablet, TAKE 1 TABLET(80 MG) BY MOUTH DAILY .  furosemide (LASIX) 20 MG tablet, TAKE 1 TABLET(20 MG) BY MOUTH DAILY .  metoprolol tartrate (LOPRESSOR) 25 MG tablet, TAKE 1/2 TABLET(12.5 MG) BY MOUTH TWICE DAILY .  nitroGLYCERIN (NITROSTAT) 0.4 MG SL tablet, Place 1 tablet (0.4 mg total) under the tongue every 5 (five) minutes as needed.  Current Outpatient Medications (Respiratory):  Marland Kitchen.  OFEV 150 MG CAPS,   Current Outpatient Medications (Analgesics):  .  aspirin 81 MG tablet, Take 81 mg by mouth daily.    Current Outpatient Medications (Hematological):  .  cyanocobalamin 2000 MCG tablet, Take 2,000 mcg by mouth 2 (two) times daily.  Current Outpatient Medications (Other):  .  bimatoprost (LUMIGAN) 0.01 % SOLN, Place 1 drop into both eyes at bedtime. .  Omega-3 Fatty Acids (FISH OIL PO), Take 2 capsules by mouth. Pt takes 4000mg  .  potassium chloride SA (K-DUR,KLOR-CON) 20 MEQ tablet, Take 0.5 tablets (10 mEq total) by mouth daily. .  saw palmetto 160 MG capsule, Take 160 mg by mouth daily.    Ht: Ht Readings from Last 1 Encounters:  04/04/18 5' 7.5" (1.715 m)     Wt:  Wt Readings from Last 3 Encounters:  04/04/18 207 lb 10.8 oz (94.2 kg)  03/16/18 207 lb (93.9 kg)  01/17/18 205 lb (93 kg)     BMI: Body mass index is 32.05 kg/m.    Current tobacco use? No    Labs:  Lipid Panel     Component Value Date/Time   CHOL 120 (L) 01/01/2016 0943   TRIG 108 01/01/2016  0943   HDL 34 (L) 01/01/2016 0943   CHOLHDL 3.5 01/01/2016 0943   VLDL 22 01/01/2016 0943   LDLCALC 64 01/01/2016 0943    No results found for: HGBA1C  Nutrition Diagnosis  ? Overweight/obesity related to excessive energy intake as evidenced by a BMI of Body mass index is 32.05 kg/m.   Goal(s)  1. Pt to identify and limit food sources of sodium, saturated fat, trans fat, refined carbohydrates.  2. Identify food quantities necessary to achieve wt loss of  -2# per week to a goal wt loss of 2.7-10.9 kg (6-24 lb) at graduation from pulmonary rehab. 3. Describe the benefit of including fruits, vegetables, whole grains, and low-fat dairy products in a healthy meal plan.  Plan:  Pt to attend Pulmonary Nutrition class Will provide client-centered nutrition education as part of interdisciplinary care.    Monitor and Evaluate progress toward nutrition goal with team.   Ross MarcusAubrey Burklin, MS, RD, LDN 04/06/2018 7:35 AM

## 2018-04-10 ENCOUNTER — Other Ambulatory Visit: Payer: Self-pay | Admitting: Internal Medicine

## 2018-04-10 DIAGNOSIS — J849 Interstitial pulmonary disease, unspecified: Secondary | ICD-10-CM

## 2018-04-11 ENCOUNTER — Ambulatory Visit: Payer: Medicare Other | Admitting: Internal Medicine

## 2018-04-11 ENCOUNTER — Encounter: Payer: Self-pay | Admitting: Internal Medicine

## 2018-04-11 ENCOUNTER — Encounter (HOSPITAL_COMMUNITY): Payer: Self-pay | Admitting: *Deleted

## 2018-04-11 ENCOUNTER — Encounter: Payer: Medicare Other | Admitting: *Deleted

## 2018-04-11 ENCOUNTER — Encounter (INDEPENDENT_AMBULATORY_CARE_PROVIDER_SITE_OTHER): Payer: Medicare Other | Admitting: Internal Medicine

## 2018-04-11 DIAGNOSIS — Z006 Encounter for examination for normal comparison and control in clinical research program: Secondary | ICD-10-CM

## 2018-04-11 DIAGNOSIS — J84112 Idiopathic pulmonary fibrosis: Secondary | ICD-10-CM

## 2018-04-11 DIAGNOSIS — J849 Interstitial pulmonary disease, unspecified: Secondary | ICD-10-CM

## 2018-04-11 LAB — PULMONARY FUNCTION TEST
DL/VA % pred: 71 %
DL/VA: 3.17 ml/min/mmHg/L
DLCO COR % PRED: 35 %
DLCO COR: 10.21 ml/min/mmHg
DLCO UNC % PRED: 37 %
DLCO unc: 10.82 ml/min/mmHg

## 2018-04-11 NOTE — Research (Signed)
Title: GALACTIC-1 [facility] Study) is a Phase 2b, randomized, double-blind, parallel, placebo-controlled multicenter international study to evaluate evaluate the efficacy and safety of two doses (placebo v 3mg  v 10mg ) of TD139 administered once a day for 52 weeks as compared to placebo in subjects with Idiopathic Pulmonary Fibrosis. Key Primary End Point: Annual rate of decline in FVC expressed in mL over 52 weeks  Protocol #: GALACTIC-1, Clinical Trials #: [id number]. Sponsor: [email or web address]([place], [place])   AstronomerClinical Research Coordinator / Radio produceresearch RN note : This visit for Subject Ian Hale with DOB: 08/15/1934 on 04/11/2018 for the above protocol is Visit/Encounter # Screening Visit 1  and is for purpose of research . (Subject/LAR) expressed continued interest and consent in continuing as a study subject. Subject confirmed that there was  no change in contact information (e.g. address, telephone, email). Subject thanked for participation in research and contribution to science.   In this visit 04/11/2018 the subject will be evaluated by investigator named Dr. Marchelle Gearingamaswamy. This research coordinator has verified that the investigator is uptodate with his/her training logs. Because this visit is a key visit of screening  this visit is under direct supervision of the PI Dr. Marchelle Gearingamaswamy .    Informed Consent   Subject Name: Ian Hale  This patient, Ian Hale, has been consented to the above clinical trial according to FDA regulations, GCP guidelines and [facility]'s SOPs. The informed consent form and study design have been explained to this patient by this study coordinator on 04/11/2018. The patient demonstrated comprehension of this clinical trial and study requirements/expectations. No study procedures have been initiated before consenting of this patient. The patient was given sufficient time for reading the consent form. All risks, benefits and options have been thoroughly discussed and  all questions were answered per the patient's satisfaction. This patient was not coerced in any way to participate in this clinical trial. This patient has voluntarily signed consent version 2.0 at 10:00am on 04/11/2018. A copy of the signed consent form was given to the patient and a copy was placed in the subject's medical record. Subject was thanked for their participation in research and contribution to science.  Refer to the subjects paper source binder for further documentation of the consent process.   After consent, all procedures completed per the above mentioned protocol. Refer to the subjects paper source binder for further documentation of assessments and procedures completed.    Signed by Carron CurieJennifer Roseann Kees, CMA, [facility] Clinical Research Coordinator  [facility]  [place], [place] 2:18 PM 04/11/2018

## 2018-04-11 NOTE — Progress Notes (Signed)
OV 04/11/2018  Subjective:  Patient ID: Ian Hale, male , DOB: Aug 13, 1934 , age 83 y.o. , MRN: [id number] , ADDRESS: [address] [place] [address]   Title: [facility] Study) is a Phase 2b, randomized, double-blind, parallel, placebo-controlled multicenter international study to evaluate evaluate the efficacy and safety of two doses (placebo v 3mg  v 10mg ) of TD139 administered once a day for 52 weeks as compared to placebo in subjects with Idiopathic Pulmonary Fibrosis. Key Primary End Point: Annual rate of decline in FVC expressed in mL over 52 weeks  Protocol #: GALACTIC-1, Clinical Trials #: [id number]. Sponsor: [email or web address], [place]) Protocol Version 3.0, dated 10Dec2018  IB Version 8.0 dated 07Dec2018  [place] Main ICF Version Number: 5.1.0; Date: 30-Aug-2017 Local Site Version 1.0 dated 29April2019   ---- New Announcement-----  Central IRB approved Protocol Amendment version 4.8 for the Galactic-1 study. - > Please note that the processing of this CGIRB document is in progress... Subject to be re-consented once IRB approved etc.  ................................................................................Marland Kitchen  Key Features of TD139 the study drug: a Galectin-3 inhibitor designed specifically to modulate the fibrogenic response to tissue injury. In multiple models of organ fibrosis, it has been demonstrated that Gal-3 is potently pro-fibrotic, modulating the activity of fibroblasts and macrophages in chronically injured tissues. It is hypothesized that increased Gal-3 expression during chronic injury leads to fibroblast activation via the aggregation of growth factor receptor clusters (such as TGF-), thereby amplifying profibrotic signal cascades.  Key Inclusion Criteria:  Age ? 52 y Diagnosis of IPF established during the previous 3 years An historical diagnostic HRCT scan within the 12 months prior to screening. Note: a separate HRCT scan will  not be performed as part of the study. Any existing SoC treatment must be deemed as stable by the PI/treating physician before randomization into the study. a. FVC > 45% of the predicted  b. DLCO (corrected for Hb) of 30% to 79% of the predicted value at screening c. FEV1/FVC ?  0.7  Women of childbearing potential agree to use highly effective birth control methods during the study  Key Exclusion Criteria  Has a history of malignancy within the last 2 years with the exception of basal cell carcinoma, chronic lymphocytic leukaemia and prostate cancer requiring androgens, localised treatment and/or managed by observation. Is likely to receive lung transplantation within the next 12 months Currently receiving investigational therapy for IPF or administration of such therapeutics within 4 weeks of initial screening (or 5 half-lives, whichever is longer) Currently receiving high dose corticosteroids, cytotoxic, and or vasodilator therapy for pulmonary hypertension or administration of such therapeutics within 4 weeks of initial screening (or 5 half-lives, whichever is longer). A current dose of less than or equal to 15 mg/day of prednisone or its equivalent is acceptable if the dose is anticipated to remain stable during the study. Short term use (<1 month) of higher dose corticosteroid treatment is also permitted. Has a history of unstable or deteriorating cardiac or pulmonary disease (other than IPF) within the previous six months Has clinical evidence of active infection   Half-life: 3.5-8 hours For the 3 mg dose, the t1/2 is 3.5 hours. For 10 mg dose, the t1/2 is closer to 8 hours  Interactions No clinically significant CYP interactions  Safety Data Version 3.0. 03 Jul 2017  Animal studies: - No effect on central nervous system - No effect on blood pressure, heart rate or mean arterial pressure - No effect on QTc - No  effect on respiratory system - No mention of renal/hepatic  effect  Phase I studies: Conducted in 36 healthy patients, 41% reported treatment emergent adverse events (TEAE); all were mild in nature. The most frequently reported TEAE was dysgeusia. This occurred in 36% of patients and was transient. 8% of patients reported cough. 2 patients (5%) were diagnosed with an upper respiratory tract infection during the study, and  required treatment with antibiotics. 1 patient (2%) experienced a serious adverse event; pneumonia, this was fatal. Little systemic effect seen thus far.    04/11/2018 -    [name] 83 y.o. -has IPF and he is here for consenting and screenign visit for the above study.  ICF first obtained before any study procedures .Marland Kitchen. We discussed above study details + key concept of research below along with others in the checklist that are in  Paper source doc. We disucssed the inhaler based IP in this study. 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 Smalller straws work better for him. We reviewed his standard ofcare PFTs and they esp in Aug 2019 was of good technique. So we do not anticipate problems with inhaler technique with this study. HE is on stable dose of ofev and tolerating it well. Baseline stoolks have gotten softer with ofev but is not diarrhea. No side effects from ofev.    1. Scientific Purpose  Clinical research is designed to produce generalizable knowledge and to answer questions about the safety and efficacy of intervention(s) under study in order to determine whether or not they may be useful for the care of future patients.  2. Study Procedures  Participation in a trial may involve procedures or tests, in addition to the intervention(s) under study, that are intended only or primarily to generate scientific knowledge and that are otherwise not necessary for patient care.   3. Uncertainty  For intervention(s)  under study in clinical research, there often is less knowledge and more uncertainty about the risks and benefits to a population of trial participants than there is when a doctor offers a patient standard interventions.   4. Adherence to Protocol  Administration of the intervention(s) under study is typically based on a strict protocol with defined dose, scheduling, and use or avoidance of concurrent medications, compared to administration of standard interventions.  5. Clinician as Investigator  Clinicians who are in health care settings provide treatment; in a clinical trial setting, they are also investigating safety and efficacy of an intervention. In otherwise your doctor or nurse practitioner can be wearing 2 hats - one as care giver another as Oceanographerresearch investigator  6. Patient as Engineer, agriculturalVolunteer or Research Subject  Patients participating in research trials are research subjects or volunteers. In other words participating in research is 100% voluntary and at one's own free weill. The decision to participate or not participate will NOT affect patient care and the doctor-patient relationship in any way  7. Financial Conflict of Interest Disclosure  One or more of the investigators of  the research trial might an investment interest in [facility] the clinical trials site and is both the company and the investigators are being compensated for their effort in trial research activities.    ............Marland Kitchen.     ROS - per HPI     has a past medical history of Allergy, BPH (benign prostatic hyperplasia), CAD (coronary  artery disease), Cholesteatoma of left ear, ED (erectile dysfunction), Glaucoma, Glaucoma, Hearing loss of both ears, Hyperlipidemia, and Hypertension.   reports that he has never smoked. He has never used smokeless tobacco.  Past Surgical History:  Procedure Laterality Date  . CLEFT PALATE REPAIR  at birth  . CORONARY ARTERY BYPASS GRAFT  01/2006    No Known  Allergies  Immunization History  Administered Date(s) Administered  . Influenza Whole 07/31/2007  . Influenza, High Dose Seasonal PF 09/22/2016, 05/18/2017  . Influenza-Unspecified 06/24/2013  . Pneumococcal Conjugate-13 10/29/2013  . Pneumococcal Polysaccharide-23 07/25/2001, 07/31/2007  . Td 07/25/2001  . Tdap 10/04/2011  . Zoster 07/31/2008    Family History  Problem Relation Age of Onset  . Heart failure Mother   . Depression Father   . Heart failure Father   . Diabetes Father      Current Outpatient Medications:  .  aspirin 81 MG tablet, Take 81 mg by mouth daily.  , Disp: , Rfl:  .  atorvastatin (LIPITOR) 80 MG tablet, TAKE 1 TABLET(80 MG) BY MOUTH DAILY, Disp: 90 tablet, Rfl: 0 .  bimatoprost (LUMIGAN) 0.01 % SOLN, Place 1 drop into both eyes at bedtime., Disp: , Rfl:  .  cyanocobalamin 2000 MCG tablet, Take 2,000 mcg by mouth 2 (two) times daily., Disp: , Rfl:  .  furosemide (LASIX) 20 MG tablet, TAKE 1 TABLET(20 MG) BY MOUTH DAILY, Disp: 90 tablet, Rfl: 3 .  metoprolol tartrate (LOPRESSOR) 25 MG tablet, TAKE 1/2 TABLET(12.5 MG) BY MOUTH TWICE DAILY, Disp: 90 tablet, Rfl: 3 .  nitroGLYCERIN (NITROSTAT) 0.4 MG SL tablet, Place 1 tablet (0.4 mg total) under the tongue every 5 (five) minutes as needed., Disp: 25 tablet, Rfl: 1 .  OFEV 150 MG CAPS, , Disp: , Rfl:  .  Omega-3 Fatty Acids (FISH OIL PO), Take 2 capsules by mouth. Pt takes 4000mg , Disp: , Rfl:  .  potassium chloride SA (K-DUR,KLOR-CON) 20 MEQ tablet, Take 0.5 tablets (10 mEq total) by mouth daily., Disp: 45 tablet, Rfl: 2 .  saw palmetto 160 MG capsule, Take 160 mg by mouth daily. , Disp: , Rfl:       Objective:   Physical Exam Done after ICF - placd in paper source  EKG and spiro - done revieed Labs done         Assessment:       ICD-10-CM   1. Research subject Z00.6   2. IPF (idiopathic pulmonary fibrosis) (HCC) Z61.096        Plan:     Patient Instructions     ICD-10-CM   1. Research  subject Z00.6   2. IPF (idiopathic pulmonary fibrosis) (HCC) E45.409     Per prptocol     SIGNATURE    Dr. Kalman Shan, M.D., [name], [name] Pulmonary and Critical Care Medicine Research Investigator, [facility] @ [facility] Staff Physician, [facility] Center Director - Interstitial Lung Disease  Program  Pulmonary Fibrosis [facility] - [place] [facility] and [facility] @ [facility] [place], [place], [address]  Pager: [phone], If no answer or between  15:00h - 7:00h: call [phone] Telephone: [phone]  4:54 PM 04/11/2018

## 2018-04-11 NOTE — Patient Instructions (Signed)
ICD-10-CM   1. Research subject Z00.6   2. IPF (idiopathic pulmonary fibrosis) (HCC) J84.112     Per prptocol 

## 2018-04-12 ENCOUNTER — Encounter (HOSPITAL_COMMUNITY)
Admission: RE | Admit: 2018-04-12 | Discharge: 2018-04-12 | Disposition: A | Discharge status: Home / Self Care | Payer: Medicare Other | Source: Ambulatory Visit | Attending: Internal Medicine | Admitting: Internal Medicine

## 2018-04-12 DIAGNOSIS — J84112 Idiopathic pulmonary fibrosis: Secondary | ICD-10-CM

## 2018-04-12 NOTE — Progress Notes (Signed)
Daily Session Note  Patient Details  Name: Ian Hale MRN: [id number] Date of Birth: 06-Mar-1935 Referring Provider:     Pulmonary Rehab Walk Test from 04/05/2018 in [place]  Referring Provider  Dr. Elsworth Soho      Encounter Date: 04/12/2018  Check In: Session Check In - 04/12/18 1550      Check-In   Supervising physician immediately available to respond to emergencies  [facility] immediately available    Physician(s)  Dr. Toma Copier    Staff Present  Maurice Small, RN, BSN;[place] Kirchner, MS, ACSM RCEP, Exercise Physiologist;Lisa Colletta [place], RN, MHA    Medication changes reported      No    Fall or balance concerns reported     No    Tobacco Cessation  No Change    Warm-up and Cool-down  Performed as group-led instruction    Resistance Training Performed  Yes    VAD Patient?  No    PAD/SET Patient?  No      Pain Assessment   Currently in Pain?  No/denies    Pain Score  0-No pain    Multiple Pain Sites  No       Capillary Blood Glucose: No results found for this or any previous visit (from the past 24 hour(s)).    Social History   Tobacco Use  Smoking Status Never Smoker  Smokeless Tobacco Never Used    Goals Met:  Personal goals reviewed  Goals Unmet:  Not Applicable  Comments: Service time is from 1:30p to 3:25p    Dr. Rush Farmer is Medical Director for Pulmonary Rehab at [facility].

## 2018-04-12 NOTE — Progress Notes (Signed)
Pulmonary Individual Treatment Plan  Patient Details  Name: Ian Hale MRN: [id number] Date of Birth: 05-01-1935 Referring Provider:     Pulmonary Rehab Walk Test from 04/05/2018 in [facility]  Referring Provider  Dr. Elsworth Soho      Initial Encounter Date:    Pulmonary Rehab Walk Test from 04/05/2018 in [facility]  Date  04/05/18      Visit Diagnosis: Idiopathic pulmonary fibrosis ([facility])  Patient's Home Medications on Admission:   Current Outpatient Medications:  .  aspirin 81 MG tablet, Take 81 mg by mouth daily.  , Disp: , Rfl:  .  atorvastatin (LIPITOR) 80 MG tablet, TAKE 1 TABLET(80 MG) BY MOUTH DAILY, Disp: 90 tablet, Rfl: 0 .  bimatoprost (LUMIGAN) 0.01 % SOLN, Place 1 drop into both eyes at bedtime., Disp: , Rfl:  .  cyanocobalamin 2000 MCG tablet, Take 2,000 mcg by mouth 2 (two) times daily., Disp: , Rfl:  .  furosemide (LASIX) 20 MG tablet, TAKE 1 TABLET(20 MG) BY MOUTH DAILY, Disp: 90 tablet, Rfl: 3 .  metoprolol tartrate (LOPRESSOR) 25 MG tablet, TAKE 1/2 TABLET(12.5 MG) BY MOUTH TWICE DAILY, Disp: 90 tablet, Rfl: 3 .  nitroGLYCERIN (NITROSTAT) 0.4 MG SL tablet, Place 1 tablet (0.4 mg total) under the tongue every 5 (five) minutes as needed., Disp: 25 tablet, Rfl: 1 .  OFEV 150 MG CAPS, , Disp: , Rfl:  .  Omega-3 Fatty Acids (FISH OIL PO), Take 2 capsules by mouth. Pt takes 4011m, Disp: , Rfl:  .  potassium chloride SA (K-DUR,KLOR-CON) 20 MEQ tablet, Take 0.5 tablets (10 mEq total) by mouth daily., Disp: 45 tablet, Rfl: 2 .  saw palmetto 160 MG capsule, Take 160 mg by mouth daily. , Disp: , Rfl:   Past Medical History: Past Medical History:  Diagnosis Date  . Allergy   . BPH (benign prostatic hyperplasia)   . CAD (coronary artery disease)   . Cholesteatoma of left ear   . ED (erectile dysfunction)   . Glaucoma   . Glaucoma   . Hearing loss of both ears   . Hyperlipidemia   . Hypertension     Tobacco  Use: Social History   Tobacco Use  Smoking Status Never Smoker  Smokeless Tobacco Never Used    Labs: Recent Review Flowsheet Data    Labs for ITP Cardiac and Pulmonary Rehab Latest Ref Rng & Units 09/27/2011 10/09/2012 10/29/2013 02/23/2015 01/01/2016   Cholestrol 125 - 200 mg/dL 126 120 123 127 120(L)   LDLCALC <130 mg/dL 73 75 75 65 64   HDL >=40 mg/dL 33.10(L) 29.00(L) 32.60(L) 38.60(L) 34(L)   Trlycerides <150 mg/dL 102.0 80.0 77.0 116.0 108      Capillary Blood Glucose: No results found for: GLUCAP   Pulmonary Assessment Scores: Pulmonary Assessment Scores    Row Name 04/05/18 1628 04/11/18 1520       ADL UCSD   ADL Phase  Entry  Entry    SOB Score total  -  68      CAT Score   CAT Score  -  20      mMRC Score   mMRC Score  1  -       Pulmonary Function Assessment:   Exercise Target Goals: Exercise Program Goal: Individual exercise prescription set using results from initial 6 min walk test and THRR while considering  patient's activity barriers and safety.   Exercise Prescription Goal: Initial exercise prescription builds  to 30-45 minutes a day of aerobic activity, 2-3 days per week.  Home exercise guidelines will be given to patient during program as part of exercise prescription that the participant will acknowledge.  Activity Barriers & Risk Stratification:   6 Minute Walk: 6 Minute Walk    Row Name 04/05/18 1628         6 Minute Walk   Phase  Initial     Distance  900 feet     Walk Time  6 minutes     # of Rest Breaks  0     MPH  1.7     METS  2.3     RPE  12     Perceived Dyspnea   1     Resting HR  62 bpm     Resting BP  144/64     Resting Oxygen Saturation   92 %     Exercise Oxygen Saturation  during 6 min walk  84 %     Max Ex. HR  73 bpm     Max Ex. BP  140/62       Interval HR   1 Minute HR  71     2 Minute HR  92     3 Minute HR  73     4 Minute HR  73     5 Minute HR  71     6 Minute HR  71     Interval Heart Rate?  Yes         Interval Oxygen   Interval Oxygen?  Yes     Baseline Oxygen Saturation %  92 %     1 Minute Oxygen Saturation %  91 %     1 Minute Liters of Oxygen  0 L     2 Minute Oxygen Saturation %  90 %     2 Minute Liters of Oxygen  0 L     3 Minute Oxygen Saturation %  85 %     3 Minute Liters of Oxygen  0 L     4 Minute Oxygen Saturation %  86 %     4 Minute Liters of Oxygen  0 L     5 Minute Oxygen Saturation %  84 %     5 Minute Liters of Oxygen  0 L     6 Minute Oxygen Saturation %  84 %     6 Minute Liters of Oxygen  0 L        Oxygen Initial Assessment: Oxygen Initial Assessment - 04/05/18 1627      Initial 6 min Walk   Oxygen Used  None      Program Oxygen Prescription   Program Oxygen Prescription  E-Tanks;Continuous    Comments  Patient will need oxygen during Pulmonary Rehab. Patient desaturated to 84% during . Will conduct titration first day of exercise.        Oxygen Re-Evaluation:   Oxygen Discharge (Final Oxygen Re-Evaluation):   Initial Exercise Prescription: Initial Exercise Prescription - 04/05/18 1600      Date of Initial Exercise RX and Referring Provider   Date  04/05/18    Referring Provider  Dr. Vassie Loll      Oxygen   Oxygen  Continuous    Liters  --   will titrate on first day     Bike   Level  0.4    Minutes  17      NuStep  Level  2    SPM  80    Minutes  17    METs  1.5      Track   Laps  10    Minutes  17      Prescription Details   Frequency (times per week)  2    Duration  Progress to 45 minutes of aerobic exercise without signs/symptoms of physical distress      Intensity   THRR 40-80% of Max Heartrate  56-111    Ratings of Perceived Exertion  11-13    Perceived Dyspnea  0-4      Progression   Progression  Continue to progress workloads to maintain intensity without signs/symptoms of physical distress.      Resistance Training   Training Prescription  Yes    Weight  BLUE BANDS    Reps  10-15       Perform  Capillary Blood Glucose checks as needed.  Exercise Prescription Changes:   Exercise Comments:   Exercise Goals and Review: Exercise Goals    Row Name 04/04/18 1426             Exercise Goals   Increase Physical Activity  Yes       Intervention  Provide advice, education, support and counseling about physical activity/exercise needs.;Develop an individualized exercise prescription for aerobic and resistive training based on initial evaluation findings, risk stratification, comorbidities and participant's personal goals.       Expected Outcomes  Short Term: Attend rehab on a regular basis to increase amount of physical activity.;Long Term: Exercising regularly at least 3-5 days a week.;Long Term: Add in home exercise to make exercise part of routine and to increase amount of physical activity.       Increase Strength and Stamina  Yes       Intervention  Provide advice, education, support and counseling about physical activity/exercise needs.;Develop an individualized exercise prescription for aerobic and resistive training based on initial evaluation findings, risk stratification, comorbidities and participant's personal goals.       Expected Outcomes  Short Term: Increase workloads from initial exercise prescription for resistance, speed, and METs.;Short Term: Perform resistance training exercises routinely during rehab and add in resistance training at home;Long Term: Improve cardiorespiratory fitness, muscular endurance and strength as measured by increased METs and functional capacity ( )       Able to understand and use rate of perceived exertion (RPE) scale  Yes       Intervention  Provide education and explanation on how to use RPE scale       Expected Outcomes  Short Term: Able to use RPE daily in rehab to express subjective intensity level;Long Term:  Able to use RPE to guide intensity level when exercising independently       Able to understand and use Dyspnea scale  Yes        Intervention  Provide education and explanation on how to use Dyspnea scale       Expected Outcomes  Short Term: Able to use Dyspnea scale daily in rehab to express subjective sense of shortness of breath during exertion;Long Term: Able to use Dyspnea scale to guide intensity level when exercising independently       Knowledge and understanding of Target Heart Rate Range (THRR)  Yes       Intervention  Provide education and explanation of THRR including how the numbers were predicted and where they are located for reference  Expected Outcomes  Short Term: Able to state/look up THRR;Long Term: Able to use THRR to govern intensity when exercising independently;Short Term: Able to use daily as guideline for intensity in rehab       Understanding of Exercise Prescription  Yes       Intervention  Provide education, explanation, and written materials on patient's individual exercise prescription       Expected Outcomes  Short Term: Able to explain program exercise prescription;Long Term: Able to explain home exercise prescription to exercise independently          Exercise Goals Re-Evaluation :   Discharge Exercise Prescription (Final Exercise Prescription Changes):   Nutrition:  Target Goals: Understanding of nutrition guidelines, daily intake of sodium 1500mg , cholesterol 200mg , calories 30% from fat and 7% or less from saturated fats, daily to have 5 or more servings of fruits and vegetables.  Biometrics:    Nutrition Therapy Plan and Nutrition Goals: Nutrition Therapy & Goals - 04/06/18 0746      Nutrition Therapy   Diet  heart healthy      Personal Nutrition Goals   Nutrition Goal  pt to identify and limit food sources of sodium, saturated fat, trans fat, refined carbohydrates    Personal Goal #2  identify food quantitites necessary to achieve wt loss of 1/2 -2# per week to a goal wt loss of 2.7-10.9 kg at graduation from pulmonary rehab    Personal Goal #3  describe the  benefit of including fruits, vegetables, whole grains, and low-fat dairy products in a healthy meal plan      Intervention Plan   Intervention  Prescribe, educate and counsel regarding individualized specific dietary modifications aiming towards targeted core components such as weight, hypertension, lipid management, diabetes, heart failure and other comorbidities.    Expected Outcomes  Short Term Goal: Understand basic principles of dietary content, such as calories, fat, sodium, cholesterol and nutrients.;Long Term Goal: Adherence to prescribed nutrition plan.       Nutrition Assessments: Nutrition Assessments - 04/06/18 0749      Rate Your Plate Scores   Pre Score  42       Nutrition Goals Re-Evaluation:   Nutrition Goals Discharge (Final Nutrition Goals Re-Evaluation):   Psychosocial: Target Goals: Acknowledge presence or absence of significant depression and/or stress, maximize coping skills, provide positive support system. Participant is able to verbalize types and ability to use techniques and skills needed for reducing stress and depression.  Initial Review & Psychosocial Screening: Initial Psych Review & Screening - 04/04/18 1438      Initial Review   Current issues with  Current Stress Concerns    Source of Stress Concerns  Family    Comments  Cares for wife who is 58 year old.  Has respite care at times      [facility]   Good Support System?  Yes      Barriers   Psychosocial barriers to participate in program  There are no identifiable barriers or psychosocial needs.;The patient should benefit from training in stress management and relaxation.      Screening Interventions   Expected Outcomes  Long Term goal: The participant improves quality of Life and PHQ9 Scores as seen by post scores and/or verbalization of changes;Short Term goal: Identification and review with participant of any Quality of Life or Depression concerns found by scoring the questionnaire.         Quality of Life Scores:  Scores of 19 and below usually indicate  a poorer quality of life in these areas.  A difference of  2-3 points is a clinically meaningful difference.  A difference of 2-3 points in the total score of the Quality of Life Index has been associated with significant improvement in overall quality of life, self-image, physical symptoms, and general health in studies assessing change in quality of life.  PHQ-9: Recent Review Flowsheet Data    Depression screen [facility] 2/9 04/04/2018 02/23/2015   Decreased Interest 0 0   Down, Depressed, Hopeless 0  0   PHQ - 2 Score 0 0   Altered sleeping 0  -   Tired, decreased energy 0 -   Change in appetite 0 -   Feeling bad or failure about yourself  0 -   Trouble concentrating 1 -   Moving slowly or fidgety/restless 1 -   Suicidal thoughts 0 -   PHQ-9 Score 2 -   Difficult doing work/chores Not difficult at all -     Interpretation of Total Score  Total Score Depression Severity:  1-4 = Minimal depression, 5-9 = Mild depression, 10-14 = Moderate depression, 15-19 = Moderately severe depression, 20-27 = Severe depression   Psychosocial Evaluation and Intervention: Psychosocial Evaluation - 04/12/18 1726      Psychosocial Evaluation & Interventions   Interventions  Stress management education    Comments  Pt has a realistic outlook on his future.  Pt participates in clinical trials in an effort to give back.  Plans to attend the support group meeting next week for Pulmonary Fibrosis    Expected Outcomes  Pt will seek community resources to assit with his wifes care.  Pt will display healthy and positive coping skills    Continue Psychosocial Services   Follow up required by staff       Psychosocial Re-Evaluation: Psychosocial Re-Evaluation    Row Name 04/12/18 1726 04/12/18 1727 04/12/18 1728         Psychosocial Re-Evaluation   Current issues with  None Identified  -  Current Stress Concerns     Comments  Pt is  upbeat and positve.  Very knowledgeable about his disease process  -  wife is advanced age and requires 24/7 care     Expected Outcomes  -  Pt will continue to possess mental well being  -     Interventions  Relaxation education;Stress management education;Encouraged to attend Pulmonary Rehabilitation for the exercise  -  -     Continue Psychosocial Services   Follow up required by staff  -  -        Psychosocial Discharge (Final Psychosocial Re-Evaluation): Psychosocial Re-Evaluation - 04/12/18 1728      Psychosocial Re-Evaluation   Current issues with  Current Stress Concerns    Comments  wife is advanced age and requires 24/7 care       Education: Education Goals: Education classes will be provided on a weekly basis, covering required topics. Participant will state understanding/return demonstration of topics presented.  Learning Barriers/Preferences: Learning Barriers/Preferences - 04/04/18 1441      Learning Barriers/Preferences   Learning Preferences  Computer/Internet;Group Instruction;Individual Instruction;Written Material       Education Topics: Risk Factor Reduction:  -Group instruction that is supported by a PowerPoint presentation. Instructor discusses the definition of a risk factor, different risk factors for pulmonary disease, and how the heart and lungs work together.     Nutrition for Pulmonary Patient:  -Group instruction provided by PowerPoint slides, verbal discussion, and written materials  to support subject matter. The instructor gives an explanation and review of healthy diet recommendations, which includes a discussion on weight management, recommendations for fruit and vegetable consumption, as well as protein, fluid, caffeine, fiber, sodium, sugar, and alcohol. Tips for eating when patients are short of breath are discussed.   Pursed Lip Breathing:  -Group instruction that is supported by demonstration and informational handouts. Instructor discusses  the benefits of pursed lip and diaphragmatic breathing and detailed demonstration on how to preform both.     Oxygen Safety:  -Group instruction provided by PowerPoint, verbal discussion, and written material to support subject matter. There is an overview of "What is Oxygen" and "Why do we need it".  Instructor also reviews how to create a safe environment for oxygen use, the importance of using oxygen as prescribed, and the risks of noncompliance. There is a brief discussion on traveling with oxygen and resources the patient may utilize.   Oxygen Equipment:  -Group instruction provided by [facility] utilizing handouts, written materials, and equipment demonstrations.   Signs and Symptoms:  -Group instruction provided by written material and verbal discussion to support subject matter. Warning signs and symptoms of infection, stroke, and heart attack are reviewed and when to call the physician/911 reinforced. Tips for preventing the spread of infection discussed.   Advanced Directives:  -Group instruction provided by verbal instruction and written material to support subject matter. Instructor reviews Advanced Directive laws and proper instruction for filling out document.   Pulmonary Video:  -Group video education that reviews the importance of medication and oxygen compliance, exercise, good nutrition, pulmonary hygiene, and pursed lip and diaphragmatic breathing for the pulmonary patient.   Exercise for the Pulmonary Patient:  -Group instruction that is supported by a PowerPoint presentation. Instructor discusses benefits of exercise, core components of exercise, frequency, duration, and intensity of an exercise routine, importance of utilizing pulse oximetry during exercise, safety while exercising, and options of places to exercise outside of rehab.     Pulmonary Medications:  -Verbally interactive group education provided by instructor with focus on inhaled medications and  proper administration.   Anatomy and Physiology of the Respiratory System and Intimacy:  -Group instruction provided by PowerPoint, verbal discussion, and written material to support subject matter. Instructor reviews respiratory cycle and anatomical components of the respiratory system and their functions. Instructor also reviews differences in obstructive and restrictive respiratory diseases with examples of each. Intimacy, Sex, and Sexuality differences are reviewed with a discussion on how relationships can change when diagnosed with pulmonary disease. Common sexual concerns are reviewed.   MD DAY -A group question and answer session with a medical doctor that allows participants to ask questions that relate to their pulmonary disease state.   OTHER EDUCATION -Group or individual verbal, written, or video instructions that support the educational goals of the pulmonary rehab program.   PULMONARY REHAB [facility]PIRAT[facility]  Date  04/12/18  Educator  Kirt BoysMolly  Instruction Review Code  1- Verbalizes Understanding [Sedentary Lifestyle]      Holiday Eating Survival Tips:  -Group instruction provided by [employer] slides, verbal discussion, and written materials to support subject matter. The instructor gives patients tips, tricks, and techniques to help them not only survive but enjoy the holidays despite the onslaught of food that accompanies the holidays.   Knowledge Questionnaire Score: Knowledge Questionnaire Score - 04/11/18 1519      Knowledge Questionnaire Score   Pre Score  14/18       Core Components/Risk Factors/Patient Goals at Admission: Personal Goals and Risk Factors at Admission - 04/04/18 1434      Core Components/Risk Factors/Patient Goals on Admission    Weight Management  Yes;Weight Loss    Intervention  Obesity: Provide education and appropriate resources to help participant work on and attain dietary  goals.;Weight Management/Obesity: Establish reasonable short term and long term weight goals.;Weight Management: Provide education and appropriate resources to help participant work on and attain dietary goals.;Weight Management: Develop a combined nutrition and exercise program designed to reach desired caloric intake, while maintaining appropriate intake of nutrient and fiber, sodium and fats, and appropriate energy expenditure required for the weight goal.    Admit Weight  207 lb 10.8 oz (94.2 kg)    Goal Weight: Short Term  200 lb (90.7 kg)    Goal Weight: Long Term  190 lb (86.2 kg)    Expected Outcomes  Short Term: Continue to assess and modify interventions until short term weight is achieved;Weight Loss: Understanding of general recommendations for a balanced deficit meal plan, which promotes 1-2 lb weight loss per week and includes a negative energy balance of [phone] kcal/d;Understanding recommendations for meals to include 15-35% energy as protein, 25-35% energy from fat, 35-60% energy from carbohydrates, less than 200mg  of dietary cholesterol, 20-35 gm of total fiber daily;Understanding of distribution of calorie intake throughout the day with the consumption of 4-5 meals/snacks    Improve shortness of breath with ADL's  Yes    Intervention  Provide education, individualized exercise plan and daily activity instruction to help decrease symptoms of SOB with activities of daily living.    Expected Outcomes  Short Term: Improve cardiorespiratory fitness to achieve a reduction of symptoms when performing ADLs;Long Term: Be able to perform more ADLs without symptoms or delay the onset of symptoms    Lipids  Yes    Intervention  Provide education and support for participant on nutrition & aerobic/resistive exercise along with prescribed medications to achieve LDL 70mg , HDL >40mg .    Expected Outcomes  Short Term: Participant states understanding of desired cholesterol values and is compliant with  medications prescribed. Participant is following exercise prescription and nutrition guidelines.;Long Term: Cholesterol controlled with medications as prescribed, with individualized exercise RX and with personalized nutrition plan. Value goals: LDL < 70mg , HDL > 40 mg.    Stress  Yes    Intervention  Offer individual and/or small group education and counseling on adjustment to heart disease, stress management and health-related lifestyle change. Teach and support self-help strategies.    Expected Outcomes  Short Term: Participant demonstrates changes in health-related behavior, relaxation and other stress management skills, ability to obtain effective social support, and compliance with psychotropic medications if prescribed.;Long Term: Emotional wellbeing is indicated by absence of clinically significant psychosocial distress or social isolation.   cares for 28 year old wife      Core Components/Risk Factors/Patient Goals Review:  Goals and Risk Factor Review    Row Name 04/12/18 1728             Core Components/Risk Factors/Patient Goals Review   Personal Goals Review  Weight Management/Obesity;Develop more efficient breathing techniques such as purse lipped breathing and diaphragmatic breathing and practicing self-pacing with activity.;Stress;Increase knowledge of respiratory medications and ability to use respiratory devices properly.;Improve shortness of breath with ADL's       Review  Pt completed 1 exercise session.  Unable to assess pt progress.  Anticipate  pt will show measurable progress within the next 30 day assessment       Expected Outcomes  See Admission Goals/Outcomes          Core Components/Risk Factors/Patient Goals at Discharge (Final Review):  Goals and Risk Factor Review - 04/12/18 1728      Core Components/Risk Factors/Patient Goals Review   Personal Goals Review  Weight Management/Obesity;Develop more efficient breathing techniques such as purse lipped breathing  and diaphragmatic breathing and practicing self-pacing with activity.;Stress;Increase knowledge of respiratory medications and ability to use respiratory devices properly.;Improve shortness of breath with ADL's    Review  Pt completed 1 exercise session.  Unable to assess pt progress.  Anticipate pt will show measurable progress within the next 30 day assessment    Expected Outcomes  See Admission Goals/Outcomes       ITP Comments: ITP Comments    Row Name 04/04/18 1401 04/12/18 1725         ITP Comments  Dr, Charlestine Massed, Medical Director Pulmonary Rehab  Dr, Charlestine Massed, Medical Director Pulmonary Rehab         Comments: Pt completed 1 exercise session. Alanson Aly, BSN Cardiac and Emergency planning/management officer

## 2018-04-17 ENCOUNTER — Encounter (HOSPITAL_COMMUNITY)
Admission: RE | Admit: 2018-04-17 | Discharge: 2018-04-17 | Disposition: A | Discharge status: Home / Self Care | Payer: Medicare Other | Source: Ambulatory Visit | Attending: Internal Medicine | Admitting: Internal Medicine

## 2018-04-17 DIAGNOSIS — J84112 Idiopathic pulmonary fibrosis: Secondary | ICD-10-CM | POA: Diagnosis not present

## 2018-04-17 NOTE — Progress Notes (Signed)
Daily Session Note  Patient Details  Name: Ian Hale MRN: [id number] Date of Birth: 11/05/1934 Referring Provider:     Pulmonary Rehab Walk Test from 04/05/2018 in [facility]  Referring Provider  Dr. Alva      Encounter Date: 04/17/2018  Check In: Session Check In - 04/17/18 1424      Check-In   Supervising physician immediately available to respond to emergencies  [facility] immediately available    Physician(s)  Dr.Danford     Location  [facility]    Staff Present  Carlette Carlton, RN, BSN;Molly DiVincenzo, MS, ACSM RCEP, Exercise Physiologist;Annedrea Stackhouse, RN, MHA;Maria Whitaker, RN, BSN    Medication changes reported      No    Fall or balance concerns reported     No    Tobacco Cessation  No Change    Warm-up and Cool-down  Performed as group-led instruction    Resistance Training Performed  Yes    VAD Patient?  No    PAD/SET Patient?  No      Pain Assessment   Currently in Pain?  No/denies       Capillary Blood Glucose: No results found for this or any previous visit (from the past 24 hour(s)).    Social History   Tobacco Use  Smoking Status Never Smoker  Smokeless Tobacco Never Used    Goals Met:  Exercise tolerated well  Goals Unmet:  Not Applicable  Comments: Service time is from 1:30p to 3:15p    Dr. Wesam G. Yacoub is Medical Director for Pulmonary Rehab at [facility]. 

## 2018-04-19 ENCOUNTER — Ambulatory Visit: Payer: Medicare Other | Admitting: Cardiovascular Disease

## 2018-04-19 ENCOUNTER — Encounter: Payer: Self-pay | Admitting: Cardiovascular Disease

## 2018-04-19 ENCOUNTER — Encounter (HOSPITAL_COMMUNITY): Payer: Medicare Other

## 2018-04-19 ENCOUNTER — Ambulatory Visit: Payer: Medicare Other | Admitting: Internal Medicine

## 2018-04-19 VITALS — BP 114/65 | HR 65 | Ht 67.0 in | Wt 206.0 lb

## 2018-04-19 DIAGNOSIS — E782 Mixed hyperlipidemia: Secondary | ICD-10-CM | POA: Diagnosis not present

## 2018-04-19 DIAGNOSIS — I2510 Atherosclerotic heart disease of native coronary artery without angina pectoris: Secondary | ICD-10-CM | POA: Diagnosis not present

## 2018-04-19 NOTE — Patient Instructions (Signed)
Medication Instructions:  Your physician recommends that you continue on your current medications as directed. Please refer to the Current Medication list given to you today.   Labwork: None Ordered   Testing/Procedures: None Ordered   Follow-Up: Your physician wants you to follow-up in: 6 months with Scott Weaver, PA.  You will receive a reminder letter in the mail two months in advance. If you don't receive a letter, please call our office to schedule the follow-up appointment.   If you need a refill on your cardiac medications before your next appointment, please call your pharmacy.   Thank you for choosing [facility]! Shaletha Humble, RN [phone]    

## 2018-04-19 NOTE — Progress Notes (Signed)
Cardiology Office Note   Date:  04/19/2018   ID:  ZIERE DOCKEN, DOB 03/21/1935, MRN [id number]  PCP:  Chilton Greathouse, MD  Cardiologist:   Kristeen Miss, MD   Chief Complaint  Patient presents with  . Coronary Artery Disease   Problem List   1.  Coronary artery disease- status post coronary artery bypass grafting , 02/17/06   2. Hypertension 3. Hyperlipidemia 4.  Idiopathic pulmonary fibrosis     Ian Hale is a 83 y.o. male who presents for his CAD. Has a remote hx of CAD and CABG .  Previously saw Bensimhon.  No CP or dyspnea.  Is active  Gets some occasional tingling in his fingers .  Aug. 22 , 2018:  Ian Hale is seen today for follow up of his CAD. Not exercising as much as he would like Had [employer] screening  - has mild bilateral carotid artery disease   October 27, 2017:   Ian Hale is seen back today for follow-up of his coronary artery disease, hypertension and hyperlipidemia.  He had coronary artery bypass grafting in July, 2007. BP is a bit low today . Has been diagnosed with idiopathic pulmonary fibrosis Has not had a good appetite  And has lost some weight   Sept. 26, 2019:  Ian Hale is seen back today . Has been diagnosied with idiopathic pulmonary fibrosis  Breathing capacity is slowly declining .  Has home O2 if he needs it ,  Is on some medication  ( OFEV)   No CP,    Past Medical History:  Diagnosis Date  . Allergy   . BPH (benign prostatic hyperplasia)   . CAD (coronary artery disease)   . Cholesteatoma of left ear   . ED (erectile dysfunction)   . Glaucoma   . Glaucoma   . Hearing loss of both ears   . Hyperlipidemia   . Hypertension     Past Surgical History:  Procedure Laterality Date  . CLEFT PALATE REPAIR  at birth  . CORONARY ARTERY BYPASS GRAFT  01/2006     Current Outpatient Medications  Medication Sig Dispense Refill  . aspirin 81 MG tablet Take 81 mg by mouth daily.      Marland Kitchen atorvastatin (LIPITOR) 80 MG tablet TAKE 1  TABLET(80 MG) BY MOUTH DAILY 90 tablet 0  . bimatoprost (LUMIGAN) 0.01 % SOLN Place 1 drop into both eyes at bedtime.    . cyanocobalamin 2000 MCG tablet Take 2,000 mcg by mouth 2 (two) times daily.    . furosemide (LASIX) 20 MG tablet TAKE 1 TABLET(20 MG) BY MOUTH DAILY 90 tablet 3  . metoprolol tartrate (LOPRESSOR) 25 MG tablet TAKE 1/2 TABLET(12.5 MG) BY MOUTH TWICE DAILY 90 tablet 3  . nitroGLYCERIN (NITROSTAT) 0.4 MG SL tablet Place 1 tablet (0.4 mg total) under the tongue every 5 (five) minutes as needed. 25 tablet 1  . OFEV 150 MG CAPS     . Omega-3 Fatty Acids (FISH OIL PO) Take 2 capsules by mouth. Pt takes 4000mg     . potassium chloride SA (K-DUR,KLOR-CON) 20 MEQ tablet Take 0.5 tablets (10 mEq total) by mouth daily. 45 tablet 2  . saw palmetto 160 MG capsule Take 160 mg by mouth daily.      No current facility-administered medications for this visit.     Allergies:   Patient has no known allergies.    Social History:  The patient  reports that he has never smoked. He has never used  smokeless tobacco. He reports that he does not drink alcohol or use drugs.   Family History:  The patient's family history includes Depression in his father; Diabetes in his father; Heart failure in his father and mother.    ROS:     Physical Exam: Blood pressure 114/65, pulse 65, height 5\' 7"  (1.702 m), weight 206 lb (93.4 kg), SpO2 93 %.  GEN: Chronically ill-appearing gentleman, no acute distress. HEENT: Normal NECK: No JVD; No carotid bruits LYMPHATICS: No lymphadenopathy CARDIAC: Rate S1-S2. RESPIRATORY: On rales in the bases. ABDOMEN: Soft, non-tender, non-distended MUSCULOSKELETAL:  No edema; No deformity  SKIN: Warm and dry NEUROLOGIC:  Alert and oriented x 3   ECG :   April 19, 2018: Normal sinus rhythm at 65.  First-degree AV block.  Occasional premature ventricular contraction.   Recent Labs: 08/14/2017: BUN 30; Creatinine, Ser 1.42; Hemoglobin 16.7; Platelets 220.0;  Potassium 4.5; Sodium 139 03/16/2018: ALT 18    Lipid Panel    Component Value Date/Time   CHOL 120 (L) 01/01/2016 0943   TRIG 108 01/01/2016 0943   HDL 34 (L) 01/01/2016 0943   CHOLHDL 3.5 01/01/2016 0943   VLDL 22 01/01/2016 0943   LDLCALC 64 01/01/2016 0943      Wt Readings from Last 3 Encounters:  04/19/18 206 lb (93.4 kg)  04/04/18 207 lb 10.8 oz (94.2 kg)  03/16/18 207 lb (93.9 kg)      Other studies Reviewed: Additional studies/ records that were reviewed today include: . Review of the above records demonstrates:    ASSESSMENT AND PLAN:  1.  Coronary artery disease- status post coronary artery bypass grafting , 02/17/06 . No angina . Continue current meds   2. Hypertension- -   We stopped Lisinopril at last visit . Seems to be doing well   3.  Idiopathic Pulmonary Fibrosis :    Slow progression   4. Mild Carotid artery disease.   Current medicines are reviewed at length with the patient today.  The patient does not have concerns regarding medicines.  The following changes have been made:  no change  Labs/ tests ordered today include:  No orders of the defined types were placed in this encounter.    Disposition:   FU with  Tereso Newcomer , PA in 6 months  ,  I'll see him in 1 year     Kristeen Miss, MD  04/19/2018 1:58 PM    [facility] [address] [place], [place], [place]  [address] Phone: [phone]; Fax: [phone]

## 2018-04-24 ENCOUNTER — Encounter (HOSPITAL_COMMUNITY)
Admission: RE | Admit: 2018-04-24 | Discharge: 2018-04-24 | Disposition: A | Discharge status: Home / Self Care | Payer: Medicare Other | Source: Ambulatory Visit | Attending: Internal Medicine | Admitting: Internal Medicine

## 2018-04-24 DIAGNOSIS — E785 Hyperlipidemia, unspecified: Secondary | ICD-10-CM | POA: Diagnosis not present

## 2018-04-24 DIAGNOSIS — R942 Abnormal results of pulmonary function studies: Secondary | ICD-10-CM | POA: Insufficient documentation

## 2018-04-24 DIAGNOSIS — N400 Enlarged prostate without lower urinary tract symptoms: Secondary | ICD-10-CM | POA: Insufficient documentation

## 2018-04-24 DIAGNOSIS — I2510 Atherosclerotic heart disease of native coronary artery without angina pectoris: Secondary | ICD-10-CM | POA: Diagnosis not present

## 2018-04-24 DIAGNOSIS — R918 Other nonspecific abnormal finding of lung field: Secondary | ICD-10-CM | POA: Insufficient documentation

## 2018-04-24 DIAGNOSIS — I10 Essential (primary) hypertension: Secondary | ICD-10-CM | POA: Diagnosis not present

## 2018-04-24 DIAGNOSIS — Z79899 Other long term (current) drug therapy: Secondary | ICD-10-CM | POA: Insufficient documentation

## 2018-04-24 DIAGNOSIS — J84112 Idiopathic pulmonary fibrosis: Secondary | ICD-10-CM | POA: Diagnosis not present

## 2018-04-24 DIAGNOSIS — Z7982 Long term (current) use of aspirin: Secondary | ICD-10-CM | POA: Diagnosis not present

## 2018-04-24 NOTE — Progress Notes (Signed)
Eason Housman Mcdonnell 83 y.o. male   DOB: 1935-02-14 MRN: [id number]          Nutrition No diagnosis found. Past Medical History:  Diagnosis Date  . Allergy   . BPH (benign prostatic hyperplasia)   . CAD (coronary artery disease)   . Cholesteatoma of left ear   . ED (erectile dysfunction)   . Glaucoma   . Glaucoma   . Hearing loss of both ears   . Hyperlipidemia   . Hypertension      Meds reviewed.   Current Outpatient Medications (Cardiovascular):  .  atorvastatin (LIPITOR) 80 MG tablet, TAKE 1 TABLET(80 MG) BY MOUTH DAILY .  furosemide (LASIX) 20 MG tablet, TAKE 1 TABLET(20 MG) BY MOUTH DAILY .  metoprolol tartrate (LOPRESSOR) 25 MG tablet, TAKE 1/2 TABLET(12.5 MG) BY MOUTH TWICE DAILY .  nitroGLYCERIN (NITROSTAT) 0.4 MG SL tablet, Place 1 tablet (0.4 mg total) under the tongue every 5 (five) minutes as needed.  Current Outpatient Medications (Respiratory):  Marland Kitchen  OFEV 150 MG CAPS,   Current Outpatient Medications (Analgesics):  .  aspirin 81 MG tablet, Take 81 mg by mouth daily.    Current Outpatient Medications (Hematological):  .  cyanocobalamin 2000 MCG tablet, Take 2,000 mcg by mouth 2 (two) times daily.  Current Outpatient Medications (Other):  .  bimatoprost (LUMIGAN) 0.01 % SOLN, Place 1 drop into both eyes at bedtime. .  Omega-3 Fatty Acids (FISH OIL PO), Take 2 capsules by mouth. Pt takes 4000mg  .  potassium chloride SA (K-DUR,KLOR-CON) 20 MEQ tablet, Take 0.5 tablets (10 mEq total) by mouth daily. .  saw palmetto 160 MG capsule, Take 160 mg by mouth daily.   Ht: Ht Readings from Last 1 Encounters:  04/19/18 5\' 7"  (1.702 m)     Wt:  Wt Readings from Last 3 Encounters:  04/19/18 206 lb (93.4 kg)  04/04/18 207 lb 10.8 oz (94.2 kg)  03/16/18 207 lb (93.9 kg)     BMI: There is no height or weight on file to calculate BMI.     Current tobacco use? No  Labs:  Lipid Panel     Component Value Date/Time   CHOL 120 (L) 01/01/2016 0943   TRIG 108 01/01/2016 0943    HDL 34 (L) 01/01/2016 0943   CHOLHDL 3.5 01/01/2016 0943   VLDL 22 01/01/2016 0943   LDLCALC 64 01/01/2016 0943    No results found for: HGBA1C Note Spoke with pt. Pt is obese.  Pt eats 2 meals a day, shared he does not get hungry, but will snack late at night. Discussed with patient that delayed hunger may occur when he skips meals and snacks during the day. Additionally discussed with patient that eating regularly across the day may increase metabolism and help manage hunger. Food is mostly prepared at home. Making healthy food choices the majority of the time. Pt's Rate Your Plate results reviewed with pt. Pt does not avoid salty food; uses canned/ convenience food.  Pt adds salt to food.  The role of sodium in lung disease reviewed with pt. Educated pt on how to read labels and distributed handout. Set goal with patient to decrease amount of sodium consumed and to decrease portions sizes. Pt expressed understanding of the information reviewed.  Nutrition Diagnosis ? Overweight/obesity related to excessive energy intake as evidenced by a BMI of 32.26 Nutrition Intervention ? Pt's individual nutrition plan and goals reviewed with pt. ? Benefits of adopting healthy eating habits discussed when pt's Rate  Your Plate reviewed.  Goal(s) 1. Pt to identify and limit food sources of sodium, saturated fat, trans fat, and refined carbohydrates 2. Identify food quantities necessary to achieve wt loss of  -2# per week to a goal wt loss of 6-24 lb at graduation from pulmonary rehab. 3. Describe the benefit of including fruits, vegetables, whole grains, and low-fat dairy products in a healthy meal plan.   Plan:  Pt to attend Pulmonary Nutrition class Will provide client-centered nutrition education as part of interdisciplinary care.    Monitor and Evaluate progress toward nutrition goal with team.   Ross Marcus, MS, RD, LDN 04/24/2018 3:08 PM

## 2018-04-24 NOTE — Progress Notes (Signed)
Daily Session Note  Patient Details  Name: Ian Hale MRN: [id number] Date of Birth: October 30, 1934 Referring Provider:     Pulmonary Rehab Walk Test from 04/05/2018 in [place]  Referring Provider  Dr. Elsworth Soho      Encounter Date: 04/24/2018  Check In: Session Check In - 04/24/18 1458      Check-In   Supervising physician immediately available to respond to emergencies  [facility] immediately available    Physician(s)  Dr. Louanne Belton    Location  [facility]    Staff Present  Maurice Small, RN, BSN;Molly DiVincenzo, MS, ACSM RCEP, Exercise Physiologist;Annedrea Rosezella [place], RN, Ramonita Lab, RN    Medication changes reported      No    Fall or balance concerns reported     No    Tobacco Cessation  No Change    Warm-up and Cool-down  Performed as group-led instruction    Resistance Training Performed  Yes    VAD Patient?  No    PAD/SET Patient?  No      Pain Assessment   Currently in Pain?  No/denies       Capillary Blood Glucose: No results found for this or any previous visit (from the past 24 hour(s)).  Exercise Prescription Changes - 04/24/18 1600      Response to Exercise   Blood Pressure (Admit)  120/58    Blood Pressure (Exercise)  134/74    Blood Pressure (Exit)  112/68    Heart Rate (Admit)  69 bpm    Heart Rate (Exercise)  83 bpm    Heart Rate (Exit)  68 bpm    Oxygen Saturation (Admit)  94 %    Oxygen Saturation (Exercise)  90 %    Oxygen Saturation (Exit)  94 %    Rating of Perceived Exertion (Exercise)  13    Perceived Dyspnea (Exercise)  1    Duration  Progress to 45 minutes of aerobic exercise without signs/symptoms of physical distress    Intensity  Other (comment)   40-80% of HRR     Progression   Progression  Continue to progress workloads to maintain intensity without signs/symptoms of physical distress.      Resistance Training   Training Prescription  Yes    Weight  blue bands    Reps   10-15    Time  10 Minutes      Oxygen   Oxygen  Continuous    Liters  2      Bike   Level  0.45    Minutes  17      NuStep   Level  3    SPM  80    Minutes  17    METs  2.1      Track   Laps  10    Minutes  17       Social History   Tobacco Use  Smoking Status Never Smoker  Smokeless Tobacco Never Used    Goals Met:  Exercise tolerated well No report of cardiac concerns or symptoms Strength training completed today  Goals Unmet:  Not Applicable  Comments: Service time is from 1330 to 1515    Dr. Rush Farmer is Medical Director for Pulmonary Rehab at [facility].

## 2018-04-26 ENCOUNTER — Encounter (HOSPITAL_COMMUNITY)
Admission: RE | Admit: 2018-04-26 | Discharge: 2018-04-26 | Disposition: A | Discharge status: Home / Self Care | Payer: Medicare Other | Source: Ambulatory Visit | Attending: Internal Medicine | Admitting: Internal Medicine

## 2018-04-26 DIAGNOSIS — J84112 Idiopathic pulmonary fibrosis: Secondary | ICD-10-CM

## 2018-04-26 NOTE — Progress Notes (Signed)
Daily Session Note  Patient Details  Name: Ian Hale MRN: [id number] Date of Birth: [birth year] Referring Provider:     Pulmonary Rehab Walk Test from 04/05/2018 in [place]  Referring Provider  Dr. Elsworth Soho      Encounter Date: 04/26/2018  Check In: Session Check In - 04/26/18 1413      Check-In   Supervising physician immediately available to respond to emergencies  [facility] immediately available    Physician(s)  Dr. Horris Latino    Location  [facility]    Staff Present  Maurice Small, RN, BSN;Molly DiVincenzo, MS, ACSM RCEP, Exercise Physiologist;Sabriel Borromeo Ysidro Evert, Felipe Drone, RN, MHA    Medication changes reported      No    Fall or balance concerns reported     No    Tobacco Cessation  No Change    Warm-up and Cool-down  Performed as group-led instruction    Resistance Training Performed  Yes    VAD Patient?  No    PAD/SET Patient?  No      Pain Assessment   Currently in Pain?  No/denies    Pain Score  0-No pain       Capillary Blood Glucose: No results found for this or any previous visit (from the past 24 hour(s)).    Social History   Tobacco Use  Smoking Status Never Smoker  Smokeless Tobacco Never Used    Goals Met:  Exercise tolerated well No report of cardiac concerns or symptoms Strength training completed today  Goals Unmet:  Not Applicable  Comments: Service time is from 1330 to 1530    Dr. Rush Farmer is Medical Director for Pulmonary Rehab at [facility].

## 2018-05-01 ENCOUNTER — Encounter (HOSPITAL_COMMUNITY)
Admission: RE | Admit: 2018-05-01 | Discharge: 2018-05-01 | Disposition: A | Discharge status: Home / Self Care | Payer: Medicare Other | Source: Ambulatory Visit | Attending: Internal Medicine | Admitting: Internal Medicine

## 2018-05-01 DIAGNOSIS — J84112 Idiopathic pulmonary fibrosis: Secondary | ICD-10-CM

## 2018-05-01 NOTE — Progress Notes (Addendum)
Daily Session Note  Patient Details  Name: Ian Hale MRN: [id number] Date of Birth: 10/07/1934 Referring Provider:     Pulmonary Rehab Walk Test from 04/05/2018 in [facility]  Referring Provider  Dr. Alva      Encounter Date: 05/01/2018  Check In: Session Check In - 05/01/18 1521      Check-In   Supervising physician immediately available to respond to emergencies  [facility] immediately available    Physician(s)  Dr. Ezenduka    Location  [facility]    Staff Present  Molly DiVincenzo, MS, ACSM RCEP, Exercise Physiologist;Lisa Hughes, RN;Annedrea Stackhouse, RN, MHA    Medication changes reported      No    Fall or balance concerns reported     No    Tobacco Cessation  No Change    Warm-up and Cool-down  Performed as group-led instruction    Resistance Training Performed  Yes    VAD Patient?  No    PAD/SET Patient?  No      Pain Assessment   Currently in Pain?  No/denies    Pain Score  0-No pain    Multiple Pain Sites  No       Capillary Blood Glucose: No results found for this or any previous visit (from the past 24 hour(s)).    Social History   Tobacco Use  Smoking Status Never Smoker  Smokeless Tobacco Never Used    Goals Met:  Exercise tolerated well  Goals Unmet:  Patient's oxygen was increased from 2 liters to 3 liters with exercise   Comments: Service time is from 1:30p to 3:00p    Dr. Wesam G. Yacoub is Medical Director for Pulmonary Rehab at [facility]. 

## 2018-05-03 ENCOUNTER — Encounter (HOSPITAL_COMMUNITY)
Admission: RE | Admit: 2018-05-03 | Discharge: 2018-05-03 | Disposition: A | Discharge status: Home / Self Care | Payer: Medicare Other | Source: Ambulatory Visit | Attending: Internal Medicine | Admitting: Internal Medicine

## 2018-05-03 DIAGNOSIS — J84112 Idiopathic pulmonary fibrosis: Secondary | ICD-10-CM

## 2018-05-03 NOTE — Progress Notes (Signed)
Daily Session Note  Patient Details  Name: Ian Hale MRN: [id number] Date of Birth: [birth year] Referring Provider:     Pulmonary Rehab Walk Test from 04/05/2018 in [place]  Referring Provider  Dr. Elsworth Soho      Encounter Date: 05/03/2018  Check In: Session Check In - 05/03/18 1404      Check-In   Supervising physician immediately available to respond to emergencies  [facility] immediately available    Physician(s)  Dr. Herbert Moors    Location  [facility]    Staff Present  Su Hilt, MS, ACSM RCEP, Exercise Physiologist;Dalton Kris Mouton, MS, Exercise Physiologist;Latori Beggs Colletta [place], RN, MHA    Medication changes reported      No    Fall or balance concerns reported     No    Tobacco Cessation  No Change    Warm-up and Cool-down  Performed as group-led instruction    Resistance Training Performed  Yes    VAD Patient?  No    PAD/SET Patient?  No      Pain Assessment   Currently in Pain?  No/denies    Multiple Pain Sites  No       Capillary Blood Glucose: No results found for this or any previous visit (from the past 24 hour(s)).    Social History   Tobacco Use  Smoking Status Never Smoker  Smokeless Tobacco Never Used    Goals Met:  Exercise tolerated well No report of cardiac concerns or symptoms Strength training completed today  Goals Unmet:  Not Applicable  Comments: Service time is from 1330 to 1540    Dr. Rush Farmer is Medical Director for Pulmonary Rehab at [facility].

## 2018-05-04 ENCOUNTER — Encounter (INDEPENDENT_AMBULATORY_CARE_PROVIDER_SITE_OTHER): Payer: Medicare Other | Admitting: Internal Medicine

## 2018-05-04 ENCOUNTER — Encounter: Payer: Medicare Other | Admitting: *Deleted

## 2018-05-04 DIAGNOSIS — Z006 Encounter for examination for normal comparison and control in clinical research program: Secondary | ICD-10-CM

## 2018-05-04 DIAGNOSIS — J84112 Idiopathic pulmonary fibrosis: Secondary | ICD-10-CM

## 2018-05-04 NOTE — Patient Instructions (Signed)
ICD-10-CM   1. Research subject Z00.6   2. IPF (idiopathic pulmonary fibrosis) (HCC) J84.112     Per prptocol 

## 2018-05-04 NOTE — Research (Signed)
Title: GALACTIC-1 [facility]) is a Phase 2b, randomized, double-blind, parallel, placebo-controlled multicenter international study to evaluate evaluate the efficacy and safety of two doses (placebo v 3mg  v 10mg ) of TD139 administered once a day for 52 weeks as compared to placebo in subjects with Idiopathic Pulmonary Fibrosis. Key Primary End Point: Annual rate of decline in FVC expressed in mL over 52 weeks  Clinical Designer, industrial/product / Research RN note : This visit for Subject Ian Hale with DOB: 24-Mar-1935 on 05/04/2018 for the above protocol is Visit/Encounter # 2 Baseline  and is for purpose of research Subject expressed continued interest and consent in continuing as a study subject. Subject confirmed that there was no   change in contact information (e.g. address, telephone, email). Subject thanked for participation in research and contribution to science.   In this visit 05/04/2018 the subject will be evaluated by investigator named Ramaswamy. This research coordinator has verified that the investigator is uptodate with his/her training logs.  All procedures completed per the above mentioned protocol. Refer to the subjects paper source binder for further documentation.  Signed by, Carron Curie, CMA, [facility] Clinical Research Coordinator / Nurse [place], [place] 4:17 PM 05/04/2018

## 2018-05-04 NOTE — Progress Notes (Signed)
Title: GALACTIC-1 [facility]) is a Phase 2b, randomized, double-blind, parallel, placebo-controlled multicenter international study to evaluate evaluate the efficacy and safety of two doses (placebo v 3mg  v 10mg ) of TD139 administered once a day for 52 weeks as compared to placebo in subjects with Idiopathic Pulmonary Fibrosis. Key Primary End Point: Annual rate of decline in FVC expressed in mL over 52 weeks  Protocol #: GALACTIC-1, Clinical Trials #: [id number]. Sponsor: [email or web address] ([place], [place]) IB Version 8.0 dated 07Dec2018 [place] Main ICF Version Number: 5.1.0; Date: 30-Aug-2017 Local Site Version 1.0 dated 29April2019  - Please note: ICF relevant to Protocol V4.8_US is work in progress status-  Key Features of TD139 the study drug: a Galectin-3 inhibitor designed specifically to modulate the fibrogenic response to tissue injury. In multiple models of organ fibrosis, it has been demonstrated that Gal-3 is potently pro-fibrotic, modulating the activity of fibroblasts and macrophages in chronically injured tissues. It is hypothesized that increased Gal-3 expression during chronic injury leads to fibroblast activation via the aggregation of growth factor receptor clusters (such as TGF-), thereby amplifying profibrotic signal cascades.  Key Inclusion Criteria:  Age ? 78 y Diagnosis of IPF established during the previous 3 years An historical diagnostic HRCT scan within the 12 months prior to screening. Note: a separate HRCT scan will not be performed as part of the study. Any existing SoC treatment must be deemed as stable by the PI/treating physician before randomization into the study. a. FVC > 45% of the predicted  b. DLCO (corrected for Hb) of 30% to 79% of the predicted value at screening c. FEV1/FVC ?  0.7  Women of childbearing potential agree to use highly effective birth control methods during the study  Key Exclusion Criteria  Has a history of malignancy within the last  2 years with the exception of basal cell carcinoma, chronic lymphocytic leukaemia and prostate cancer requiring androgens, localised treatment and/or managed by observation. Is likely to receive lung transplantation within the next 12 months Currently receiving investigational therapy for IPF or administration of such therapeutics within 4 weeks of initial screening (or 5 half-lives, whichever is longer) Currently receiving high dose corticosteroids, cytotoxic, and or vasodilator therapy for pulmonary hypertension or administration of such therapeutics within 4 weeks of initial screening (or 5 half-lives, whichever is longer). A current dose of less than or equal to 15 mg/day of prednisone or its equivalent is acceptable if the dose is anticipated to remain stable during the study. Short term use (<1 month) of higher dose corticosteroid treatment is also permitted. Has a history of unstable or deteriorating cardiac or pulmonary disease (other than IPF) within the previous six months Has clinical evidence of active infection   Half-life: 3.5-8 hours For the 3 mg dose, the t1/2 is 3.5 hours. For 10 mg dose, the t1/2 is closer to 8 hours  Interactions No clinically significant CYP interactions  Safety Data Version 3.0. 03 Jul 2017  Animal studies: - No effect on central nervous system - No effect on blood pressure, heart rate or mean arterial pressure - No effect on QTc - No effect on respiratory system - No mention of renal/hepatic effect  Phase I studies: Conducted in 36 healthy patients, 41% reported treatment emergent adverse events (TEAE); all were mild in nature. The most frequently reported TEAE was dysgeusia. This occurred in 36% of patients and was transient. 8% of patients reported cough. 2 patients (5%) were diagnosed with an upper respiratory tract infection during the study, and  required  treatment with antibiotics. 1 patient (2%) experienced a serious adverse event; pneumonia,  this was fatal. Little systemic effect seen thus far.   ......................... This visit for Subject Ian Hale with DOB: 1935-03-24 on 05/04/2018 for the above protocol is Visit/Encounter # V2, Week 0, Baseline  and is for purpose of research randomization . Subject/LAR expressed continued interest and consent in continuing as a study subject. Subject thanked for participation in research and contribution to science.  Since his last consent visit he has not had no new problems.  He continues to be stable.  He has started pulmonary rehabilitation.  In this visit according to the protocol he will be randomized.  I went through the inclusion exclusion criteria personally and y did an exam.  Of note, source document/protocol reports that at home he is dosing has to be around the same time ideally before noon.  This randomization visit everything was time so that he will get randomized and dosed before noon.  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  Although is not a protocol deviation according to the protocol it is not an ideal state.  We have made note of this.   EXAM Done and in source talk   A/P   ICD-10-CM   1. Research subject Z00.6   2. IPF (idiopathic pulmonary fibrosis) (HCC) J84.112    Ekg, spiro, , questionnaires, randomization etc., all to follow     SIGNATURE    Dr. Kalman Shan, M.D., [name], [name] Pulmonary and Critical Care Medicine Research Investigator, [facility] @ [facility] Health Staff Physician, [facility] Center Director - Interstitial Lung Disease  Program  Pulmonary Fibrosis [facility] [place] [facility] and [facility] @ [facility] [place], [place], [address]  Pager: [phone], If no answer or between  15:00h - 7:00h: call [phone] Telephone: [phone]  11:36 AM 05/04/2018

## 2018-05-08 ENCOUNTER — Encounter (HOSPITAL_COMMUNITY)
Admission: RE | Admit: 2018-05-08 | Discharge: 2018-05-08 | Disposition: A | Discharge status: Home / Self Care | Payer: Medicare Other | Source: Ambulatory Visit | Attending: Internal Medicine | Admitting: Internal Medicine

## 2018-05-08 VITALS — Wt 202.6 lb

## 2018-05-08 DIAGNOSIS — J84112 Idiopathic pulmonary fibrosis: Secondary | ICD-10-CM

## 2018-05-08 NOTE — Progress Notes (Signed)
Daily Session Note  Patient Details  Name: Ian Hale MRN: [id number] Date of Birth: 24-Dec-1934 Referring Provider:     Pulmonary Rehab Walk Test from 04/05/2018 in [facility]  Referring Provider  Dr. Elsworth Soho      Encounter Date: 05/08/2018  Check In: Session Check In - 05/08/18 1334      Check-In   Supervising physician immediately available to respond to emergencies  [facility] immediately available    Physician(s)   Dr. Herbert Moors    Location  [facility]    Staff Present  Hoy Register, MS, Exercise Physiologist;Lisa Ysidro Evert, Felipe Drone, RN, MHA;Molly DiVincenzo, MS, ACSM RCEP, Exercise Physiologist    Medication changes reported      No    Fall or balance concerns reported     No    Tobacco Cessation  No Change    Warm-up and Cool-down  Performed as group-led instruction    Resistance Training Performed  Yes    VAD Patient?  No    PAD/SET Patient?  No      Pain Assessment   Currently in Pain?  No/denies    Multiple Pain Sites  No       Capillary Blood Glucose: No results found for this or any previous visit (from the past 24 hour(s)).  Exercise Prescription Changes - 05/08/18 1500      Response to Exercise   Blood Pressure (Admit)  120/54    Blood Pressure (Exercise)  116/60    Blood Pressure (Exit)  102/78    Heart Rate (Admit)  65 bpm    Heart Rate (Exercise)  82 bpm    Heart Rate (Exit)  64 bpm    Oxygen Saturation (Admit)  96 %    Oxygen Saturation (Exercise)  88 %    Oxygen Saturation (Exit)  98 %    Rating of Perceived Exertion (Exercise)  13    Perceived Dyspnea (Exercise)  2    Duration  Progress to 45 minutes of aerobic exercise without signs/symptoms of physical distress    Intensity  Other (comment)   40-80% of HRR     Progression   Progression  Continue to progress workloads to maintain intensity without signs/symptoms of physical distress.      Resistance Training   Training  Prescription  Yes    Weight  blue bands    Reps  10-15    Time  10 Minutes      Oxygen   Oxygen  Continuous    Liters  2      Bike   Level  0.45    Minutes  17      NuStep   Level  3    SPM  80    Minutes  17    METs  2.8      Track   Laps  15    Minutes  17       Social History   Tobacco Use  Smoking Status Never Smoker  Smokeless Tobacco Never Used    Goals Met:  Exercise tolerated well  Goals Unmet:  Not Applicable  Comments: Service time is from 1:30P to 3:00P    Dr. Rush Farmer is Medical Director for Pulmonary Rehab at [facility].

## 2018-05-09 NOTE — Progress Notes (Signed)
Pulmonary Individual Treatment Plan  Patient Details  Name: Ian Hale MRN: [id number] Date of Birth: 05-01-1935 Referring Provider:     Pulmonary Rehab Walk Test from 04/05/2018 in [facility]  Referring Provider  Dr. Elsworth Soho      Initial Encounter Date:    Pulmonary Rehab Walk Test from 04/05/2018 in [facility]  Date  04/05/18      Visit Diagnosis: Idiopathic pulmonary fibrosis ([place])  Patient's Home Medications on Admission:   Current Outpatient Medications:  .  aspirin 81 MG tablet, Take 81 mg by mouth daily.  , Disp: , Rfl:  .  atorvastatin (LIPITOR) 80 MG tablet, TAKE 1 TABLET(80 MG) BY MOUTH DAILY, Disp: 90 tablet, Rfl: 0 .  bimatoprost (LUMIGAN) 0.01 % SOLN, Place 1 drop into both eyes at bedtime., Disp: , Rfl:  .  cyanocobalamin 2000 MCG tablet, Take 2,000 mcg by mouth 2 (two) times daily., Disp: , Rfl:  .  furosemide (LASIX) 20 MG tablet, TAKE 1 TABLET(20 MG) BY MOUTH DAILY, Disp: 90 tablet, Rfl: 3 .  metoprolol tartrate (LOPRESSOR) 25 MG tablet, TAKE 1/2 TABLET(12.5 MG) BY MOUTH TWICE DAILY, Disp: 90 tablet, Rfl: 3 .  nitroGLYCERIN (NITROSTAT) 0.4 MG SL tablet, Place 1 tablet (0.4 mg total) under the tongue every 5 (five) minutes as needed., Disp: 25 tablet, Rfl: 1 .  OFEV 150 MG CAPS, , Disp: , Rfl:  .  Omega-3 Fatty Acids (FISH OIL PO), Take 2 capsules by mouth. Pt takes 4011m, Disp: , Rfl:  .  potassium chloride SA (K-DUR,KLOR-CON) 20 MEQ tablet, Take 0.5 tablets (10 mEq total) by mouth daily., Disp: 45 tablet, Rfl: 2 .  saw palmetto 160 MG capsule, Take 160 mg by mouth daily. , Disp: , Rfl:   Past Medical History: Past Medical History:  Diagnosis Date  . Allergy   . BPH (benign prostatic hyperplasia)   . CAD (coronary artery disease)   . Cholesteatoma of left ear   . ED (erectile dysfunction)   . Glaucoma   . Glaucoma   . Hearing loss of both ears   . Hyperlipidemia   . Hypertension     Tobacco  Use: Social History   Tobacco Use  Smoking Status Never Smoker  Smokeless Tobacco Never Used    Labs: Recent Review Flowsheet Data    Labs for ITP Cardiac and Pulmonary Rehab Latest Ref Rng & Units 09/27/2011 10/09/2012 10/29/2013 02/23/2015 01/01/2016   Cholestrol 125 - 200 mg/dL 126 120 123 127 120(L)   LDLCALC <130 mg/dL 73 75 75 65 64   HDL >=40 mg/dL 33.10(L) 29.00(L) 32.60(L) 38.60(L) 34(L)   Trlycerides <150 mg/dL 102.0 80.0 77.0 116.0 108      Capillary Blood Glucose: No results found for: GLUCAP   Pulmonary Assessment Scores: Pulmonary Assessment Scores    Row Name 04/05/18 1628 04/11/18 1520       ADL UCSD   ADL Phase  Entry  Entry    SOB Score total  -  68      CAT Score   CAT Score  -  20      mMRC Score   mMRC Score  1  -       Pulmonary Function Assessment:   Exercise Target Goals: Exercise Program Goal: Individual exercise prescription set using results from initial 6 min walk test and THRR while considering  patient's activity barriers and safety.   Exercise Prescription Goal: Initial exercise prescription builds  to 30-45 minutes a day of aerobic activity, 2-3 days per week.  Home exercise guidelines will be given to patient during program as part of exercise prescription that the participant will acknowledge.  Activity Barriers & Risk Stratification:   6 Minute Walk: 6 Minute Walk    Row Name 04/05/18 1628         6 Minute Walk   Phase  Initial     Distance  900 feet     Walk Time  6 minutes     # of Rest Breaks  0     MPH  1.7     METS  2.3     RPE  12     Perceived Dyspnea   1     Resting HR  62 bpm     Resting BP  144/64     Resting Oxygen Saturation   92 %     Exercise Oxygen Saturation  during 6 min walk  84 %     Max Ex. HR  73 bpm     Max Ex. BP  140/62       Interval HR   1 Minute HR  71     2 Minute HR  92     3 Minute HR  73     4 Minute HR  73     5 Minute HR  71     6 Minute HR  71     Interval Heart Rate?  Yes         Interval Oxygen   Interval Oxygen?  Yes     Baseline Oxygen Saturation %  92 %     1 Minute Oxygen Saturation %  91 %     1 Minute Liters of Oxygen  0 L     2 Minute Oxygen Saturation %  90 %     2 Minute Liters of Oxygen  0 L     3 Minute Oxygen Saturation %  85 %     3 Minute Liters of Oxygen  0 L     4 Minute Oxygen Saturation %  86 %     4 Minute Liters of Oxygen  0 L     5 Minute Oxygen Saturation %  84 %     5 Minute Liters of Oxygen  0 L     6 Minute Oxygen Saturation %  84 %     6 Minute Liters of Oxygen  0 L        Oxygen Initial Assessment: Oxygen Initial Assessment - 04/05/18 1627      Initial 6 min Walk   Oxygen Used  None      Program Oxygen Prescription   Program Oxygen Prescription  E-Tanks;Continuous    Comments  Patient will need oxygen during Pulmonary Rehab. Patient desaturated to 84% during 6MWT. Will conduct titration first day of exercise.        Oxygen Re-Evaluation: Oxygen Re-Evaluation    Row Name 05/08/18 1649             Program Oxygen Prescription   Program Oxygen Prescription  E-Tanks;Continuous       Liters per minute  - 2-3         Home Oxygen   Home Oxygen Device  Home Concentrator Patient is waiting to POC delivery       Sleep Oxygen Prescription  None       Home Exercise Oxygen Prescription  None  Home at Rest Exercise Oxygen Prescription  None       Compliance with Home Oxygen Use  No         Goals/Expected Outcomes   Short Term Goals  To learn and exhibit compliance with exercise, home and travel O2 prescription;To learn and understand importance of maintaining oxygen saturations>88%;To learn and demonstrate proper use of respiratory medications;To learn and understand importance of monitoring SPO2 with pulse oximeter and demonstrate accurate use of the pulse oximeter.;To learn and demonstrate proper pursed lip breathing techniques or other breathing techniques.       Long  Term Goals  Exhibits compliance with exercise,  home and travel O2 prescription;Verbalizes importance of monitoring SPO2 with pulse oximeter and return demonstration;Maintenance of O2 saturations>88%;Exhibits proper breathing techniques, such as pursed lip breathing or other method taught during program session;Compliance with respiratory medication;Demonstrates proper use of MDI's       Goals/Expected Outcomes  understanding of the importance of home oxygen with exercise          Oxygen Discharge (Final Oxygen Re-Evaluation): Oxygen Re-Evaluation - 05/08/18 1649      Program Oxygen Prescription   Program Oxygen Prescription  E-Tanks;Continuous    Liters per minute  --   2-3     Home Oxygen   Home Oxygen Device  Home Concentrator   Patient is waiting to POC delivery   Sleep Oxygen Prescription  None    Home Exercise Oxygen Prescription  None    Home at Rest Exercise Oxygen Prescription  None    Compliance with Home Oxygen Use  No      Goals/Expected Outcomes   Short Term Goals  To learn and exhibit compliance with exercise, home and travel O2 prescription;To learn and understand importance of maintaining oxygen saturations>88%;To learn and demonstrate proper use of respiratory medications;To learn and understand importance of monitoring SPO2 with pulse oximeter and demonstrate accurate use of the pulse oximeter.;To learn and demonstrate proper pursed lip breathing techniques or other breathing techniques.    Long  Term Goals  Exhibits compliance with exercise, home and travel O2 prescription;Verbalizes importance of monitoring SPO2 with pulse oximeter and return demonstration;Maintenance of O2 saturations>88%;Exhibits proper breathing techniques, such as pursed lip breathing or other method taught during program session;Compliance with respiratory medication;Demonstrates proper use of MDI's    Goals/Expected Outcomes  understanding of the importance of home oxygen with exercise       Initial Exercise Prescription: Initial Exercise  Prescription - 04/05/18 1600      Date of Initial Exercise RX and Referring Provider   Date  04/05/18    Referring Provider  Dr. Elsworth Soho      Oxygen   Oxygen  Continuous    Liters  --   will titrate on first day     Bike   Level  0.4    Minutes  17      NuStep   Level  2    SPM  80    Minutes  17    METs  1.5      Track   Laps  10    Minutes  17      Prescription Details   Frequency (times per week)  2    Duration  Progress to 45 minutes of aerobic exercise without signs/symptoms of physical distress      Intensity   THRR 40-80% of Max Heartrate  56-111    Ratings of Perceived Exertion  11-13    Perceived Dyspnea  0-4      Progression   Progression  Continue to progress workloads to maintain intensity without signs/symptoms of physical distress.      Resistance Training   Training Prescription  Yes    Weight  BLUE BANDS    Reps  10-15       Perform Capillary Blood Glucose checks as needed.  Exercise Prescription Changes: Exercise Prescription Changes    Row Name 04/24/18 1600 05/08/18 1500           Response to Exercise   Blood Pressure (Admit)  120/58  120/54      Blood Pressure (Exercise)  134/74  116/60      Blood Pressure (Exit)  112/68  102/78      Heart Rate (Admit)  69 bpm  65 bpm      Heart Rate (Exercise)  83 bpm  82 bpm      Heart Rate (Exit)  68 bpm  64 bpm      Oxygen Saturation (Admit)  94 %  96 %      Oxygen Saturation (Exercise)  90 %  88 %      Oxygen Saturation (Exit)  94 %  98 %      Rating of Perceived Exertion (Exercise)  13  13      Perceived Dyspnea (Exercise)  1  2      Duration  Progress to 45 minutes of aerobic exercise without signs/symptoms of physical distress  Progress to 45 minutes of aerobic exercise without signs/symptoms of physical distress      Intensity  Other (comment) 40-80% of HRR  Other (comment) 40-80% of HRR        Progression   Progression  Continue to progress workloads to maintain intensity without  signs/symptoms of physical distress.  Continue to progress workloads to maintain intensity without signs/symptoms of physical distress.        Resistance Training   Training Prescription  Yes  Yes      Weight  blue bands  blue bands      Reps  10-15  10-15      Time  10 Minutes  10 Minutes        Oxygen   Oxygen  Continuous  Continuous      Liters  2  2        Bike   Level  0.45  0.45      Minutes  17  17        NuStep   Level  3  3      SPM  80  80      Minutes  17  17      METs  2.1  2.8        Track   Laps  10  15      Minutes  17  17         Exercise Comments:   Exercise Goals and Review: Exercise Goals    Row Name 04/04/18 1426             Exercise Goals   Increase Physical Activity  Yes       Intervention  Provide advice, education, support and counseling about physical activity/exercise needs.;Develop an individualized exercise prescription for aerobic and resistive training based on initial evaluation findings, risk stratification, comorbidities and participant's personal goals.       Expected Outcomes  Short Term: Attend rehab on a regular basis to increase amount of physical  activity.;Long Term: Exercising regularly at least 3-5 days a week.;Long Term: Add in home exercise to make exercise part of routine and to increase amount of physical activity.       Increase Strength and Stamina  Yes       Intervention  Provide advice, education, support and counseling about physical activity/exercise needs.;Develop an individualized exercise prescription for aerobic and resistive training based on initial evaluation findings, risk stratification, comorbidities and participant's personal goals.       Expected Outcomes  Short Term: Increase workloads from initial exercise prescription for resistance, speed, and METs.;Short Term: Perform resistance training exercises routinely during rehab and add in resistance training at home;Long Term: Improve cardiorespiratory fitness,  muscular endurance and strength as measured by increased METs and functional capacity (6MWT)       Able to understand and use rate of perceived exertion (RPE) scale  Yes       Intervention  Provide education and explanation on how to use RPE scale       Expected Outcomes  Short Term: Able to use RPE daily in rehab to express subjective intensity level;Long Term:  Able to use RPE to guide intensity level when exercising independently       Able to understand and use Dyspnea scale  Yes       Intervention  Provide education and explanation on how to use Dyspnea scale       Expected Outcomes  Short Term: Able to use Dyspnea scale daily in rehab to express subjective sense of shortness of breath during exertion;Long Term: Able to use Dyspnea scale to guide intensity level when exercising independently       Knowledge and understanding of Target Heart Rate Range (THRR)  Yes       Intervention  Provide education and explanation of THRR including how the numbers were predicted and where they are located for reference       Expected Outcomes  Short Term: Able to state/look up THRR;Long Term: Able to use THRR to govern intensity when exercising independently;Short Term: Able to use daily as guideline for intensity in rehab       Understanding of Exercise Prescription  Yes       Intervention  Provide education, explanation, and written materials on patient's individual exercise prescription       Expected Outcomes  Short Term: Able to explain program exercise prescription;Long Term: Able to explain home exercise prescription to exercise independently          Exercise Goals Re-Evaluation : Exercise Goals Re-Evaluation    Row Name 05/08/18 1650             Exercise Goal Re-Evaluation   Exercise Goals Review  Increase Physical Activity;Increase Strength and Stamina;Able to understand and use rate of perceived exertion (RPE) scale;Able to understand and use Dyspnea scale;Knowledge and understanding of  Target Heart Rate Range (THRR);Understanding of Exercise Prescription       Comments  Patient is progressing in program. He has increase average METs from 2.0 to 2.4, increase laps from 10-15 laps (200 ft) in 15 minutes. Patient is waiting on POC delivery, Will discuss home exercise at that point. Will cont to monitor and motivate as able.       Expected Outcomes  Through exercise at rehab and at home, patient will increase functional ability, have a decrease in sob with ADL's, and establish an exercise regimine at home.          Discharge Exercise Prescription (  Final Exercise Prescription Changes): Exercise Prescription Changes - 05/08/18 1500      Response to Exercise   Blood Pressure (Admit)  120/54    Blood Pressure (Exercise)  116/60    Blood Pressure (Exit)  102/78    Heart Rate (Admit)  65 bpm    Heart Rate (Exercise)  82 bpm    Heart Rate (Exit)  64 bpm    Oxygen Saturation (Admit)  96 %    Oxygen Saturation (Exercise)  88 %    Oxygen Saturation (Exit)  98 %    Rating of Perceived Exertion (Exercise)  13    Perceived Dyspnea (Exercise)  2    Duration  Progress to 45 minutes of aerobic exercise without signs/symptoms of physical distress    Intensity  Other (comment)   40-80% of HRR     Progression   Progression  Continue to progress workloads to maintain intensity without signs/symptoms of physical distress.      Resistance Training   Training Prescription  Yes    Weight  blue bands    Reps  10-15    Time  10 Minutes      Oxygen   Oxygen  Continuous    Liters  2      Bike   Level  0.45    Minutes  17      NuStep   Level  3    SPM  80    Minutes  17    METs  2.8      Track   Laps  15    Minutes  17       Nutrition:  Target Goals: Understanding of nutrition guidelines, daily intake of sodium '1500mg'$ , cholesterol '200mg'$ , calories 30% from fat and 7% or less from saturated fats, daily to have 5 or more servings of fruits and  vegetables.  Biometrics:    Nutrition Therapy Plan and Nutrition Goals: Nutrition Therapy & Goals - 04/06/18 0746      Nutrition Therapy   Diet  heart healthy      Personal Nutrition Goals   Nutrition Goal  pt to identify and limit food sources of sodium, saturated fat, trans fat, refined carbohydrates    Personal Goal #2  identify food quantitites necessary to achieve wt loss of 1/2 -2# per week to a goal wt loss of 2.7-10.9 kg at graduation from pulmonary rehab    Personal Goal #3  describe the benefit of including fruits, vegetables, whole grains, and low-fat dairy products in a healthy meal plan      Intervention Plan   Intervention  Prescribe, educate and counsel regarding individualized specific dietary modifications aiming towards targeted core components such as weight, hypertension, lipid management, diabetes, heart failure and other comorbidities.    Expected Outcomes  Short Term Goal: Understand basic principles of dietary content, such as calories, fat, sodium, cholesterol and nutrients.;Long Term Goal: Adherence to prescribed nutrition plan.       Nutrition Assessments: Nutrition Assessments - 04/06/18 0749      Rate Your Plate Scores   Pre Score  42       Nutrition Goals Re-Evaluation:   Nutrition Goals Discharge (Final Nutrition Goals Re-Evaluation):   Psychosocial: Target Goals: Acknowledge presence or absence of significant depression and/or stress, maximize coping skills, provide positive support system. Participant is able to verbalize types and ability to use techniques and skills needed for reducing stress and depression.  Initial Review & Psychosocial Screening: Initial Psych Review &  Screening - 04/04/18 1438      Initial Review   Current issues with  Current Stress Concerns    Source of Stress Concerns  Family    Comments  Cares for wife who is 67 year old.  Has respite care at times      [place]?  Yes       Barriers   Psychosocial barriers to participate in program  There are no identifiable barriers or psychosocial needs.;The patient should benefit from training in stress management and relaxation.      Screening Interventions   Expected Outcomes  Long Term goal: The participant improves quality of Life and PHQ9 Scores as seen by post scores and/or verbalization of changes;Short Term goal: Identification and review with participant of any Quality of Life or Depression concerns found by scoring the questionnaire.       Quality of Life Scores:  Scores of 19 and below usually indicate a poorer quality of life in these areas.  A difference of  2-3 points is a clinically meaningful difference.  A difference of 2-3 points in the total score of the Quality of Life Index has been associated with significant improvement in overall quality of life, self-image, physical symptoms, and general health in studies assessing change in quality of life.  PHQ-9: Recent Review Flowsheet Data    Depression screen [facility] 2/9 04/04/2018 02/23/2015   Decreased Interest 0 0   Down, Depressed, Hopeless 0  0   PHQ - 2 Score 0 0   Altered sleeping 0  -   Tired, decreased energy 0 -   Change in appetite 0 -   Feeling bad or failure about yourself  0 -   Trouble concentrating 1 -   Moving slowly or fidgety/restless 1 -   Suicidal thoughts 0 -   PHQ-9 Score 2 -   Difficult doing work/chores Not difficult at all -     Interpretation of Total Score  Total Score Depression Severity:  1-4 = Minimal depression, 5-9 = Mild depression, 10-14 = Moderate depression, 15-19 = Moderately severe depression, 20-27 = Severe depression   Psychosocial Evaluation and Intervention: Psychosocial Evaluation - 05/09/18 1944      Psychosocial Evaluation & Interventions   Interventions  Stress management education    Comments  Pt has a realistic outlook on his future.  Pt participates in clinical trials in an effort to give back.  Pt  attended the support group meeting next week for Pulmonary Fibrosis    Expected Outcomes  Pt will seek community resources to assit with his wifes care.  Pt will display healthy and positive coping skills    Continue Psychosocial Services   Follow up required by staff       Psychosocial Re-Evaluation: Psychosocial Re-Evaluation    [place] Name 04/12/18 1726 04/12/18 1727 04/12/18 1728 05/09/18 1945       Psychosocial Re-Evaluation   Current issues with  None Identified  -  Current Stress Concerns  Current Stress Concerns    Comments  Pt is upbeat and positve.  Very knowledgeable about his disease process  -  wife is advanced age and requires 24/7 care  wife is advanced age and requires 24/7 care    Expected Outcomes  -  Pt will continue to possess mental well being  -  Pt will continue to possess mental well being    Interventions  Relaxation education;Stress management education;Encouraged to attend Pulmonary Rehabilitation for the  exercise  -  -  Relaxation education;Stress management education;Encouraged to attend Pulmonary Rehabilitation for the exercise    Continue Psychosocial Services   Follow up required by staff  -  -  Follow up required by staff       Psychosocial Discharge (Final Psychosocial Re-Evaluation): Psychosocial Re-Evaluation - 05/09/18 1945      Psychosocial Re-Evaluation   Current issues with  Current Stress Concerns    Comments  wife is advanced age and requires 24/7 care    Expected Outcomes  Pt will continue to possess mental well being    Interventions  Relaxation education;Stress management education;Encouraged to attend Pulmonary Rehabilitation for the exercise    Continue Psychosocial Services   Follow up required by staff       Education: Education Goals: Education classes will be provided on a weekly basis, covering required topics. Participant will state understanding/return demonstration of topics presented.  Learning Barriers/Preferences: Learning  Barriers/Preferences - 04/04/18 1441      Learning Barriers/Preferences   Learning Preferences  Computer/Internet;Group Instruction;Individual Instruction;Written Material       Education Topics: Risk Factor Reduction:  -Group instruction that is supported by a PowerPoint presentation. Instructor discusses the definition of a risk factor, different risk factors for pulmonary disease, and how the heart and lungs work together.     Nutrition for Pulmonary Patient:  -Group instruction provided by PowerPoint slides, verbal discussion, and written materials to support subject matter. The instructor gives an explanation and review of healthy diet recommendations, which includes a discussion on weight management, recommendations for fruit and vegetable consumption, as well as protein, fluid, caffeine, fiber, sodium, sugar, and alcohol. Tips for eating when patients are short of breath are discussed.   Pursed Lip Breathing:  -Group instruction that is supported by demonstration and informational handouts. Instructor discusses the benefits of pursed lip and diaphragmatic breathing and detailed demonstration on how to preform both.     Oxygen Safety:  -Group instruction provided by PowerPoint, verbal discussion, and written material to support subject matter. There is an overview of "What is Oxygen" and "Why do we need it".  Instructor also reviews how to create a safe environment for oxygen use, the importance of using oxygen as prescribed, and the risks of noncompliance. There is a brief discussion on traveling with oxygen and resources the patient may utilize.   PULMONARY REHAB OTHER RESPIRATORY from 05/03/2018 in [place]  Date  04/26/18  Educator  EP  Instruction Review Code  1- Verbalizes Understanding      Oxygen Equipment:  -Group instruction provided by [employer] utilizing handouts, written materials, and Insurance underwriter.   PULMONARY  REHAB OTHER RESPIRATORY from 05/03/2018 in [place]  Date  05/03/18  Educator  [employer]  Instruction Review Code  1- Verbalizes Understanding      Signs and Symptoms:  -Group instruction provided by written material and verbal discussion to support subject matter. Warning signs and symptoms of infection, stroke, and heart attack are reviewed and when to call the physician/911 reinforced. Tips for preventing the spread of infection discussed.   Advanced Directives:  -Group instruction provided by verbal instruction and written material to support subject matter. Instructor reviews Advanced Directive laws and proper instruction for filling out document.   Pulmonary Video:  -Group video education that reviews the importance of medication and oxygen compliance, exercise, good nutrition, pulmonary hygiene, and pursed lip and diaphragmatic breathing for the pulmonary patient.  Exercise for the Pulmonary Patient:  -Group instruction that is supported by a PowerPoint presentation. Instructor discusses benefits of exercise, core components of exercise, frequency, duration, and intensity of an exercise routine, importance of utilizing pulse oximetry during exercise, safety while exercising, and options of places to exercise outside of rehab.     Pulmonary Medications:  -Verbally interactive group education provided by instructor with focus on inhaled medications and proper administration.   Anatomy and Physiology of the Respiratory System and Intimacy:  -Group instruction provided by PowerPoint, verbal discussion, and written material to support subject matter. Instructor reviews respiratory cycle and anatomical components of the respiratory system and their functions. Instructor also reviews differences in obstructive and restrictive respiratory diseases with examples of each. Intimacy, Sex, and Sexuality differences are reviewed with a discussion on how  relationships can change when diagnosed with pulmonary disease. Common sexual concerns are reviewed.   MD DAY -A group question and answer session with a medical doctor that allows participants to ask questions that relate to their pulmonary disease state.   OTHER EDUCATION -Group or individual verbal, written, or video instructions that support the educational goals of the pulmonary rehab program.   PULMONARY REHAB OTHER RESPIRATORY from 05/03/2018 in [place]  Date  04/12/18  Educator  Cloyde Reams  Instruction Review Code  1- Verbalizes Understanding [Sedentary Lifestyle]      Holiday Eating Survival Tips:  -Group instruction provided by [employer], verbal discussion, and written materials to support subject matter. The instructor gives patients tips, tricks, and techniques to help them not only survive but enjoy the holidays despite the onslaught of food that accompanies the holidays.   Knowledge Questionnaire Score: Knowledge Questionnaire Score - 04/11/18 1519      Knowledge Questionnaire Score   Pre Score  14/18       Core Components/Risk Factors/Patient Goals at Admission: Personal Goals and Risk Factors at Admission - 04/04/18 1434      Core Components/Risk Factors/Patient Goals on Admission    Weight Management  Yes;Weight Loss    Intervention  Obesity: Provide education and appropriate resources to help participant work on and attain dietary goals.;Weight Management/Obesity: Establish reasonable short term and long term weight goals.;Weight Management: Provide education and appropriate resources to help participant work on and attain dietary goals.;Weight Management: Develop a combined nutrition and exercise program designed to reach desired caloric intake, while maintaining appropriate intake of nutrient and fiber, sodium and fats, and appropriate energy expenditure required for the weight goal.    Admit Weight  207 lb 10.8 oz (94.2 kg)     Goal Weight: Short Term  200 lb (90.7 kg)    Goal Weight: Long Term  190 lb (86.2 kg)    Expected Outcomes  Short Term: Continue to assess and modify interventions until short term weight is achieved;Weight Loss: Understanding of general recommendations for a balanced deficit meal plan, which promotes 1-2 lb weight loss per week and includes a negative energy balance of [id number] kcal/d;Understanding recommendations for meals to include 15-35% energy as protein, 25-35% energy from fat, 35-60% energy from carbohydrates, less than '200mg'$  of dietary cholesterol, 20-35 gm of total fiber daily;Understanding of distribution of calorie intake throughout the day with the consumption of 4-5 meals/snacks    Improve shortness of breath with ADL's  Yes    Intervention  Provide education, individualized exercise plan and daily activity instruction to help decrease symptoms of SOB with activities of daily living.    Expected  Outcomes  Short Term: Improve cardiorespiratory fitness to achieve a reduction of symptoms when performing ADLs;Long Term: Be able to perform more ADLs without symptoms or delay the onset of symptoms    Lipids  Yes    Intervention  Provide education and support for participant on nutrition & aerobic/resistive exercise along with prescribed medications to achieve LDL '70mg'$ , HDL >'40mg'$ .    Expected Outcomes  Short Term: Participant states understanding of desired cholesterol values and is compliant with medications prescribed. Participant is following exercise prescription and nutrition guidelines.;Long Term: Cholesterol controlled with medications as prescribed, with individualized exercise RX and with personalized nutrition plan. Value goals: LDL < '70mg'$ , HDL > 40 mg.    Stress  Yes    Intervention  Offer individual and/or small group education and counseling on adjustment to heart disease, stress management and health-related lifestyle change. Teach and support self-help strategies.    Expected  Outcomes  Short Term: Participant demonstrates changes in health-related behavior, relaxation and other stress management skills, ability to obtain effective social support, and compliance with psychotropic medications if prescribed.;Long Term: Emotional wellbeing is indicated by absence of clinically significant psychosocial distress or social isolation.   cares for 71 year old wife      Core Components/Risk Factors/Patient Goals Review:  Goals and Risk Factor Review    Row Name 04/12/18 1728 05/09/18 1945           Core Components/Risk Factors/Patient Goals Review   Personal Goals Review  Weight Management/Obesity;Develop more efficient breathing techniques such as purse lipped breathing and diaphragmatic breathing and practicing self-pacing with activity.;Stress;Increase knowledge of respiratory medications and ability to use respiratory devices properly.;Improve shortness of breath with ADL's  Weight Management/Obesity;Develop more efficient breathing techniques such as purse lipped breathing and diaphragmatic breathing and practicing self-pacing with activity.;Stress;Increase knowledge of respiratory medications and ability to use respiratory devices properly.;Improve shortness of breath with ADL's      Review  Pt completed 1 exercise session.  Unable to assess pt progress.  Anticipate pt will show measurable progress within the next 30 day assessment  Pt completed 7 exercise sessions and 3 education classes.  Pt understands the importance of using his oxygen but has not yet started to use this at home.  Pt places it on for exercise only despite having to increase his oxygen therapy to 3L while ambulating. continue to encourage pt and remind him of the importance of maintaining oxygen saturation.  Pt weight is down 2.6 kg since starting on 9/19/  Pt rates 2 for dypnea.  Needs some verbal reminders for PLB. Pt workloads show increase to level 3 on nustep, averages 15 laps on the track and 06 on the  airydyne. Pt stress level remains the same.  Pt wife health requires nearly constant supervision  by pt  Anticipate pt will show measurable progress within the next 30 day assessment      Expected Outcomes  See Admission Goals/Outcomes  See Admission Goals/Outcomes         Core Components/Risk Factors/Patient Goals at Discharge (Final Review):  Goals and Risk Factor Review - 05/09/18 1945      Core Components/Risk Factors/Patient Goals Review   Personal Goals Review  Weight Management/Obesity;Develop more efficient breathing techniques such as purse lipped breathing and diaphragmatic breathing and practicing self-pacing with activity.;Stress;Increase knowledge of respiratory medications and ability to use respiratory devices properly.;Improve shortness of breath with ADL's    Review  Pt completed 7 exercise sessions and 3 education classes.  Pt understands the importance of using his oxygen but has not yet started to use this at home.  Pt places it on for exercise only despite having to increase his oxygen therapy to 3L while ambulating. continue to encourage pt and remind him of the importance of maintaining oxygen saturation.  Pt weight is down 2.6 kg since starting on 9/19/  Pt rates 2 for dypnea.  Needs some verbal reminders for PLB. Pt workloads show increase to level 3 on nustep, averages 15 laps on the track and 06 on the airydyne. Pt stress level remains the same.  Pt wife health requires nearly constant supervision  by pt  Anticipate pt will show measurable progress within the next 30 day assessment    Expected Outcomes  See Admission Goals/Outcomes       ITP Comments: ITP Comments    Row Name 04/04/18 1401 04/12/18 1725 04/12/18 1730 05/09/18 1944     ITP Comments  Dr, Manfred Arch, Medical Director Pulmonary Rehab  Dr, Manfred Arch, Medical Director Pulmonary Rehab  Dr, Manfred Arch, Medical Director Pulmonary Rehab  Dr, Manfred Arch, Medical Director Pulmonary Rehab        Comments: Pt has completed 7 exercise sessions. Continue to montior. Cherre Huger, BSN Cardiac and Training and development officer

## 2018-05-10 ENCOUNTER — Encounter (HOSPITAL_COMMUNITY)
Admission: RE | Admit: 2018-05-10 | Discharge: 2018-05-10 | Disposition: A | Discharge status: Home / Self Care | Payer: Medicare Other | Source: Ambulatory Visit | Attending: Internal Medicine | Admitting: Internal Medicine

## 2018-05-10 DIAGNOSIS — J84112 Idiopathic pulmonary fibrosis: Secondary | ICD-10-CM | POA: Diagnosis not present

## 2018-05-10 NOTE — Progress Notes (Signed)
Daily Session Note  Patient Details  Name: Ian Hale MRN: [id number] Date of Birth: 1934-11-13 Referring Provider:     Pulmonary Rehab Walk Test from 04/05/2018 in [place]  Referring Provider  Dr. Elsworth Soho      Encounter Date: 05/10/2018  Check In: Session Check In - 05/10/18 1417      Check-In   Supervising physician immediately available to respond to emergencies  [facility] immediately available    Physician(s)  Dr. Ree Kida    Location  [facility]    Staff Present  Hoy Register, MS, Exercise Physiologist;Kenitha Glendinning Ysidro Evert, Felipe Drone, RN, MHA;Molly DiVincenzo, MS, ACSM RCEP, Exercise Physiologist    Medication changes reported      No    Fall or balance concerns reported     No    Tobacco Cessation  No Change    Warm-up and Cool-down  Performed as group-led instruction    Resistance Training Performed  Yes    VAD Patient?  No    PAD/SET Patient?  No      Pain Assessment   Currently in Pain?  No/denies    Multiple Pain Sites  No       Capillary Blood Glucose: No results found for this or any previous visit (from the past 24 hour(s)).    Social History   Tobacco Use  Smoking Status Never Smoker  Smokeless Tobacco Never Used    Goals Met:  Exercise tolerated well No report of cardiac concerns or symptoms Strength training completed today  Goals Unmet:  Not Applicable  Comments: Service time is from 1330 to 1505    Dr. Rush Farmer is Medical Director for Pulmonary Rehab at [facility].

## 2018-05-15 ENCOUNTER — Encounter (HOSPITAL_COMMUNITY)
Admission: RE | Admit: 2018-05-15 | Discharge: 2018-05-15 | Disposition: A | Discharge status: Home / Self Care | Payer: Medicare Other | Source: Ambulatory Visit | Attending: Internal Medicine | Admitting: Internal Medicine

## 2018-05-15 DIAGNOSIS — J84112 Idiopathic pulmonary fibrosis: Secondary | ICD-10-CM

## 2018-05-15 NOTE — Progress Notes (Signed)
Daily Session Note  Patient Details  Name: Ian Hale MRN: [id number] Date of Birth: 1935/01/21 Referring Provider:     Pulmonary Rehab Walk Test from 04/05/2018 in [place]  Referring Provider  Dr. Elsworth Soho      Encounter Date: 05/15/2018  Check In: Session Check In - 05/15/18 1321      Check-In   Supervising physician immediately available to respond to emergencies  [facility] immediately available    Physician(s)  Dr. Ree Kida    Location  [facility]    Staff Present  Hoy Register, MS, Exercise Physiologist;Lisa Ysidro Evert, Felipe Drone, RN, MHA;Molly DiVincenzo, MS, ACSM RCEP, Exercise Physiologist;Carlette Wilber Oliphant, RN, BSN    Medication changes reported      No    Fall or balance concerns reported     No    Tobacco Cessation  No Change    Warm-up and Cool-down  Performed as group-led instruction    Resistance Training Performed  Yes    VAD Patient?  No    PAD/SET Patient?  No      Pain Assessment   Currently in Pain?  No/denies    Multiple Pain Sites  No       Capillary Blood Glucose: No results found for this or any previous visit (from the past 24 hour(s)).    Social History   Tobacco Use  Smoking Status Never Smoker  Smokeless Tobacco Never Used    Goals Met:  Proper associated with RPD/PD & O2 Sat Exercise tolerated well  Goals Unmet:  Not Applicable  Comments: Service time is from 1330 to 1500    Dr. Rush Farmer is Medical Director for Pulmonary Rehab at [facility].

## 2018-05-17 ENCOUNTER — Encounter (HOSPITAL_COMMUNITY)
Admission: RE | Admit: 2018-05-17 | Discharge: 2018-05-17 | Disposition: A | Discharge status: Home / Self Care | Payer: Medicare Other | Source: Ambulatory Visit | Attending: Internal Medicine | Admitting: Internal Medicine

## 2018-05-17 ENCOUNTER — Other Ambulatory Visit: Payer: Self-pay | Admitting: Cardiovascular Disease

## 2018-05-17 DIAGNOSIS — J84112 Idiopathic pulmonary fibrosis: Secondary | ICD-10-CM | POA: Diagnosis not present

## 2018-05-17 NOTE — Progress Notes (Signed)
Daily Session Note  Patient Details  Name: Ian Hale MRN: [id number] Date of Birth: [birth year] Referring Provider:     Pulmonary Rehab Walk Test from 04/05/2018 in [place]  Referring Provider  Dr. Elsworth Soho      Encounter Date: 05/17/2018  Check In: Session Check In - 05/17/18 1330      Check-In   Supervising physician immediately available to respond to emergencies  [facility] immediately available    Physician(s)  Dr. Florene Glen    Location  [facility]    Staff Present  [place] Poles, RN, BSN;Carlette Wilber Oliphant, RN, BSN;Molly DiVincenzo, MS, ACSM RCEP, Exercise Physiologist;Dalton Kris Mouton, MS, Exercise Physiologist;Lisa Colletta [place], RN, MHA    Medication changes reported      No    Fall or balance concerns reported     No    Tobacco Cessation  No Change    Warm-up and Cool-down  Performed as group-led instruction    Resistance Training Performed  Yes    VAD Patient?  No    PAD/SET Patient?  No      Pain Assessment   Currently in Pain?  No/denies    Multiple Pain Sites  No       Capillary Blood Glucose: No results found for this or any previous visit (from the past 24 hour(s)).    Social History   Tobacco Use  Smoking Status Never Smoker  Smokeless Tobacco Never Used    Goals Met:  Proper associated with RPD/PD & O2 Sat Exercise tolerated well  Goals Unmet:  Not Applicable  Comments: Service time is from 1330 to 1520    Dr. Rush Farmer is Medical Director for Pulmonary Rehab at [facility].

## 2018-05-17 NOTE — Progress Notes (Signed)
While exercising at Pulmonary Rehab, [employer] consistently uses 2-3 liters of continuous flow oxygen. This is provided by Pulmonary Rehab [employer]. At home, the patient currently uses 2 liters for their home oxygen system. While exercising at home, the patient was instructed to use 2-3 liters. In Pulmonary Rehab today, Vannie walked the track utilizing their own home oxygen system. They used 5 liters Pulsed which maintained their oxygen saturation above 85%.

## 2018-05-22 ENCOUNTER — Encounter (HOSPITAL_COMMUNITY)
Admission: RE | Admit: 2018-05-22 | Discharge: 2018-05-22 | Disposition: A | Discharge status: Home / Self Care | Payer: Medicare Other | Source: Ambulatory Visit | Attending: Internal Medicine | Admitting: Internal Medicine

## 2018-05-22 VITALS — Wt 205.0 lb

## 2018-05-22 DIAGNOSIS — J84112 Idiopathic pulmonary fibrosis: Secondary | ICD-10-CM

## 2018-05-22 NOTE — Progress Notes (Signed)
Daily Session Note  Patient Details  Name: ODEN LINDAMAN MRN: [id number] Date of Birth: [birth year] Referring Provider:     Pulmonary Rehab Walk Test from 04/05/2018 in [place]  Referring Provider  Dr. Elsworth Soho      Encounter Date: 05/22/2018  Check In: Session Check In - 05/22/18 1330      Check-In   Supervising physician immediately available to respond to emergencies  [facility] immediately available    Physician(s)  Dr. Florene Glen    Location  [facility]    Staff Present  [place] Poles, RN, BSN;Carlette Wilber Oliphant, RN, BSN;Molly DiVincenzo, MS, ACSM RCEP, Exercise Physiologist;Dalton Kris Mouton, MS, Exercise Physiologist;Lisa Colletta [place], RN, MHA    Medication changes reported      No    Fall or balance concerns reported     No    Tobacco Cessation  No Change    Warm-up and Cool-down  Performed as group-led instruction    Resistance Training Performed  Yes    VAD Patient?  No    PAD/SET Patient?  No      Pain Assessment   Currently in Pain?  No/denies    Multiple Pain Sites  No       Capillary Blood Glucose: No results found for this or any previous visit (from the past 24 hour(s)).  Exercise Prescription Changes - 05/22/18 1500      Response to Exercise   Blood Pressure (Admit)  134/60    Blood Pressure (Exercise)  138/60    Blood Pressure (Exit)  120/60    Heart Rate (Admit)  57 bpm    Heart Rate (Exercise)  78 bpm    Heart Rate (Exit)  62 bpm    Oxygen Saturation (Admit)  97 %    Oxygen Saturation (Exercise)  93 %    Oxygen Saturation (Exit)  95 %    Rating of Perceived Exertion (Exercise)  13    Perceived Dyspnea (Exercise)  2    Duration  Progress to 45 minutes of aerobic exercise without signs/symptoms of physical distress    Intensity  THRR unchanged      Progression   Progression  Continue to progress workloads to maintain intensity without signs/symptoms of physical distress.      Resistance Training   Training Prescription  Yes    Weight  blue bands    Reps  10-15    Time  10 Minutes      Oxygen   Oxygen  Continuous    Liters  2-3      NuStep   Level  3    SPM  80    Minutes  17    METs  2.1      Track   Laps  15    Minutes  17       Social History   Tobacco Use  Smoking Status Never Smoker  Smokeless Tobacco Never Used    Goals Met:  Proper associated with RPD/PD & O2 Sat Exercise tolerated well  Goals Unmet:  Not Applicable  Comments: Service time is from 1330 to 1500    Dr. Rush Farmer is Medical Director for Pulmonary Rehab at [facility].

## 2018-05-22 NOTE — Progress Notes (Signed)
I have reviewed a Home Exercise Prescription with Ian Hale . [name] is not currently exercising at home.  The patient was advised to walk 1-2 days a week for 15-30 minutes.  Ian Hale and I discussed how to progress their exercise prescription.  The patient stated that their goals were to lose weight.  The patient stated that they understand the exercise prescription.  We reviewed exercise guidelines, target heart rate during exercise, RPE Scale, weather conditions, NTG use, endpoints for exercise, warmup and cool down.  Patient is encouraged to come to me with any questions. I will continue to follow up with the patient to assist them with progression and safety.

## 2018-05-24 ENCOUNTER — Encounter (HOSPITAL_COMMUNITY)
Admission: RE | Admit: 2018-05-24 | Discharge: 2018-05-24 | Disposition: A | Discharge status: Home / Self Care | Payer: Medicare Other | Source: Ambulatory Visit | Attending: Internal Medicine | Admitting: Internal Medicine

## 2018-05-24 DIAGNOSIS — J84112 Idiopathic pulmonary fibrosis: Secondary | ICD-10-CM

## 2018-05-24 NOTE — Progress Notes (Signed)
Pt walked around track with POC that he will be using at home during exercise. At 3L pulsed, his SaO2 dropped to 85, so I increased it to 5L. Patient could make several laps around the track before his SaO2 would drop below 88. I expressed the importance of him stopping to take breaks and wear his pulse ox at all times while exercising. Pt expresses understanding and will stop exercise if his SaO2 drops below 88 and wait for it to rise again.

## 2018-05-24 NOTE — Progress Notes (Signed)
Daily Session Note  Patient Details  Name: Ian Hale MRN: [id number] Date of Birth: [birth year] Referring Provider:     Pulmonary Rehab Walk Test from 04/05/2018 in [place]  Referring Provider  Ian Hale      Encounter Date: 05/24/2018  Check In: Session Check In - 05/24/18 1330      Check-In   Supervising physician immediately available to respond to emergencies  [facility] immediately available    Physician(s)  Dr. [place] Pink    Location  [facility]    Staff Present  [place] Poles, RN, BSN;Carlette Wilber Oliphant, RN, BSN;Molly DiVincenzo, MS, ACSM RCEP, Exercise Physiologist;Dalton Kris Mouton, MS, Exercise Physiologist;Lisa Colletta [place], RN, MHA    Medication changes reported      No    Fall or balance concerns reported     No    Tobacco Cessation  No Change    Warm-up and Cool-down  Performed as group-led instruction    Resistance Training Performed  Yes    VAD Patient?  No    PAD/SET Patient?  No      Pain Assessment   Currently in Pain?  No/denies    Multiple Pain Sites  No       Capillary Blood Glucose: No results found for this or any previous visit (from the past 24 hour(s)).    Social History   Tobacco Use  Smoking Status Never Smoker  Smokeless Tobacco Never Used    Goals Met:  Proper associated with RPD/PD & O2 Sat Exercise tolerated well  Goals Unmet:  Not Applicable  Comments: Service time is from 1330 to 1520    Dr. Rush Farmer is Medical Director for Pulmonary Rehab at [facility].

## 2018-05-29 ENCOUNTER — Encounter (HOSPITAL_COMMUNITY)
Admission: RE | Admit: 2018-05-29 | Discharge: 2018-05-29 | Disposition: A | Discharge status: Home / Self Care | Payer: Medicare Other | Source: Ambulatory Visit | Attending: Internal Medicine | Admitting: Internal Medicine

## 2018-05-29 DIAGNOSIS — I2510 Atherosclerotic heart disease of native coronary artery without angina pectoris: Secondary | ICD-10-CM | POA: Diagnosis not present

## 2018-05-29 DIAGNOSIS — J84112 Idiopathic pulmonary fibrosis: Secondary | ICD-10-CM | POA: Diagnosis not present

## 2018-05-29 DIAGNOSIS — N400 Enlarged prostate without lower urinary tract symptoms: Secondary | ICD-10-CM | POA: Insufficient documentation

## 2018-05-29 DIAGNOSIS — E785 Hyperlipidemia, unspecified: Secondary | ICD-10-CM | POA: Diagnosis not present

## 2018-05-29 DIAGNOSIS — Z79899 Other long term (current) drug therapy: Secondary | ICD-10-CM | POA: Diagnosis not present

## 2018-05-29 DIAGNOSIS — R918 Other nonspecific abnormal finding of lung field: Secondary | ICD-10-CM | POA: Diagnosis not present

## 2018-05-29 DIAGNOSIS — Z7982 Long term (current) use of aspirin: Secondary | ICD-10-CM | POA: Diagnosis not present

## 2018-05-29 DIAGNOSIS — I10 Essential (primary) hypertension: Secondary | ICD-10-CM | POA: Insufficient documentation

## 2018-05-29 DIAGNOSIS — R942 Abnormal results of pulmonary function studies: Secondary | ICD-10-CM | POA: Diagnosis not present

## 2018-05-29 NOTE — Progress Notes (Signed)
Daily Session Note  Patient Details  Name: Ian Hale MRN: [id number] Date of Birth: Jul 04, 1935 Referring Provider:     Pulmonary Rehab Walk Test from 04/05/2018 in [place]  Referring Provider  Dr. Elsworth Soho      Encounter Date: 05/29/2018  Check In: Session Check In - 05/29/18 1330      Check-In   Supervising physician immediately available to respond to emergencies  [facility] immediately available    Physician(s)  Dr. [place] Pink    Location  [facility]    Staff Present  [place] Poles, RN, BSN;Carlette Wilber Oliphant, RN, Bjorn Loser, MS, Exercise Physiologist;Annedrea Rosezella [place], RN, MHA    Medication changes reported      No    Fall or balance concerns reported     No    Tobacco Cessation  No Change    Warm-up and Cool-down  Performed as group-led instruction    Resistance Training Performed  Yes    VAD Patient?  No    PAD/SET Patient?  No      Pain Assessment   Currently in Pain?  No/denies    Multiple Pain Sites  No       Capillary Blood Glucose: No results found for this or any previous visit (from the past 24 hour(s)).    Social History   Tobacco Use  Smoking Status Never Smoker  Smokeless Tobacco Never Used    Goals Met:  Proper associated with RPD/PD & O2 Sat Exercise tolerated well  Goals Unmet:  Not Applicable  Comments: Service time is from 1330 to 1500    Dr. Rush Farmer is Medical Director for Pulmonary Rehab at [facility].

## 2018-05-30 ENCOUNTER — Encounter: Payer: Medicare Other | Admitting: *Deleted

## 2018-05-30 ENCOUNTER — Encounter (INDEPENDENT_AMBULATORY_CARE_PROVIDER_SITE_OTHER): Payer: Medicare Other | Admitting: Internal Medicine

## 2018-05-30 DIAGNOSIS — Z006 Encounter for examination for normal comparison and control in clinical research program: Secondary | ICD-10-CM

## 2018-05-30 DIAGNOSIS — J84112 Idiopathic pulmonary fibrosis: Secondary | ICD-10-CM

## 2018-05-30 NOTE — Patient Instructions (Addendum)
ICD-10-CM   1. Research exam Z00.6   2. IPF (idiopathic pulmonary fibrosis) (HCC) J84.112     Per protocol 

## 2018-05-30 NOTE — Research (Signed)
Title: GALACTIC-1 [facility]) is a Phase 2b, randomized, double-blind, parallel, placebo-controlled multicenter international study to evaluate evaluate the efficacy and safety of two doses (placebo v 3mg  v 10mg ) of TD139 administered once a day for 52 weeks as compared to placebo in subjects with Idiopathic Pulmonary Fibrosis. Key Primary End Point: Annual rate of decline in FVC expressed in mL over 52 weeks  Protocol #: GALACTIC-1, Clinical Trials #: [id number]. Sponsor: [email or web address], [place])  Astronomer / Radio producer note : This visit for Subject Ian Hale with DOB: 1935-05-06 on 05/30/2018 for the above protocol is Visit/Encounter # 3 and is for purpose of research . Subject expressed continued interest and consent in continuing as a study subject. Subject confirmed that there was no change in contact information (e.g. address, telephone, email). Subject thanked for participation in research and contribution to science.   In this visit 05/30/2018 the subject will be evaluated by investigator named Dr. Marchelle Gearing  . This research coordinator has verified that the investigator is uptodate with his/her training logs   Subject arrived at 0900. At this time Review of the updated consent form began. Every change to the ICF was discussed by this CRC with the subject. The subject was given ample time to review the changes and ask any questions. Subject stated understanding of all changes and verbalized the intent to re=consent to continue participation in the trial.  However while reviewing the copy of the ICF with the subject, this CRC noticed a few errors in the sections where references to tables should be. The Regulatory coordinator was not immediately available to make the corrections needed and to re-print a corrected copy. The PI, Dr. Marchelle Gearing, was consulted during the re-consenting process, and the decision was made to proceed with visit assessments due to visit  timing and collect the subjects signature once a clean copy was printed. Subject was able to sign the amended version of the optional pharmacogenetic ICF at 09:25.   A clean copy of the amended Main ICF was provided and the subject signed the copy at 10:24. Refer to the subjects paper source binder for further documentation of the consent process.   All assessments were completed per the above mentioned protocol. Refer to the subjects paper source binder for further details of the visit.       Signed by Carron Curie, [facility]  Clinical Research Coordinator  [employer]  [place], [place] 4:51 PM 05/30/2018

## 2018-05-30 NOTE — Progress Notes (Signed)
Title: GALACTIC-1 [facility]) is a Phase 2b, randomized, double-blind, parallel, placebo-controlled multicenter international study to evaluate evaluate the efficacy and safety of two doses (placebo v 49m v 174m of TD139 administered once a day for 52 weeks as compared to placebo in subjects with Idiopathic Pulmonary Fibrosis. Key Primary End Point: Annual rate of decline in FVC expressed in mL over 52 weeks  Protocol #: GALACTIC-1, Clinical Trials #: [id number]: [email or web address] ([employer] [place] Protocol Version V4.8 US_dated [phone] Version 8.0 dated 07Dec2018 [place] ICF Version Number: 6.1.0; Date: 23-Feb-2018 Local Site Version 2.0 dated 27 Apr 2018  Key Features of TD139 the study drug: a Galectin-3 inhibitor designed specifically to modulate the fibrogenic response to tissue injury. In multiple models of organ fibrosis, it has been demonstrated that Gal-3 is potently pro-fibrotic, modulating the activity of fibroblasts and macrophages in chronically injured tissues. It is hypothesized that increased Gal-3 expression during chronic injury leads to fibroblast activation via the aggregation of growth factor receptor clusters (such as TGF-), thereby amplifying profibrotic signal cascades.  ........................................................................... This visit for Subject [name] DOB: 08/1934-12-17n 05/30/2018 for the above protocol is Visit #3  and is for purpose of reserch. Currently taking IP . Subject/LAR expressed continued interest and consent in continuing as a study subject. Subject thanked for participation in research and contribution to science.      S: We obtained consent under the revised version.  When he came for the consent part of the visit after the changes were explained we realize there was administrative glitch in the printing.  The regulatory coordinator was not immediately available to reprint a revised version which the main  part is about getting HRCT under research.  Given his schedule of appointments we had to proceed with the visit although he was fully consented [except for signature] under the revised version. We decided that this decision was not going to compromise safety or integrity or any coercion.   He understood this.  We fully disclosed this to him.  I met with him.  He expressed understanding of the revised version.  He expressed continued interest in the recent study.  He told me that overall he is stable.  He can feel the inhaler in his mouth sometimes when he inhales it.  Otherwise no new issues.  No adverse events.  Compliant with his nintedanib standard of care.  No GI issues.   Exam -recent exam done and documented in the paper source document   A/P    ICD-10-CM   1. Research exam Z00.6   2. IPF (idiopathic pulmonary fibrosis) ([id number]     P -He will go to rest of the study visits including lab draw EKG and spirometry today     SIGNATURE    Dr. [name], [name], [name] Pulmonary and CrBear CreekPulmonIx @ [facility] Physician, Co[place] [place] - Interstitial Lung Disease  Program  Pulmonary FiStoney Pointulmonary and PulmonIx @ [id number]: [phone] no answer or between  15:00h - 7:00h: call [phone] Telephone: [phone]  10:24 AM 05/30/2018

## 2018-05-31 ENCOUNTER — Encounter (HOSPITAL_COMMUNITY)
Admission: RE | Admit: 2018-05-31 | Discharge: 2018-05-31 | Disposition: A | Discharge status: Home / Self Care | Payer: Medicare Other | Source: Ambulatory Visit | Attending: Internal Medicine | Admitting: Internal Medicine

## 2018-05-31 DIAGNOSIS — J84112 Idiopathic pulmonary fibrosis: Secondary | ICD-10-CM | POA: Diagnosis not present

## 2018-05-31 NOTE — Progress Notes (Signed)
Daily Session Note  Patient Details  Name: Ian Hale MRN: [id number] Date of Birth: 1934-10-15 Referring Provider:     Pulmonary Rehab Walk Test from 04/05/2018 in [place]  Referring Provider  Dr. Elsworth Soho      Encounter Date: 05/31/2018  Check In: Session Check In - 05/31/18 1330      Check-In   Supervising physician immediately available to respond to emergencies  [facility] immediately available    Physician(s)  Dr. Starla Link    Location  [facility]    Staff Present  Rosebud Poles, RN, BSN;Carlette Wilber Oliphant, RN, Bjorn Loser, MS, Exercise Physiologist;Annedrea Rosezella [place], RN, MHA    Medication changes reported      No    Fall or balance concerns reported     No    Tobacco Cessation  No Change    Warm-up and Cool-down  Performed as group-led instruction    Resistance Training Performed  Yes    VAD Patient?  No    PAD/SET Patient?  No      Pain Assessment   Currently in Pain?  No/denies    Multiple Pain Sites  No       Capillary Blood Glucose: No results found for this or any previous visit (from the past 24 hour(s)).    Social History   Tobacco Use  Smoking Status Never Smoker  Smokeless Tobacco Never Used    Goals Met:  Proper associated with RPD/PD & O2 Sat Exercise tolerated well  Goals Unmet:  Not Applicable  Comments: Service time is from 1330 to [place]    Dr. Rush Farmer is Medical Director for Pulmonary Rehab at [facility].

## 2018-06-05 ENCOUNTER — Encounter (HOSPITAL_COMMUNITY)
Admission: RE | Admit: 2018-06-05 | Discharge: 2018-06-05 | Disposition: A | Discharge status: Home / Self Care | Payer: Medicare Other | Source: Ambulatory Visit | Attending: Internal Medicine | Admitting: Internal Medicine

## 2018-06-05 VITALS — Wt 205.0 lb

## 2018-06-05 DIAGNOSIS — J84112 Idiopathic pulmonary fibrosis: Secondary | ICD-10-CM

## 2018-06-05 NOTE — Progress Notes (Signed)
Daily Session Note  Patient Details  Name: CORBYN WILDEY MRN: [id number] Date of Birth: 03/26/1935 Referring Provider:     Pulmonary Rehab Walk Test from 04/05/2018 in [place]  Referring Provider  Dr. Elsworth Soho      Encounter Date: 06/05/2018  Check In: Session Check In - 06/05/18 1330      Check-In   Supervising physician immediately available to respond to emergencies  [facility] immediately available    Physician(s)  Dr. Starla Link    Location  [facility]    Staff Present  Rosebud Poles, RN, BSN;Carlette Wilber Oliphant, RN, Bjorn Loser, MS, Exercise Physiologist;Jadelynn Boylan Ysidro Evert, Felipe Drone, RN, MHA    Medication changes reported      No    Fall or balance concerns reported     No    Tobacco Cessation  No Change    Warm-up and Cool-down  Performed as group-led instruction    Resistance Training Performed  Yes    VAD Patient?  No    PAD/SET Patient?  No      Pain Assessment   Currently in Pain?  No/denies    Multiple Pain Sites  No       Capillary Blood Glucose: No results found for this or any previous visit (from the past 24 hour(s)).  Exercise Prescription Changes - 06/05/18 1500      Response to Exercise   Blood Pressure (Admit)  130/62    Blood Pressure (Exercise)  116/64    Blood Pressure (Exit)  100/60    Heart Rate (Admit)  61 bpm    Heart Rate (Exercise)  99 bpm    Heart Rate (Exit)  66 bpm    Oxygen Saturation (Admit)  95 %    Oxygen Saturation (Exercise)  88 %    Oxygen Saturation (Exit)  93 %    Rating of Perceived Exertion (Exercise)  14    Perceived Dyspnea (Exercise)  3    Duration  Continue with 45 min of aerobic exercise without signs/symptoms of physical distress.    Intensity  THRR unchanged      Progression   Progression  Continue to progress workloads to maintain intensity without signs/symptoms of physical distress.      Resistance Training   Training Prescription  Yes    Weight   blue bands    Reps  10-15    Time  10 Minutes      Oxygen   Oxygen  Continuous    Liters  2-3      Bike   Level  0.6    Minutes  17      NuStep   Level  4    SPM  80    Minutes  17    METs  2      Track   Laps  17    Minutes  17       Social History   Tobacco Use  Smoking Status Never Smoker  Smokeless Tobacco Never Used    Goals Met:  Exercise tolerated well No report of cardiac concerns or symptoms Strength training completed today  Goals Unmet:  Not Applicable  Comments: Service time is from 1330 to 1510    Dr. Rush Farmer is Medical Director for Pulmonary Rehab at [facility].

## 2018-06-06 NOTE — Progress Notes (Signed)
Pulmonary Individual Treatment Plan  Patient Details  Name: Ian Hale MRN: [id number] Date of Birth: 05-01-1935 Referring Provider:     Pulmonary Rehab Walk Test from 04/05/2018 in [facility]  Referring Provider  Dr. Elsworth Soho      Initial Encounter Date:    Pulmonary Rehab Walk Test from 04/05/2018 in [facility]  Date  04/05/18      Visit Diagnosis: Idiopathic pulmonary fibrosis ([place])  Patient's Home Medications on Admission:   Current Outpatient Medications:  .  aspirin 81 MG tablet, Take 81 mg by mouth daily.  , Disp: , Rfl:  .  atorvastatin (LIPITOR) 80 MG tablet, TAKE 1 TABLET(80 MG) BY MOUTH DAILY, Disp: 90 tablet, Rfl: 0 .  bimatoprost (LUMIGAN) 0.01 % SOLN, Place 1 drop into both eyes at bedtime., Disp: , Rfl:  .  cyanocobalamin 2000 MCG tablet, Take 2,000 mcg by mouth 2 (two) times daily., Disp: , Rfl:  .  furosemide (LASIX) 20 MG tablet, TAKE 1 TABLET(20 MG) BY MOUTH DAILY, Disp: 90 tablet, Rfl: 3 .  metoprolol tartrate (LOPRESSOR) 25 MG tablet, TAKE 1/2 TABLET(12.5 MG) BY MOUTH TWICE DAILY, Disp: 90 tablet, Rfl: 3 .  nitroGLYCERIN (NITROSTAT) 0.4 MG SL tablet, Place 1 tablet (0.4 mg total) under the tongue every 5 (five) minutes as needed., Disp: 25 tablet, Rfl: 1 .  OFEV 150 MG CAPS, , Disp: , Rfl:  .  Omega-3 Fatty Acids (FISH OIL PO), Take 2 capsules by mouth. Pt takes 4011m, Disp: , Rfl:  .  potassium chloride SA (K-DUR,KLOR-CON) 20 MEQ tablet, Take 0.5 tablets (10 mEq total) by mouth daily., Disp: 45 tablet, Rfl: 2 .  saw palmetto 160 MG capsule, Take 160 mg by mouth daily. , Disp: , Rfl:   Past Medical History: Past Medical History:  Diagnosis Date  . Allergy   . BPH (benign prostatic hyperplasia)   . CAD (coronary artery disease)   . Cholesteatoma of left ear   . ED (erectile dysfunction)   . Glaucoma   . Glaucoma   . Hearing loss of both ears   . Hyperlipidemia   . Hypertension     Tobacco  Use: Social History   Tobacco Use  Smoking Status Never Smoker  Smokeless Tobacco Never Used    Labs: Recent Review Flowsheet Data    Labs for ITP Cardiac and Pulmonary Rehab Latest Ref Rng & Units 09/27/2011 10/09/2012 10/29/2013 02/23/2015 01/01/2016   Cholestrol 125 - 200 mg/dL 126 120 123 127 120(L)   LDLCALC <130 mg/dL 73 75 75 65 64   HDL >=40 mg/dL 33.10(L) 29.00(L) 32.60(L) 38.60(L) 34(L)   Trlycerides <150 mg/dL 102.0 80.0 77.0 116.0 108      Capillary Blood Glucose: No results found for: GLUCAP   Pulmonary Assessment Scores: Pulmonary Assessment Scores    Row Name 04/05/18 1628 04/11/18 1520       ADL UCSD   ADL Phase  Entry  Entry    SOB Score total  -  68      CAT Score   CAT Score  -  20      mMRC Score   mMRC Score  1  -       Pulmonary Function Assessment:   Exercise Target Goals: Exercise Program Goal: Individual exercise prescription set using results from initial 6 min walk test and THRR while considering  patient's activity barriers and safety.   Exercise Prescription Goal: Initial exercise prescription builds  to 30-45 minutes a day of aerobic activity, 2-3 days per week.  Home exercise guidelines will be given to patient during program as part of exercise prescription that the participant will acknowledge.  Activity Barriers & Risk Stratification:   6 Minute Walk: 6 Minute Walk    Row Name 04/05/18 1628         6 Minute Walk   Phase  Initial     Distance  900 feet     Walk Time  6 minutes     # of Rest Breaks  0     MPH  1.7     METS  2.3     RPE  12     Perceived Dyspnea   1     Resting HR  62 bpm     Resting BP  144/64     Resting Oxygen Saturation   92 %     Exercise Oxygen Saturation  during 6 min walk  84 %     Max Ex. HR  73 bpm     Max Ex. BP  140/62       Interval HR   1 Minute HR  71     2 Minute HR  92     3 Minute HR  73     4 Minute HR  73     5 Minute HR  71     6 Minute HR  71     Interval Heart Rate?  Yes         Interval Oxygen   Interval Oxygen?  Yes     Baseline Oxygen Saturation %  92 %     1 Minute Oxygen Saturation %  91 %     1 Minute Liters of Oxygen  0 L     2 Minute Oxygen Saturation %  90 %     2 Minute Liters of Oxygen  0 L     3 Minute Oxygen Saturation %  85 %     3 Minute Liters of Oxygen  0 L     4 Minute Oxygen Saturation %  86 %     4 Minute Liters of Oxygen  0 L     5 Minute Oxygen Saturation %  84 %     5 Minute Liters of Oxygen  0 L     6 Minute Oxygen Saturation %  84 %     6 Minute Liters of Oxygen  0 L        Oxygen Initial Assessment: Oxygen Initial Assessment - 04/05/18 1627      Initial 6 min Walk   Oxygen Used  None      Program Oxygen Prescription   Program Oxygen Prescription  E-Tanks;Continuous    Comments  Patient will need oxygen during Pulmonary Rehab. Patient desaturated to 84% during 6MWT. Will conduct titration first day of exercise.        Oxygen Re-Evaluation: Oxygen Re-Evaluation    Row Name 05/08/18 1649 06/06/18 0924           Program Oxygen Prescription   Program Oxygen Prescription  E-Tanks;Continuous  E-Tanks;Continuous      Liters per minute  - 2-3  3      Comments  -  3 L on track, 2 L during seated exercise         Home Oxygen   Home Oxygen Device  Home Concentrator Patient is waiting to POC delivery  Home Concentrator;Portable  Concentrator      Sleep Oxygen Prescription  None  None      Home Exercise Oxygen Prescription  None  Pulsed      Liters per minute  -  3      Home at Rest Exercise Oxygen Prescription  None  None      Compliance with Home Oxygen Use  No  Yes        Goals/Expected Outcomes   Short Term Goals  To learn and exhibit compliance with exercise, home and travel O2 prescription;To learn and understand importance of maintaining oxygen saturations>88%;To learn and demonstrate proper use of respiratory medications;To learn and understand importance of monitoring SPO2 with pulse oximeter and demonstrate accurate  use of the pulse oximeter.;To learn and demonstrate proper pursed lip breathing techniques or other breathing techniques.  To learn and exhibit compliance with exercise, home and travel O2 prescription;To learn and understand importance of maintaining oxygen saturations>88%;To learn and demonstrate proper use of respiratory medications;To learn and understand importance of monitoring SPO2 with pulse oximeter and demonstrate accurate use of the pulse oximeter.;To learn and demonstrate proper pursed lip breathing techniques or other breathing techniques.      Long  Term Goals  Exhibits compliance with exercise, home and travel O2 prescription;Verbalizes importance of monitoring SPO2 with pulse oximeter and return demonstration;Maintenance of O2 saturations>88%;Exhibits proper breathing techniques, such as pursed lip breathing or other method taught during program session;Compliance with respiratory medication;Demonstrates proper use of MDI's  Exhibits compliance with exercise, home and travel O2 prescription;Verbalizes importance of monitoring SPO2 with pulse oximeter and return demonstration;Maintenance of O2 saturations>88%;Exhibits proper breathing techniques, such as pursed lip breathing or other method taught during program session;Compliance with respiratory medication;Demonstrates proper use of MDI's      Goals/Expected Outcomes  understanding of the importance of home oxygen with exercise  understanding of the importance of home oxygen with exercise         Oxygen Discharge (Final Oxygen Re-Evaluation): Oxygen Re-Evaluation - 06/06/18 0924      Program Oxygen Prescription   Program Oxygen Prescription  E-Tanks;Continuous    Liters per minute  3    Comments  3 L on track, 2 L during seated exercise       Home Oxygen   Home Oxygen Device  Home Concentrator;Portable Concentrator    Sleep Oxygen Prescription  None    Home Exercise Oxygen Prescription  Pulsed    Liters per minute  3    Home  at Rest Exercise Oxygen Prescription  None    Compliance with Home Oxygen Use  Yes      Goals/Expected Outcomes   Short Term Goals  To learn and exhibit compliance with exercise, home and travel O2 prescription;To learn and understand importance of maintaining oxygen saturations>88%;To learn and demonstrate proper use of respiratory medications;To learn and understand importance of monitoring SPO2 with pulse oximeter and demonstrate accurate use of the pulse oximeter.;To learn and demonstrate proper pursed lip breathing techniques or other breathing techniques.    Long  Term Goals  Exhibits compliance with exercise, home and travel O2 prescription;Verbalizes importance of monitoring SPO2 with pulse oximeter and return demonstration;Maintenance of O2 saturations>88%;Exhibits proper breathing techniques, such as pursed lip breathing or other method taught during program session;Compliance with respiratory medication;Demonstrates proper use of MDI's    Goals/Expected Outcomes  understanding of the importance of home oxygen with exercise       Initial Exercise Prescription: Initial Exercise Prescription - 04/05/18 1600  Date of Initial Exercise RX and Referring Provider   Date  04/05/18    Referring Provider  Dr. Elsworth Soho      Oxygen   Oxygen  Continuous    Liters  --   will titrate on first day     Bike   Level  0.4    Minutes  17      NuStep   Level  2    SPM  80    Minutes  17    METs  1.5      Track   Laps  10    Minutes  17      Prescription Details   Frequency (times per week)  2    Duration  Progress to 45 minutes of aerobic exercise without signs/symptoms of physical distress      Intensity   THRR 40-80% of Max Heartrate  56-111    Ratings of Perceived Exertion  11-13    Perceived Dyspnea  0-4      Progression   Progression  Continue to progress workloads to maintain intensity without signs/symptoms of physical distress.      Resistance Training   Training  Prescription  Yes    Weight  BLUE BANDS    Reps  10-15       Perform Capillary Blood Glucose checks as needed.  Exercise Prescription Changes:  Exercise Prescription Changes    Row Name 04/24/18 1600 05/08/18 1500 05/22/18 1500 06/05/18 1500       Response to Exercise   Blood Pressure (Admit)  120/58  120/54  134/60  130/62    Blood Pressure (Exercise)  134/74  116/60  138/60  116/64    Blood Pressure (Exit)  112/68  102/78  120/60  100/60    Heart Rate (Admit)  69 bpm  65 bpm  57 bpm  61 bpm    Heart Rate (Exercise)  83 bpm  82 bpm  78 bpm  99 bpm    Heart Rate (Exit)  68 bpm  64 bpm  62 bpm  66 bpm    Oxygen Saturation (Admit)  94 %  96 %  97 %  95 %    Oxygen Saturation (Exercise)  90 %  88 %  93 %  88 %    Oxygen Saturation (Exit)  94 %  98 %  95 %  93 %    Rating of Perceived Exertion (Exercise)  _0 Perceived Dyspnea (Exercise)  _1 Duration  Progress to 45 minutes of aerobic exercise without signs/symptoms of physical distress  Progress to 45 minutes of aerobic exercise without signs/symptoms of physical distress  Progress to 45 minutes of aerobic exercise without signs/symptoms of physical distress  Continue with 45 min of aerobic exercise without signs/symptoms of physical distress.    Intensity  Other (comment) 40-80% of HRR  Other (comment) 40-80% of HRR  THRR unchanged  THRR unchanged      Progression   Progression  Continue to progress workloads to maintain intensity without signs/symptoms of physical distress.  Continue to progress workloads to maintain intensity without signs/symptoms of physical distress.  Continue to progress workloads to maintain intensity without signs/symptoms of physical distress.  Continue to progress workloads to maintain intensity without signs/symptoms of physical distress.      Resistance Training   Training Prescription  Yes  Yes  Yes  Yes  Weight  blue bands  blue bands  blue bands  blue bands    Reps  10-15   10-15  10-15  10-15    Time  10 Minutes  10 Minutes  10 Minutes  10 Minutes      Oxygen   Oxygen  Continuous  Continuous  Continuous  Continuous    Liters  2  2  2-3  2-3      Bike   Level  0.45  0.45  -  0.6    Minutes  17  17  -  17      NuStep   Level  _0 SPM  80  80  80  80    Minutes  _1 METs  2.1  2.8  2.1  2      Track   Laps  _2 Minutes  _3 Home Exercise Plan   Plans to continue exercise at  -  -  Home (comment)  -    Frequency  -  -  Add 2 additional days to program exercise sessions.  -       Exercise Comments:  Exercise Comments    Row Name 05/22/18 1549           Exercise Comments  home exercise complete           Exercise Goals and Review:  Exercise Goals    [place] Name 04/04/18 1426             Exercise Goals   Increase Physical Activity  Yes       Intervention  Provide advice, education, support and counseling about physical activity/exercise needs.;Develop an individualized exercise prescription for aerobic and resistive training based on initial evaluation findings, risk stratification, comorbidities and participant's personal goals.       Expected Outcomes  Short Term: Attend rehab on a regular basis to increase amount of physical activity.;Long Term: Exercising regularly at least 3-5 days a week.;Long Term: Add in home exercise to make exercise part of routine and to increase amount of physical activity.       Increase Strength and Stamina  Yes       Intervention  Provide advice, education, support and counseling about physical activity/exercise needs.;Develop an individualized exercise prescription for aerobic and resistive training based on initial evaluation findings, risk stratification, comorbidities and participant's personal goals.       Expected Outcomes  Short Term: Increase workloads from initial exercise prescription for resistance, speed, and METs.;Short Term: Perform resistance  training exercises routinely during rehab and add in resistance training at home;Long Term: Improve cardiorespiratory fitness, muscular endurance and strength as measured by increased METs and functional capacity (6MWT)       Able to understand and use rate of perceived exertion (RPE) scale  Yes       Intervention  Provide education and explanation on how to use RPE scale       Expected Outcomes  Short Term: Able to use RPE daily in rehab to express subjective intensity level;Long Term:  Able to use RPE to guide intensity level when exercising independently       Able to understand and use Dyspnea scale  Yes       Intervention  Provide education and explanation on  how to use Dyspnea scale       Expected Outcomes  Short Term: Able to use Dyspnea scale daily in rehab to express subjective sense of shortness of breath during exertion;Long Term: Able to use Dyspnea scale to guide intensity level when exercising independently       Knowledge and understanding of Target Heart Rate Range (THRR)  Yes       Intervention  Provide education and explanation of THRR including how the numbers were predicted and where they are located for reference       Expected Outcomes  Short Term: Able to state/look up THRR;Long Term: Able to use THRR to govern intensity when exercising independently;Short Term: Able to use daily as guideline for intensity in rehab       Understanding of Exercise Prescription  Yes       Intervention  Provide education, explanation, and written materials on patient's individual exercise prescription       Expected Outcomes  Short Term: Able to explain program exercise prescription;Long Term: Able to explain home exercise prescription to exercise independently          Exercise Goals Re-Evaluation : Exercise Goals Re-Evaluation    Row Name 05/08/18 1650 06/06/18 0926           Exercise Goal Re-Evaluation   Exercise Goals Review  Increase Physical Activity;Increase Strength and  Stamina;Able to understand and use rate of perceived exertion (RPE) scale;Able to understand and use Dyspnea scale;Knowledge and understanding of Target Heart Rate Range (THRR);Understanding of Exercise Prescription  Increase Physical Activity;Increase Strength and Stamina;Able to understand and use rate of perceived exertion (RPE) scale;Able to understand and use Dyspnea scale;Knowledge and understanding of Target Heart Rate Range (THRR);Understanding of Exercise Prescription      Comments  Patient is progressing in program. He has increase average METs from 2.0 to 2.4, increase laps from 10-15 laps (200 ft) in 15 minutes. Patient is waiting on POC delivery, Will discuss home exercise at that point. Will cont to monitor and motivate as able.  Pt continues to progress. Pt can now do 17 laps (1 lap=200 ft) in 15 minutes and his METs remain at 2.0-2.4. Have discussed with pt home exercise and the importance of adhering to oxygen prescription. Will continue to monitor and progress as able.       Expected Outcomes  Through exercise at rehab and at home, patient will increase functional ability, have a decrease in sob with ADL's, and establish an exercise regimine at home.  Through exercise at rehab and at home, patient will increase functional ability, have a decrease in sob with ADL's, and establish an exercise regimine at home.         Discharge Exercise Prescription (Final Exercise Prescription Changes): Exercise Prescription Changes - 06/05/18 1500      Response to Exercise   Blood Pressure (Admit)  130/62    Blood Pressure (Exercise)  116/64    Blood Pressure (Exit)  100/60    Heart Rate (Admit)  61 bpm    Heart Rate (Exercise)  99 bpm    Heart Rate (Exit)  66 bpm    Oxygen Saturation (Admit)  95 %    Oxygen Saturation (Exercise)  88 %    Oxygen Saturation (Exit)  93 %    Rating of Perceived Exertion (Exercise)  14    Perceived Dyspnea (Exercise)  3    Duration  Continue with 45 min of aerobic  exercise without signs/symptoms of physical distress.  Intensity  THRR unchanged      Progression   Progression  Continue to progress workloads to maintain intensity without signs/symptoms of physical distress.      Resistance Training   Training Prescription  Yes    Weight  blue bands    Reps  10-15    Time  10 Minutes      Oxygen   Oxygen  Continuous    Liters  2-3      Bike   Level  0.6    Minutes  17      NuStep   Level  4    SPM  80    Minutes  17    METs  2      Track   Laps  17    Minutes  17       Nutrition:  Target Goals: Understanding of nutrition guidelines, daily intake of sodium <1539m, cholesterol <2058m calories 30% from fat and 7% or less from saturated fats, daily to have 5 or more servings of fruits and vegetables.  Biometrics:    Nutrition Therapy Plan and Nutrition Goals: Nutrition Therapy & Goals - 04/06/18 0746      Nutrition Therapy   Diet  heart healthy      Personal Nutrition Goals   Nutrition Goal  pt to identify and limit food sources of sodium, saturated fat, trans fat, refined carbohydrates    Personal Goal #2  identify food quantitites necessary to achieve wt loss of 1/2 -2# per week to a goal wt loss of 2.7-10.9 kg at graduation from pulmonary rehab    Personal Goal #3  describe the benefit of including fruits, vegetables, whole grains, and low-fat dairy products in a healthy meal plan      Intervention Plan   Intervention  Prescribe, educate and counsel regarding individualized specific dietary modifications aiming towards targeted core components such as weight, hypertension, lipid management, diabetes, heart failure and other comorbidities.    Expected Outcomes  Short Term Goal: Understand basic principles of dietary content, such as calories, fat, sodium, cholesterol and nutrients.;Long Term Goal: Adherence to prescribed nutrition plan.       Nutrition Assessments: Nutrition Assessments - 04/06/18 0749      Rate Your  Plate Scores   Pre Score  42       Nutrition Goals Re-Evaluation:   Nutrition Goals Discharge (Final Nutrition Goals Re-Evaluation):   Psychosocial: Target Goals: Acknowledge presence or absence of significant depression and/or stress, maximize coping skills, provide positive support system. Participant is able to verbalize types and ability to use techniques and skills needed for reducing stress and depression.  Initial Review & Psychosocial Screening: Initial Psych Review & Screening - 04/04/18 1438      Initial Review   Current issues with  Current Stress Concerns    Source of Stress Concerns  Family    Comments  Cares for wife who is 9858ear old.  Has respite care at times      [place]      Barriers   Psychosocial barriers to participate in program  There are no identifiable barriers or psychosocial needs.;The patient should benefit from training in stress management and relaxation.      Screening Interventions   Expected Outcomes  Long Term goal: The participant improves quality of Life and PHQ9 Scores as seen by post scores and/or verbalization of changes;Short Term goal: Identification and review with participant of any  Quality of Life or Depression concerns found by scoring the questionnaire.       Quality of Life Scores:  Scores of 19 and below usually indicate a poorer quality of life in these areas.  A difference of  2-3 points is a clinically meaningful difference.  A difference of 2-3 points in the total score of the Quality of Life Index has been associated with significant improvement in overall quality of life, self-image, physical symptoms, and general health in studies assessing change in quality of life.  PHQ-9: Recent Review Flowsheet Data    Depression screen [facility] 2/9 04/04/2018 02/23/2015   Decreased Interest 0 0   Down, Depressed, Hopeless 0  0   PHQ - 2 Score 0 0   Altered sleeping 0  -   Tired, decreased energy 0 -     Change in appetite 0 -   Feeling bad or failure about yourself  0 -   Trouble concentrating 1 -   Moving slowly or fidgety/restless 1 -   Suicidal thoughts 0 -   PHQ-9 Score 2 -   Difficult doing work/chores Not difficult at all -     Interpretation of Total Score  Total Score Depression Severity:  1-4 = Minimal depression, 5-9 = Mild depression, 10-14 = Moderate depression, 15-19 = Moderately severe depression, 20-27 = Severe depression   Psychosocial Evaluation and Intervention: Psychosocial Evaluation - 06/06/18 1251      Psychosocial Evaluation & Interventions   Interventions  Stress management education    Comments  Pt has a realistic outlook on his future.  Pt participates in clinical trials in an effort to give back.  Pt attended the support group meeting next week for Pulmonary Fibrosis    Expected Outcomes  Pt will seek community resources to assit with his wifes care.  Pt will display healthy and positive coping skills    Continue Psychosocial Services   Follow up required by staff       Psychosocial Re-Evaluation: Psychosocial Re-Evaluation    [place] Name 04/12/18 1726 04/12/18 1727 04/12/18 1728 05/09/18 1945 06/06/18 1251     Psychosocial Re-Evaluation   Current issues with  None Identified  -  Current Stress Concerns  Current Stress Concerns  Current Stress Concerns   Comments  Pt is upbeat and positve.  Very knowledgeable about his disease process  -  wife is advanced age and requires 24/7 care  wife is advanced age and requires 24/7 care  wife is advanced age and requires 24/7 care   Expected Outcomes  -  Pt will continue to possess mental well being  -  Pt will continue to possess mental well being  Pt will continue to possess mental well being   Interventions  Relaxation education;Stress management education;Encouraged to attend Pulmonary Rehabilitation for the exercise  -  -  Relaxation education;Stress management education;Encouraged to attend Pulmonary  Rehabilitation for the exercise  Relaxation education;Stress management education;Encouraged to attend Pulmonary Rehabilitation for the exercise   Continue Psychosocial Services   Follow up required by staff  -  -  Follow up required by staff  Follow up required by staff   Comments  -  -  -  -  Cares for wife who is 76 year old.  Has respite care at times     Initial Review   Source of Stress Concerns  -  -  -  -  Family      Psychosocial Discharge (Final Psychosocial Re-Evaluation): Psychosocial Re-Evaluation -  06/06/18 1251      Psychosocial Re-Evaluation   Current issues with  Current Stress Concerns    Comments  wife is advanced age and requires 24/7 care    Expected Outcomes  Pt will continue to possess mental well being    Interventions  Relaxation education;Stress management education;Encouraged to attend Pulmonary Rehabilitation for the exercise    Continue Psychosocial Services   Follow up required by staff    Comments  Cares for wife who is 12 year old.  Has respite care at times      Initial Review   Source of Stress Concerns  Family       Education: Education Goals: Education classes will be provided on a weekly basis, covering required topics. Participant will state understanding/return demonstration of topics presented.  Learning Barriers/Preferences: Learning Barriers/Preferences - 04/04/18 1441      Learning Barriers/Preferences   Learning Preferences  Computer/Internet;Group Instruction;Individual Instruction;Written Material       Education Topics: Risk Factor Reduction:  -Group instruction that is supported by a PowerPoint presentation. Instructor discusses the definition of a risk factor, different risk factors for pulmonary disease, and how the heart and lungs work together.     Nutrition for Pulmonary Patient:  -Group instruction provided by PowerPoint slides, verbal discussion, and written materials to support subject matter. The instructor gives an  explanation and review of healthy diet recommendations, which includes a discussion on weight management, recommendations for fruit and vegetable consumption, as well as protein, fluid, caffeine, fiber, sodium, sugar, and alcohol. Tips for eating when patients are short of breath are discussed.   Pursed Lip Breathing:  -Group instruction that is supported by demonstration and informational handouts. Instructor discusses the benefits of pursed lip and diaphragmatic breathing and detailed demonstration on how to preform both.     Oxygen Safety:  -Group instruction provided by PowerPoint, verbal discussion, and written material to support subject matter. There is an overview of "What is Oxygen" and "Why do we need it".  Instructor also reviews how to create a safe environment for oxygen use, the importance of using oxygen as prescribed, and the risks of noncompliance. There is a brief discussion on traveling with oxygen and resources the patient may utilize.   PULMONARY REHAB OTHER RESPIRATORY from 05/31/2018 in [place]  Date  04/26/18  Educator  EP  Instruction Review Code  1- Verbalizes Understanding      Oxygen Equipment:  -Group instruction provided by [employer] utilizing handouts, written materials, and Insurance underwriter.   PULMONARY REHAB OTHER RESPIRATORY from 05/31/2018 in [place]  Date  05/03/18  Educator  [employer]  Instruction Review Code  1- Verbalizes Understanding      Signs and Symptoms:  -Group instruction provided by written material and verbal discussion to support subject matter. Warning signs and symptoms of infection, stroke, and heart attack are reviewed and when to call the physician/911 reinforced. Tips for preventing the spread of infection discussed.   Advanced Directives:  -Group instruction provided by verbal instruction and written material to support subject matter. Instructor  reviews Advanced Directive laws and proper instruction for filling out document.   Pulmonary Video:  -Group video education that reviews the importance of medication and oxygen compliance, exercise, good nutrition, pulmonary hygiene, and pursed lip and diaphragmatic breathing for the pulmonary patient.   Exercise for the Pulmonary Patient:  -Group instruction that is supported by a PowerPoint presentation. Instructor discusses benefits  of exercise, core components of exercise, frequency, duration, and intensity of an exercise routine, importance of utilizing pulse oximetry during exercise, safety while exercising, and options of places to exercise outside of rehab.     PULMONARY REHAB OTHER RESPIRATORY from 05/31/2018 in [place]  Date  05/10/18  Educator  molly  Instruction Review Code  1- Verbalizes Understanding      Pulmonary Medications:  -Verbally interactive group education provided by instructor with focus on inhaled medications and proper administration.   Anatomy and Physiology of the Respiratory System and Intimacy:  -Group instruction provided by PowerPoint, verbal discussion, and written material to support subject matter. Instructor reviews respiratory cycle and anatomical components of the respiratory system and their functions. Instructor also reviews differences in obstructive and restrictive respiratory diseases with examples of each. Intimacy, Sex, and Sexuality differences are reviewed with a discussion on how relationships can change when diagnosed with pulmonary disease. Common sexual concerns are reviewed.   PULMONARY REHAB OTHER RESPIRATORY from 05/31/2018 in [place]  Date  05/31/18  Educator  RN  Instruction Review Code  2- Demonstrated Understanding      MD DAY -A group question and answer session with a medical doctor that allows participants to ask questions that relate to their pulmonary  disease state.   PULMONARY REHAB OTHER RESPIRATORY from 05/31/2018 in [place]  Date  05/17/18  Educator  Dr. Nelda Marseille  Instruction Review Code  1- Verbalizes Understanding      OTHER EDUCATION -Group or individual verbal, written, or video instructions that support the educational goals of the pulmonary rehab program.   PULMONARY REHAB OTHER RESPIRATORY from 05/31/2018 in [place]  Date  04/12/18  Educator  Cloyde Reams  Instruction Review Code  1- Verbalizes Understanding [Sedentary Lifestyle]      Holiday Eating Survival Tips:  -Group instruction provided by [employer], verbal discussion, and written materials to support subject matter. The instructor gives patients tips, tricks, and techniques to help them not only survive but enjoy the holidays despite the onslaught of food that accompanies the holidays.   Knowledge Questionnaire Score: Knowledge Questionnaire Score - 04/11/18 1519      Knowledge Questionnaire Score   Pre Score  14/18       Core Components/Risk Factors/Patient Goals at Admission: Personal Goals and Risk Factors at Admission - 04/04/18 1434      Core Components/Risk Factors/Patient Goals on Admission    Weight Management  Yes;Weight Loss    Intervention  Obesity: Provide education and appropriate resources to help participant work on and attain dietary goals.;Weight Management/Obesity: Establish reasonable short term and long term weight goals.;Weight Management: Provide education and appropriate resources to help participant work on and attain dietary goals.;Weight Management: Develop a combined nutrition and exercise program designed to reach desired caloric intake, while maintaining appropriate intake of nutrient and fiber, sodium and fats, and appropriate energy expenditure required for the weight goal.    Admit Weight  207 lb 10.8 oz (94.2 kg)    Goal Weight: Short Term  200 lb (90.7 kg)     Goal Weight: Long Term  190 lb (86.2 kg)    Expected Outcomes  Short Term: Continue to assess and modify interventions until short term weight is achieved;Weight Loss: Understanding of general recommendations for a balanced deficit meal plan, which promotes 1-2 lb weight loss per week and includes a negative energy balance of  [phone] kcal/d;Understanding recommendations for meals to include 15-35% energy as protein, 25-35% energy from fat, 35-60% energy from carbohydrates, less than 231m of dietary cholesterol, 20-35 gm of total fiber daily;Understanding of distribution of calorie intake throughout the day with the consumption of 4-5 meals/snacks    Improve shortness of breath with ADL's  Yes    Intervention  Provide education, individualized exercise plan and daily activity instruction to help decrease symptoms of SOB with activities of daily living.    Expected Outcomes  Short Term: Improve cardiorespiratory fitness to achieve a reduction of symptoms when performing ADLs;Long Term: Be able to perform more ADLs without symptoms or delay the onset of symptoms    Lipids  Yes    Intervention  Provide education and support for participant on nutrition & aerobic/resistive exercise along with prescribed medications to achieve LDL <772m HDL >4023m   Expected Outcomes  Short Term: Participant states understanding of desired cholesterol values and is compliant with medications prescribed. Participant is following exercise prescription and nutrition guidelines.;Long Term: Cholesterol controlled with medications as prescribed, with individualized exercise RX and with personalized nutrition plan. Value goals: LDL < 38m46mDL > 40 mg.    Stress  Yes    Intervention  Offer individual and/or small group education and counseling on adjustment to heart disease, stress management and health-related lifestyle change. Teach and support self-help strategies.    Expected Outcomes  Short Term: Participant demonstrates  changes in health-related behavior, relaxation and other stress management skills, ability to obtain effective social support, and compliance with psychotropic medications if prescribed.;Long Term: Emotional wellbeing is indicated by absence of clinically significant psychosocial distress or social isolation.   cares for 89 y65r old wife      Core Components/Risk Factors/Patient Goals Review:  Goals and Risk Factor Review    Row Name 04/12/18 1728 05/09/18 1945 06/06/18 1251         Core Components/Risk Factors/Patient Goals Review   Personal Goals Review  Weight Management/Obesity;Develop more efficient breathing techniques such as purse lipped breathing and diaphragmatic breathing and practicing self-pacing with activity.;Stress;Increase knowledge of respiratory medications and ability to use respiratory devices properly.;Improve shortness of breath with ADL's  Weight Management/Obesity;Develop more efficient breathing techniques such as purse lipped breathing and diaphragmatic breathing and practicing self-pacing with activity.;Stress;Increase knowledge of respiratory medications and ability to use respiratory devices properly.;Improve shortness of breath with ADL's  Weight Management/Obesity;Develop more efficient breathing techniques such as purse lipped breathing and diaphragmatic breathing and practicing self-pacing with activity.;Stress;Increase knowledge of respiratory medications and ability to use respiratory devices properly.;Improve shortness of breath with ADL's     Review  Pt completed 1 exercise session.  Unable to assess pt progress.  Anticipate pt will show measurable progress within the next 30 day assessment  Pt completed 7 exercise sessions and 3 education classes.  Pt understands the importance of using his oxygen but has not yet started to use this at home.  Pt places it on for exercise only despite having to increase his oxygen therapy to 3L while ambulating. continue to  encourage pt and remind him of the importance of maintaining oxygen saturation.  Pt weight is down 2.6 kg since starting on 9/19/  Pt rates 2 for dypnea.  Needs some verbal reminders for PLB. Pt workloads show increase to level 3 on nustep, averages 15 laps on the track and 06 on the airydyne. Pt stress level remains the same.  Pt wife health requires nearly constant supervision  by  pt  Anticipate pt will show measurable progress within the next 30 day assessment  Pt completed ex15 exercise sessions and 7 education classes.  Pt understands the importance of using his oxygen but has not yet started to use this at home.  Pt places it on for exercise only despite having to increase his oxygen therapy to 3L while ambulating. continue to encourage pt and remind him of the importance of maintaining oxygen saturation.  Pt weight is down .9kg since starting on 9/19/  Pt rates 2 for dypnea.  Needs some verbal reminders for PLB.  Continue to reinforce. Pt workloads show increase to level 4 on nustep, averages 15 laps on the track in 15 minutes and 0.6 on the airydyne. Pt stress level remains the same.  Pt wife health requires nearly constant supervision  by pt  Anticipate pt will show measurable progress within the next 30 day assessment     Expected Outcomes  See Admission Goals/Outcomes  See Admission Goals/Outcomes  See Admission Goals/Outcomes        Core Components/Risk Factors/Patient Goals at Discharge (Final Review):  Goals and Risk Factor Review - 06/06/18 1251      Core Components/Risk Factors/Patient Goals Review   Personal Goals Review  Weight Management/Obesity;Develop more efficient breathing techniques such as purse lipped breathing and diaphragmatic breathing and practicing self-pacing with activity.;Stress;Increase knowledge of respiratory medications and ability to use respiratory devices properly.;Improve shortness of breath with ADL's    Review  Pt completed ex15 exercise sessions and 7 education  classes.  Pt understands the importance of using his oxygen but has not yet started to use this at home.  Pt places it on for exercise only despite having to increase his oxygen therapy to 3L while ambulating. continue to encourage pt and remind him of the importance of maintaining oxygen saturation.  Pt weight is down .9kg since starting on 9/19/  Pt rates 2 for dypnea.  Needs some verbal reminders for PLB.  Continue to reinforce. Pt workloads show increase to level 4 on nustep, averages 15 laps on the track in 15 minutes and 0.6 on the airydyne. Pt stress level remains the same.  Pt wife health requires nearly constant supervision  by pt  Anticipate pt will show measurable progress within the next 30 day assessment    Expected Outcomes  See Admission Goals/Outcomes       ITP Comments: ITP Comments    Row Name 04/04/18 1401 04/12/18 1725 04/12/18 1730 05/09/18 1944 06/06/18 1250   ITP Comments  Dr, Manfred Arch, Medical Director Pulmonary Rehab  Dr, Manfred Arch, Medical Director Pulmonary Rehab  Dr, Manfred Arch, Medical Director Pulmonary Rehab  Dr, Manfred Arch, Medical Director Pulmonary Rehab  Dr, Manfred Arch, Medical Director Pulmonary Rehab      Comments: Pt has completed 15 exercise sessions.  Continue to monitor pt progress during the next 30 day assessment. Cherre Huger, BSN Cardiac and Pulmonary Rehab Nurse Navigator ;l

## 2018-06-07 ENCOUNTER — Encounter (HOSPITAL_COMMUNITY)
Admission: RE | Admit: 2018-06-07 | Discharge: 2018-06-07 | Disposition: A | Discharge status: Home / Self Care | Payer: Medicare Other | Source: Ambulatory Visit | Attending: Internal Medicine | Admitting: Internal Medicine

## 2018-06-07 DIAGNOSIS — J84112 Idiopathic pulmonary fibrosis: Secondary | ICD-10-CM | POA: Diagnosis not present

## 2018-06-07 NOTE — Progress Notes (Signed)
Daily Session Note  Patient Details  Name: Ian Hale MRN: [id number] Date of Birth: 07/13/1935 Referring Provider:     Pulmonary Rehab Walk Test from 04/05/2018 in [facility]  Referring Provider  Dr. Alva      Encounter Date: 06/07/2018  Check In: Session Check In - 06/07/18 1330      Check-In   Supervising physician immediately available to respond to emergencies  [facility] immediately available    Physician(s)  Dr. Krishnan    Location  [facility]    Staff Present  Joan Behrens, RN, BSN;Carlette Carlton, RN, BSN;Dalton Fletcher, MS, Exercise Physiologist;Lisa Hughes, RN    Medication changes reported      No    Fall or balance concerns reported     No    Tobacco Cessation  No Change    Warm-up and Cool-down  Performed as group-led instruction    Resistance Training Performed  Yes    VAD Patient?  No    PAD/SET Patient?  No      Pain Assessment   Currently in Pain?  No/denies    Multiple Pain Sites  No       Capillary Blood Glucose: No results found for this or any previous visit (from the past 24 hour(s)).    Social History   Tobacco Use  Smoking Status Never Smoker  Smokeless Tobacco Never Used    Goals Met:  Proper associated with RPD/PD & O2 Sat Exercise tolerated well  Goals Unmet:  Not Applicable  Comments: Service time is from 1330 to 1510    Dr. Wesam G. Yacoub is Medical Director for Pulmonary Rehab at [facility]. 

## 2018-06-12 ENCOUNTER — Encounter (HOSPITAL_COMMUNITY)
Admission: RE | Admit: 2018-06-12 | Discharge: 2018-06-12 | Disposition: A | Discharge status: Home / Self Care | Payer: Medicare Other | Source: Ambulatory Visit | Attending: Internal Medicine | Admitting: Internal Medicine

## 2018-06-12 DIAGNOSIS — J84112 Idiopathic pulmonary fibrosis: Secondary | ICD-10-CM

## 2018-06-12 NOTE — Progress Notes (Signed)
Daily Session Note  Patient Details  Name: Ian Hale MRN: [id number] Date of Birth: May 04, 1935 Referring Provider:     Pulmonary Rehab Walk Test from 04/05/2018 in [place]  Referring Provider  Dr. Elsworth Soho      Encounter Date: 06/12/2018  Check In:   Capillary Blood Glucose: No results found for this or any previous visit (from the past 24 hour(s)).    Social History   Tobacco Use  Smoking Status Never Smoker  Smokeless Tobacco Never Used    Goals Met:  Proper associated with RPD/PD & O2 Sat Exercise tolerated well  Goals Unmet:  Not Applicable  Comments: Service time is from 1330 to [place]    Dr. Rush Farmer is Medical Director for Pulmonary Rehab at [facility].

## 2018-06-14 ENCOUNTER — Other Ambulatory Visit: Payer: Self-pay | Admitting: Cardiovascular Disease

## 2018-06-14 ENCOUNTER — Encounter (HOSPITAL_COMMUNITY)
Admission: RE | Admit: 2018-06-14 | Discharge: 2018-06-14 | Disposition: A | Discharge status: Home / Self Care | Payer: Medicare Other | Source: Ambulatory Visit | Attending: Internal Medicine | Admitting: Internal Medicine

## 2018-06-14 DIAGNOSIS — J84112 Idiopathic pulmonary fibrosis: Secondary | ICD-10-CM | POA: Diagnosis not present

## 2018-06-14 NOTE — Progress Notes (Signed)
Daily Session Note  Patient Details  Name: Ian Hale MRN: [id number] Date of Birth: [birth year] Referring Provider:     Pulmonary Rehab Walk Test from 04/05/2018 in [place]  Referring Provider  Dr. Elsworth Soho      Encounter Date: 06/14/2018  Check In:   Capillary Blood Glucose: No results found for this or any previous visit (from the past 24 hour(s)).    Social History   Tobacco Use  Smoking Status Never Smoker  Smokeless Tobacco Never Used    Goals Met:  Proper associated with RPD/PD & O2 Sat Exercise tolerated well  Goals Unmet:  Not Applicable  Comments: Service time is from 1330 to [place]    Dr. Rush Farmer is Medical Director for Pulmonary Rehab at [facility].

## 2018-06-19 ENCOUNTER — Encounter (HOSPITAL_COMMUNITY): Payer: Medicare Other

## 2018-06-20 ENCOUNTER — Telehealth: Payer: Self-pay

## 2018-06-20 NOTE — Telephone Encounter (Signed)
   Primary Cardiologist: Kristeen MissPhilip Nahser, MD  Chart reviewed as part of pre-operative protocol coverage. Cataract extractions are recognized in guidelines as low risk surgeries that do not typically require specific preoperative testing or holding of blood thinner therapy. Therefore, given past medical history and time since last visit, based on ACC/AHA guidelines, Elana AlmRobert A Hoiland would be at acceptable risk for the planned procedure without further cardiovascular testing.   Please call with questions.  Tereso NewcomerScott Weaver, PA-C 06/20/2018, 5:14 PM

## 2018-06-20 NOTE — Telephone Encounter (Signed)
   Call back staff: Marland Kitchen. This note has been faxed to the requesting surgeon. . Please contact the surgeon's office to ensure it has been received. . This phone note will be removed from the preop pool. . Please sign encounter when completed.  Tereso NewcomerScott Liliani Bobo, PA-C    06/20/2018 5:16 PM

## 2018-06-20 NOTE — Telephone Encounter (Signed)
   [facility] Pre-operative Risk Assessment    Request for surgical clearance:  1. What type of surgery is being performed? Cataract Extraction with Intraocular Lens Implantation of the Right Eye followed by the Left eye  2. When is this surgery scheduled? 07/30/18 (Right Eye) and 08/20/18 (Left Eye)  3. What type of clearance is required (medical clearance vs. Pharmacy clearance to hold med vs. Both)? Medical Clearance  4. Are there any medications that need to be held prior to surgery and how long? None   5. Practice name and name of physician performing surgery? Dr. Theodis [facility] [place]   6. What is your office phone number? [phone]     7.   What is your office fax number? [phone]  8.   Anesthesia type (None, local, MAC, general)? Topical Anesthesia with IV medication

## 2018-06-26 ENCOUNTER — Encounter (HOSPITAL_COMMUNITY)
Admission: RE | Admit: 2018-06-26 | Discharge: 2018-06-26 | Disposition: A | Discharge status: Home / Self Care | Payer: Medicare Other | Source: Ambulatory Visit | Attending: Internal Medicine | Admitting: Internal Medicine

## 2018-06-26 DIAGNOSIS — Z7982 Long term (current) use of aspirin: Secondary | ICD-10-CM | POA: Insufficient documentation

## 2018-06-26 DIAGNOSIS — J84112 Idiopathic pulmonary fibrosis: Secondary | ICD-10-CM | POA: Diagnosis not present

## 2018-06-26 DIAGNOSIS — Z79899 Other long term (current) drug therapy: Secondary | ICD-10-CM | POA: Insufficient documentation

## 2018-06-26 DIAGNOSIS — I2510 Atherosclerotic heart disease of native coronary artery without angina pectoris: Secondary | ICD-10-CM | POA: Insufficient documentation

## 2018-06-26 DIAGNOSIS — R942 Abnormal results of pulmonary function studies: Secondary | ICD-10-CM | POA: Insufficient documentation

## 2018-06-26 DIAGNOSIS — R918 Other nonspecific abnormal finding of lung field: Secondary | ICD-10-CM | POA: Insufficient documentation

## 2018-06-26 DIAGNOSIS — N400 Enlarged prostate without lower urinary tract symptoms: Secondary | ICD-10-CM | POA: Insufficient documentation

## 2018-06-26 DIAGNOSIS — E785 Hyperlipidemia, unspecified: Secondary | ICD-10-CM | POA: Insufficient documentation

## 2018-06-26 DIAGNOSIS — I10 Essential (primary) hypertension: Secondary | ICD-10-CM | POA: Diagnosis not present

## 2018-06-26 NOTE — Progress Notes (Signed)
Daily Session Note  Patient Details  Name: Ian Hale MRN: [id number] Date of Birth: 01/27/1935 Referring Provider:     Pulmonary Rehab Walk Test from 04/05/2018 in [facility]  Referring Provider  Dr. Alva      Encounter Date: 06/26/2018  Check In: Session Check In - 06/26/18 1353      Check-In   Supervising physician immediately available to respond to emergencies  [facility] immediately available    Physician(s)  Dr. Grunz    Location  [facility]    Staff Present  Joan Behrens, RN, BSN;Dalton Fletcher, MS, Exercise Physiologist;Carlette Carlton, RN, BSN;Lisa Hughes, RN    Medication changes reported      No    Fall or balance concerns reported     No    Warm-up and Cool-down  Performed as group-led instruction    Resistance Training Performed  Yes    VAD Patient?  No    PAD/SET Patient?  No      Pain Assessment   Currently in Pain?  No/denies    Pain Score  0-No pain    Multiple Pain Sites  No       Capillary Blood Glucose: No results found for this or any previous visit (from the past 24 hour(s)).    Social History   Tobacco Use  Smoking Status Never Smoker  Smokeless Tobacco Never Used    Goals Met:  Exercise tolerated well No report of cardiac concerns or symptoms Strength training completed today  Goals Unmet:  Not Applicable  Comments: Service time is from 1330 to 1520    Dr. Wesam G. Yacoub is Medical Director for Pulmonary Rehab at [facility]. 

## 2018-06-28 ENCOUNTER — Encounter (HOSPITAL_COMMUNITY)
Admission: RE | Admit: 2018-06-28 | Discharge: 2018-06-28 | Disposition: A | Discharge status: Home / Self Care | Payer: Medicare Other | Source: Ambulatory Visit | Attending: Internal Medicine | Admitting: Internal Medicine

## 2018-06-28 DIAGNOSIS — J84112 Idiopathic pulmonary fibrosis: Secondary | ICD-10-CM | POA: Diagnosis not present

## 2018-06-28 NOTE — Progress Notes (Signed)
Daily Session Note  Patient Details  Name: Ian Hale MRN: [id number] Date of Birth: 1934/09/18 Referring Provider:     Pulmonary Rehab Walk Test from 04/05/2018 in [place]  Referring Provider  Dr. Elsworth Soho      Encounter Date: 06/28/2018  Check In: Session Check In - 06/28/18 1332      Check-In   Supervising physician immediately available to respond to emergencies  [facility] immediately available    Physician(s)  Dr. Wyline Copas    Location  [facility]    Staff Present  Rosebud Poles, RN, BSN;Carlette Wilber Oliphant, RN, Bjorn Loser, MS, Exercise Physiologist;Lisa Ysidro Evert, RN    Medication changes reported      No    Fall or balance concerns reported     No    Tobacco Cessation  No Change    Warm-up and Cool-down  Performed as group-led instruction    Resistance Training Performed  Yes    VAD Patient?  No    PAD/SET Patient?  No      Pain Assessment   Currently in Pain?  No/denies    Multiple Pain Sites  No       Capillary Blood Glucose: No results found for this or any previous visit (from the past 24 hour(s)).    Social History   Tobacco Use  Smoking Status Never Smoker  Smokeless Tobacco Never Used    Goals Met:  Proper associated with RPD/PD & O2 Sat Exercise tolerated well  Goals Unmet:  Not Applicable  Comments: Service time is from 1330 to [place]    Dr. Rush Farmer is Medical Director for Pulmonary Rehab at [facility]

## 2018-06-29 ENCOUNTER — Encounter: Payer: Medicare Other | Admitting: Internal Medicine

## 2018-07-02 ENCOUNTER — Encounter: Payer: Medicare Other | Admitting: Internal Medicine

## 2018-07-03 ENCOUNTER — Encounter (HOSPITAL_COMMUNITY)
Admission: RE | Admit: 2018-07-03 | Discharge: 2018-07-03 | Disposition: A | Discharge status: Home / Self Care | Payer: Medicare Other | Source: Ambulatory Visit | Attending: Internal Medicine | Admitting: Internal Medicine

## 2018-07-03 VITALS — Wt 200.6 lb

## 2018-07-03 DIAGNOSIS — J84112 Idiopathic pulmonary fibrosis: Secondary | ICD-10-CM | POA: Diagnosis not present

## 2018-07-03 NOTE — Progress Notes (Signed)
Daily Session Note  Patient Details  Name: Ian Hale MRN: [id number] Date of Birth: January 14, 1935 Referring Provider:     Pulmonary Rehab Walk Test from 04/05/2018 in [place]  Referring Provider  Dr. Elsworth Soho      Encounter Date: 07/03/2018  Check In: Session Check In - 07/03/18 1524      Check-In   Supervising physician immediately available to respond to emergencies  [facility] immediately available    Physician(s)  Dr. Wyline Copas    Location  [facility]    Staff Present  Rosebud Poles, RN, BSN;Carlette Wilber Oliphant, RN, Bjorn Loser, MS, Exercise Physiologist;Lisa Ysidro Evert, RN    Medication changes reported      No    Fall or balance concerns reported     No    Tobacco Cessation  No Change    Warm-up and Cool-down  Performed as group-led instruction    Resistance Training Performed  Yes    VAD Patient?  No    PAD/SET Patient?  No      Pain Assessment   Currently in Pain?  No/denies    Pain Score  0-No pain    Multiple Pain Sites  No       Capillary Blood Glucose: No results found for this or any previous visit (from the past 24 hour(s)).  Exercise Prescription Changes - 07/03/18 1600      Response to Exercise   Blood Pressure (Admit)  102/60    Blood Pressure (Exercise)  132/66    Blood Pressure (Exit)  102/50    Heart Rate (Admit)  62 bpm    Heart Rate (Exercise)  82 bpm    Heart Rate (Exit)  63 bpm    Oxygen Saturation (Admit)  93 %    Oxygen Saturation (Exercise)  90 %    Oxygen Saturation (Exit)  94 %    Rating of Perceived Exertion (Exercise)  14    Perceived Dyspnea (Exercise)  2    Duration  Progress to 45 minutes of aerobic exercise without signs/symptoms of physical distress    Intensity  THRR unchanged      Progression   Progression  Continue to progress workloads to maintain intensity without signs/symptoms of physical distress.      Resistance Training   Training Prescription  Yes    Weight   blue bands    Reps  10-15    Time  10 Minutes      Interval Training   Interval Training  No      Oxygen   Oxygen  Continuous    Liters  2-3      Bike   Level  0.7    Minutes  17      NuStep   Level  4    SPM  80    Minutes  17    METs  2      Track   Laps  15    Minutes  17       Social History   Tobacco Use  Smoking Status Never Smoker  Smokeless Tobacco Never Used    Goals Met:  Proper associated with RPD/PD & O2 Sat Exercise tolerated well  Goals Unmet:  Not Applicable  Comments: Service time is from 1330 to 1510    Dr. Rush Farmer is Medical Director for Pulmonary Rehab at [facility].

## 2018-07-04 ENCOUNTER — Encounter: Payer: Self-pay | Admitting: Internal Medicine

## 2018-07-04 ENCOUNTER — Encounter (INDEPENDENT_AMBULATORY_CARE_PROVIDER_SITE_OTHER): Payer: Medicare Other | Admitting: Internal Medicine

## 2018-07-04 ENCOUNTER — Encounter: Payer: Medicare Other | Admitting: *Deleted

## 2018-07-04 DIAGNOSIS — J84112 Idiopathic pulmonary fibrosis: Secondary | ICD-10-CM

## 2018-07-04 DIAGNOSIS — Z006 Encounter for examination for normal comparison and control in clinical research program: Secondary | ICD-10-CM

## 2018-07-04 NOTE — Research (Signed)
Title: GALACTIC-1 [facility]) is a Phase 2b, randomized, double-blind, parallel, placebo-controlled multicenter international study to evaluate evaluate the efficacy and safety of two doses (placebo v 20m v 174m of TD139 administered once a day for 52 weeks as compared to placebo in subjects with Idiopathic Pulmonary Fibrosis. Key Primary End Point: Annual rate of decline in FVC expressed in mL over 52 weeks  Clinical ReResearch officer, political party Research RN note : This visit for Subject Ian Hale with DOB: 2/Jan 23, 1936n 07/04/2018 for the above protocol is Visit/Encounter # Visit 4 week 8  and is for purpose of research . The subject was first re-consented to the Main ICF Version 3.0 and Optional Genetic Consent Version 2.0 at [phone] to the ICF documentation checklist in the subjects paper source binder for further details of re-consent.   Subject confirmed that there was no change in contact information (e.g. address, telephone, email). Subject thanked for participation in research and contribution to science.   In this visit 07/04/2018 the subject will be evaluated by investigator named Dr. RaChase Caller. This research coordinator has verified that the investigator is uptodate with his/her training logs  During subjects physical exam he expressed to PI Dr. RaChase Callerhat he had been taking his IP on a daily basis. Later in the visit during verification that the subject held IP for today's visit, the subject confirmed he did not take IP because he ran out of IP 2 days prior. Accountability confirmed that the subjects last dose of IP was on July 01, 2018. Also during IP accountability it was noticed that the subject had ripped off the lid to the kit box and disposed of it. The subject was re-educated on not disposing of any part of the kit or it's contents and to please keep it all intact for return to the site at each visit. Subject verbalized understanding of this.    The subject was originally  scheduled for this visit on July 02, 2018 however that morning the subject called the site and stated he had not been feeling well and was not able to come in for this visit. Subject expressed that late on June 27, 2018 he ate a meatball sub that did not taste well to him. He states he later began to experience nausea, vomiting, and diarrhea that lasted until the early morning hours of July 02, 2018 and this is why he could not come to the set visit. At the time of the call the subject did not feel well enough to schedule the visit for the following day. So on July 03, 2018 upon arrival to the office this coordinator called the subject to schedule the missed visit. The subject could not come in for the visit until today, July 04, 2018 which resulted in an out of window visit. This will be documented as a protocol deviation. Refer to the PI's progress note for further documentation of the Adverse event that occurred.    All procedures were completed according to the above mentioned protocol. Fer to the subjects paper source binder for further documentation.   Signed by JeRandolph BingBS, CCMorrisvilleoordinator  [facility]  [place]:05 PM 07/04/2018

## 2018-07-04 NOTE — Patient Instructions (Signed)
According to protocol

## 2018-07-04 NOTE — Progress Notes (Signed)
OV 07/04/2018  Subjective:  Patient ID: Ian Hale, male , DOB: 07/05/1935 , age 83 y.o. , MRN: [id number] , ADDRESS: [address] [place] [id number]   07/04/2018 -   Chief Complaint  Patient presents with  . Research    Visit 4 Galactic Study   Title: GALACTIC-1 [facility]([employer] Study) is a Phase 2b, randomized, double-blind, parallel, placebo-controlled multicenter international study to evaluate evaluate the efficacy and safety of two doses (placebo v 3mg  v 10mg ) of TD139 administered once a day for 52 weeks as compared to placebo in subjects with Idiopathic Pulmonary Fibrosis. Key Primary End Point: Annual rate of decline in FVC expressed in mL over 52 weeks  Protocol #: GALACTIC-1, Clinical Trials #: [id number]. Sponsor: [email or web address]([place]   Description:Protocol Version V4.8 US_dated 05Jul2019  IB Version 8.0 dated 07Dec2018 [place] Main ICF Version Number: 6.1.0; Date: 02-Aug-2019IRB (central) 3.0 dated Jun 27, 2018.   Key Features of TD139 the study drug: a Galectin-3 inhibitor designed specifically to modulate the fibrogenic response to tissue injury. In multiple models of organ fibrosis, it has been demonstrated that Gal-3 is potently pro-fibrotic, modulating the activity of fibroblasts and macrophages in chronically injured tissues. It is hypothesized that increased Gal-3 expression during chronic injury leads to fibroblast activation via the aggregation of growth factor receptor clusters (such as TGF-), thereby amplifying profibrotic signal cascades.    [name] 83 y.o. -  Presents for Subject #002 Visit 4 Galactic-1.  Presents for this research visit.  He expresses continued interest in participating in the research protocol.  He is compliant with his research investigational medical product the inhaler.  He is also compliant with his standard of care nintedanib.  Other than his baseline mild diarrhea that is occasional which is documented in the  medical history because of nintedanib he does not have any other issues.  The only new issue is that on June 27, 2018 he had poorly tasting meatball sandwich.  Then on June 27, 2018 he started developing acute onset vomiting that lasted through Monday, July 02, 2018.  Therefore he missed his scheduled research visit.  He actually told us that he would not be able to make it over the phone.  The only possible day was today July 04, 2018.Marland Kitchen.  Therefore this visit is out of window and a protocol deviation.  At this point in time his vomiting is completely resolved.  The diagnosis is food poisoning.  It was moderate in intensity.  This will be an adverse event.  It is not a serious adverse event because there is no admission.  In terms of his respiratory health he is stable.  He feels that ever since starting nintedanib and entering the study there is no further decline in his respiratory status.  Noted that in January 2020 he is having upcoming cataract surgery 3 weeks apart in both eyes.  According to the research coordinator there is no contraindication to have this procedure while on the study.  Noted: We did re-consent him today for the our address change in the main protocol and optional study.  We did this before initiating any study procedures.     ROS - per HPI     Objective:  Exam as documented in the source document Assessment:       ICD-10-CM   1. Research subject Z00.6   2. IPF (idiopathic pulmonary fibrosis) (HCC) I69.629J84.112        Plan:  Patient Instructions  According to protocol     SIGNATURE    Dr. Kalman Shan, M.D., [name], [name] Pulmonary and Critical Care Medicine Research Investigator, [facility] @ [facility] Staff Physician, [facility] Center Director - Interstitial Lung Disease  Program  Pulmonary Fibrosis [facility] and [facility] @ [facility] [place], [place], [address]  Pager: [phone], If no answer or between  15:00h - 7:00h: call [phone] Telephone: [phone]  10:45 AM 07/04/2018

## 2018-07-05 ENCOUNTER — Encounter (HOSPITAL_COMMUNITY)
Admission: RE | Admit: 2018-07-05 | Discharge: 2018-07-05 | Disposition: A | Discharge status: Home / Self Care | Payer: Medicare Other | Source: Ambulatory Visit | Attending: Internal Medicine | Admitting: Internal Medicine

## 2018-07-05 DIAGNOSIS — J84112 Idiopathic pulmonary fibrosis: Secondary | ICD-10-CM

## 2018-07-05 NOTE — Progress Notes (Signed)
Daily Session Note  Patient Details  Name: Ian Hale MRN: [id number] Date of Birth: [birth year] Referring Provider:     Pulmonary Rehab Walk Test from 04/05/2018 in [place]  Referring Provider  Dr. Elsworth Soho      Encounter Date: 07/05/2018  Check In: Session Check In - 07/05/18 1351      Check-In   Supervising physician immediately available to respond to emergencies  [facility] immediately available    Physician(s)  Dr. Lonny Prude    Location  [facility]    Staff Present  Rosebud Poles, RN, BSN;Carlette Wilber Oliphant, RN, Bjorn Loser, MS, Exercise Physiologist;Zayde Stroupe Ysidro Evert, RN    Medication changes reported      No    Fall or balance concerns reported     No    Warm-up and Cool-down  Performed as group-led instruction    Resistance Training Performed  Yes    VAD Patient?  No    PAD/SET Patient?  No      Pain Assessment   Currently in Pain?  No/denies    Pain Score  0-No pain    Multiple Pain Sites  No       Capillary Blood Glucose: No results found for this or any previous visit (from the past 24 hour(s)).    Social History   Tobacco Use  Smoking Status Never Smoker  Smokeless Tobacco Never Used    Goals Met:  Exercise tolerated well No report of cardiac concerns or symptoms Strength training completed today  Goals Unmet:  Not Applicable  Comments: Service time is from 1330 to 1510    Dr. Rush Farmer is Medical Director for Pulmonary Rehab at [facility].

## 2018-07-05 NOTE — Progress Notes (Signed)
Pulmonary Individual Treatment Plan  Patient Details  Name: Ian Hale MRN: [id number] Date of Birth: [birth year] Referring Provider:     Pulmonary Rehab Walk Test from 04/05/2018 in [place]  Referring Provider  Dr. Elsworth Soho      Initial Encounter Date:    Pulmonary Rehab Walk Test from 04/05/2018 in [place]  Date  04/05/18      Visit Diagnosis: Idiopathic pulmonary fibrosis ([place])  Patient's Home Medications on Admission:   Current Outpatient Medications:  .  aspirin 81 MG tablet, Take 81 mg by mouth daily.  , Disp: , Rfl:  .  atorvastatin (LIPITOR) 80 MG tablet, TAKE 1 TABLET(80 MG) BY MOUTH DAILY, Disp: 90 tablet, Rfl: 2 .  bimatoprost (LUMIGAN) 0.01 % SOLN, Place 1 drop into both eyes at bedtime., Disp: , Rfl:  .  cyanocobalamin 2000 MCG tablet, Take 2,000 mcg by mouth 2 (two) times daily., Disp: , Rfl:  .  furosemide (LASIX) 20 MG tablet, TAKE 1 TABLET(20 MG) BY MOUTH DAILY, Disp: 90 tablet, Rfl: 3 .  metoprolol tartrate (LOPRESSOR) 25 MG tablet, TAKE 1/2 TABLET(12.5 MG) BY MOUTH TWICE DAILY, Disp: 90 tablet, Rfl: 3 .  nitroGLYCERIN (NITROSTAT) 0.4 MG SL tablet, Place 1 tablet (0.4 mg total) under the tongue every 5 (five) minutes as needed., Disp: 25 tablet, Rfl: 1 .  OFEV 150 MG CAPS, , Disp: , Rfl:  .  Omega-3 Fatty Acids (FISH OIL PO), Take 2 capsules by mouth. Pt takes 4087m, Disp: , Rfl:  .  potassium chloride SA (K-DUR,KLOR-CON) 20 MEQ tablet, Take 0.5 tablets (10 mEq total) by mouth daily., Disp: 45 tablet, Rfl: 2 .  saw palmetto 160 MG capsule, Take 160 mg by mouth daily. , Disp: , Rfl:   Past Medical History: Past Medical History:  Diagnosis Date  . Allergy   . BPH (benign prostatic hyperplasia)   . CAD (coronary artery disease)   . Cholesteatoma of left ear   . ED (erectile dysfunction)   . Glaucoma   . Glaucoma   . Hearing loss of both ears   . Hyperlipidemia   . Hypertension     Tobacco  Use: Social History   Tobacco Use  Smoking Status Never Smoker  Smokeless Tobacco Never Used    Labs: Recent Review Flowsheet Data    Labs for ITP Cardiac and Pulmonary Rehab Latest Ref Rng & Units 09/27/2011 10/09/2012 10/29/2013 02/23/2015 01/01/2016   Cholestrol 125 - 200 mg/dL 126 120 123 127 120(L)   LDLCALC <130 mg/dL 73 75 75 65 64   HDL >=40 mg/dL 33.10(L) 29.00(L) 32.60(L) 38.60(L) 34(L)   Trlycerides <150 mg/dL 102.0 80.0 77.0 116.0 108      Capillary Blood Glucose: No results found for: GLUCAP   Pulmonary Assessment Scores: Pulmonary Assessment Scores    Row Name 04/05/18 1628         ADL UCSD   ADL Phase  Entry       mMRC Score   mMRC Score  1        Pulmonary Function Assessment:   Exercise Target Goals: Exercise Program Goal: Individual exercise prescription set using results from initial 6 min walk test and THRR while considering  patient's activity barriers and safety.   Exercise Prescription Goal: Initial exercise prescription builds to 30-45 minutes a day of aerobic activity, 2-3 days per week.  Home exercise guidelines will be given to patient during program as part of exercise  prescription that the participant will acknowledge.  Activity Barriers & Risk Stratification:   6 Minute Walk: 6 Minute Walk    Row Name 04/05/18 1628         6 Minute Walk   Phase  Initial     Distance  900 feet     Walk Time  6 minutes     # of Rest Breaks  0     MPH  1.7     METS  2.3     RPE  12     Perceived Dyspnea   1     Resting HR  62 bpm     Resting BP  144/64     Resting Oxygen Saturation   92 %     Exercise Oxygen Saturation  during 6 min walk  84 %     Max Ex. HR  73 bpm     Max Ex. BP  140/62       Interval HR   1 Minute HR  71     2 Minute HR  92     3 Minute HR  73     4 Minute HR  73     5 Minute HR  71     6 Minute HR  71     Interval Heart Rate?  Yes       Interval Oxygen   Interval Oxygen?  Yes     Baseline Oxygen Saturation %  92 %      1 Minute Oxygen Saturation %  91 %     1 Minute Liters of Oxygen  0 L     2 Minute Oxygen Saturation %  90 %     2 Minute Liters of Oxygen  0 L     3 Minute Oxygen Saturation %  85 %     3 Minute Liters of Oxygen  0 L     4 Minute Oxygen Saturation %  86 %     4 Minute Liters of Oxygen  0 L     5 Minute Oxygen Saturation %  84 %     5 Minute Liters of Oxygen  0 L     6 Minute Oxygen Saturation %  84 %     6 Minute Liters of Oxygen  0 L        Oxygen Initial Assessment: Oxygen Initial Assessment - 04/05/18 1627      Initial 6 min Walk   Oxygen Used  None      Program Oxygen Prescription   Program Oxygen Prescription  E-Tanks;Continuous    Comments  Patient will need oxygen during Pulmonary Rehab. Patient desaturated to 84% during 6MWT. Will conduct titration first day of exercise.        Oxygen Re-Evaluation: Oxygen Re-Evaluation    Row Name 05/08/18 1649 06/06/18 0924 07/04/18 0756         Program Oxygen Prescription   Program Oxygen Prescription  E-Tanks;Continuous  E-Tanks;Continuous  E-Tanks;Continuous     Liters per minute  - 2-3  3  3      Comments  -  3 L on track, 2 L during seated exercise   3 L on track, 2 L during seated exercise        Home Oxygen   Home Oxygen Device  Home Concentrator Patient is waiting to POC delivery  Home Concentrator;Portable Concentrator  Home Concentrator;Portable Concentrator     Sleep Oxygen Prescription  None  None  None     Home Exercise Oxygen Prescription  None  Pulsed  Pulsed     Liters per minute  -  3  3     Home at Rest Exercise Oxygen Prescription  None  None  None     Compliance with Home Oxygen Use  No  Yes  Yes       Goals/Expected Outcomes   Short Term Goals  To learn and exhibit compliance with exercise, home and travel O2 prescription;To learn and understand importance of maintaining oxygen saturations>88%;To learn and demonstrate proper use of respiratory medications;To learn and understand importance of  monitoring SPO2 with pulse oximeter and demonstrate accurate use of the pulse oximeter.;To learn and demonstrate proper pursed lip breathing techniques or other breathing techniques.  To learn and exhibit compliance with exercise, home and travel O2 prescription;To learn and understand importance of maintaining oxygen saturations>88%;To learn and demonstrate proper use of respiratory medications;To learn and understand importance of monitoring SPO2 with pulse oximeter and demonstrate accurate use of the pulse oximeter.;To learn and demonstrate proper pursed lip breathing techniques or other breathing techniques.  To learn and exhibit compliance with exercise, home and travel O2 prescription;To learn and understand importance of maintaining oxygen saturations>88%;To learn and demonstrate proper use of respiratory medications;To learn and understand importance of monitoring SPO2 with pulse oximeter and demonstrate accurate use of the pulse oximeter.;To learn and demonstrate proper pursed lip breathing techniques or other breathing techniques.     Long  Term Goals  Exhibits compliance with exercise, home and travel O2 prescription;Verbalizes importance of monitoring SPO2 with pulse oximeter and return demonstration;Maintenance of O2 saturations>88%;Exhibits proper breathing techniques, such as pursed lip breathing or other method taught during program session;Compliance with respiratory medication;Demonstrates proper use of MDI's  Exhibits compliance with exercise, home and travel O2 prescription;Verbalizes importance of monitoring SPO2 with pulse oximeter and return demonstration;Maintenance of O2 saturations>88%;Exhibits proper breathing techniques, such as pursed lip breathing or other method taught during program session;Compliance with respiratory medication;Demonstrates proper use of MDI's  Exhibits compliance with exercise, home and travel O2 prescription;Verbalizes importance of monitoring SPO2 with  pulse oximeter and return demonstration;Maintenance of O2 saturations>88%;Exhibits proper breathing techniques, such as pursed lip breathing or other method taught during program session;Compliance with respiratory medication;Demonstrates proper use of MDI's     Goals/Expected Outcomes  understanding of the importance of home oxygen with exercise  understanding of the importance of home oxygen with exercise  understanding of the importance of home oxygen with exercise        Oxygen Discharge (Final Oxygen Re-Evaluation): Oxygen Re-Evaluation - 07/04/18 0756      Program Oxygen Prescription   Program Oxygen Prescription  E-Tanks;Continuous    Liters per minute  3    Comments  3 L on track, 2 L during seated exercise       Home Oxygen   Home Oxygen Device  Home Concentrator;Portable Concentrator    Sleep Oxygen Prescription  None    Home Exercise Oxygen Prescription  Pulsed    Liters per minute  3    Home at Rest Exercise Oxygen Prescription  None    Compliance with Home Oxygen Use  Yes      Goals/Expected Outcomes   Short Term Goals  To learn and exhibit compliance with exercise, home and travel O2 prescription;To learn and understand importance of maintaining oxygen saturations>88%;To learn and demonstrate proper use of respiratory medications;To learn and understand importance of monitoring SPO2 with pulse oximeter and demonstrate  accurate use of the pulse oximeter.;To learn and demonstrate proper pursed lip breathing techniques or other breathing techniques.    Long  Term Goals  Exhibits compliance with exercise, home and travel O2 prescription;Verbalizes importance of monitoring SPO2 with pulse oximeter and return demonstration;Maintenance of O2 saturations>88%;Exhibits proper breathing techniques, such as pursed lip breathing or other method taught during program session;Compliance with respiratory medication;Demonstrates proper use of MDI's    Goals/Expected Outcomes  understanding of  the importance of home oxygen with exercise       Initial Exercise Prescription: Initial Exercise Prescription - 04/05/18 1600      Date of Initial Exercise RX and Referring Provider   Date  04/05/18    Referring Provider  Dr. Elsworth Soho      Oxygen   Oxygen  Continuous    Liters  --   will titrate on first day     Bike   Level  0.4    Minutes  17      NuStep   Level  2    SPM  80    Minutes  17    METs  1.5      Track   Laps  10    Minutes  17      Prescription Details   Frequency (times per week)  2    Duration  Progress to 45 minutes of aerobic exercise without signs/symptoms of physical distress      Intensity   THRR 40-80% of Max Heartrate  56-111    Ratings of Perceived Exertion  11-13    Perceived Dyspnea  0-4      Progression   Progression  Continue to progress workloads to maintain intensity without signs/symptoms of physical distress.      Resistance Training   Training Prescription  Yes    Weight  BLUE BANDS    Reps  10-15       Perform Capillary Blood Glucose checks as needed.  Exercise Prescription Changes: Exercise Prescription Changes    Row Name 04/24/18 1600 05/08/18 1500 05/22/18 1500 06/05/18 1500 06/14/18 1611     Response to Exercise   Blood Pressure (Admit)  120/58  120/54  134/60  130/62  120/56   Blood Pressure (Exercise)  134/74  116/60  138/60  116/64  120/70   Blood Pressure (Exit)  112/68  102/78  120/60  100/60  120/66   Heart Rate (Admit)  69 bpm  65 bpm  57 bpm  61 bpm  58 bpm   Heart Rate (Exercise)  83 bpm  82 bpm  78 bpm  99 bpm  84 bpm   Heart Rate (Exit)  68 bpm  64 bpm  62 bpm  66 bpm  60 bpm   Oxygen Saturation (Admit)  94 %  96 %  97 %  95 %  95 %   Oxygen Saturation (Exercise)  90 %  88 %  93 %  88 %  91 %   Oxygen Saturation (Exit)  94 %  98 %  95 %  93 %  98 %   Rating of Perceived Exertion (Exercise)  13  13  13  14  13    Perceived Dyspnea (Exercise)  1  2  2  3  2    Duration  Progress to 45 minutes of aerobic  exercise without signs/symptoms of physical distress  Progress to 45 minutes of aerobic exercise without signs/symptoms of physical distress  Progress to 45 minutes of aerobic  exercise without signs/symptoms of physical distress  Continue with 45 min of aerobic exercise without signs/symptoms of physical distress.  Progress to 45 minutes of aerobic exercise without signs/symptoms of physical distress   Intensity  Other (comment) 40-80% of HRR  Other (comment) 40-80% of HRR  THRR unchanged  THRR unchanged  THRR unchanged     Progression   Progression  Continue to progress workloads to maintain intensity without signs/symptoms of physical distress.  Continue to progress workloads to maintain intensity without signs/symptoms of physical distress.  Continue to progress workloads to maintain intensity without signs/symptoms of physical distress.  Continue to progress workloads to maintain intensity without signs/symptoms of physical distress.  Continue to progress workloads to maintain intensity without signs/symptoms of physical distress.     Resistance Training   Training Prescription  Yes  Yes  Yes  Yes  Yes   Weight  blue bands  blue bands  blue bands  blue bands  blue bands   Reps  10-15  10-15  10-15  10-15  10-15   Time  10 Minutes  10 Minutes  10 Minutes  10 Minutes  10 Minutes     Interval Training   Interval Training  -  -  -  -  No     Oxygen   Oxygen  Continuous  Continuous  Continuous  Continuous  Continuous   Liters  2  2  2-3  2-3  2-3     Bike   Level  0.45  0.45  -  0.6  0.6   Minutes  17  17  -  17  17     NuStep   Level  3  3  3  4  4    SPM  80  80  80  80  80   Minutes  17  17  17  17  17    METs  2.1  2.8  2.1  2  2      Track   Laps  10  15  15  17   -   Minutes  17  17  17  17   -     Home Exercise Plan   Plans to continue exercise at  -  -  Home (comment)  -  -   Frequency  -  -  Add 2 additional days to program exercise sessions.  -  -   Row Name 07/03/18 1600               Response to Exercise   Blood Pressure (Admit)  102/60       Blood Pressure (Exercise)  132/66       Blood Pressure (Exit)  102/50       Heart Rate (Admit)  62 bpm       Heart Rate (Exercise)  82 bpm       Heart Rate (Exit)  63 bpm       Oxygen Saturation (Admit)  93 %       Oxygen Saturation (Exercise)  90 %       Oxygen Saturation (Exit)  94 %       Rating of Perceived Exertion (Exercise)  14       Perceived Dyspnea (Exercise)  2       Duration  Progress to 45 minutes of aerobic exercise without signs/symptoms of physical distress       Intensity  THRR unchanged         Progression  Progression  Continue to progress workloads to maintain intensity without signs/symptoms of physical distress.         Resistance Training   Training Prescription  Yes       Weight  blue bands       Reps  10-15       Time  10 Minutes         Interval Training   Interval Training  No         Oxygen   Oxygen  Continuous       Liters  2-3         Bike   Level  0.7       Minutes  17         NuStep   Level  4       SPM  80       Minutes  17       METs  2         Track   Laps  15       Minutes  17          Exercise Comments: Exercise Comments    Row Name 05/22/18 1549           Exercise Comments  home exercise complete           Exercise Goals and Review: Exercise Goals    [place] Name 04/04/18 1426             Exercise Goals   Increase Physical Activity  Yes       Intervention  Provide advice, education, support and counseling about physical activity/exercise needs.;Develop an individualized exercise prescription for aerobic and resistive training based on initial evaluation findings, risk stratification, comorbidities and participant's personal goals.       Expected Outcomes  Short Term: Attend rehab on a regular basis to increase amount of physical activity.;Long Term: Exercising regularly at least 3-5 days a week.;Long Term: Add in home exercise to make  exercise part of routine and to increase amount of physical activity.       Increase Strength and Stamina  Yes       Intervention  Provide advice, education, support and counseling about physical activity/exercise needs.;Develop an individualized exercise prescription for aerobic and resistive training based on initial evaluation findings, risk stratification, comorbidities and participant's personal goals.       Expected Outcomes  Short Term: Increase workloads from initial exercise prescription for resistance, speed, and METs.;Short Term: Perform resistance training exercises routinely during rehab and add in resistance training at home;Long Term: Improve cardiorespiratory fitness, muscular endurance and strength as measured by increased METs and functional capacity (6MWT)       Able to understand and use rate of perceived exertion (RPE) scale  Yes       Intervention  Provide education and explanation on how to use RPE scale       Expected Outcomes  Short Term: Able to use RPE daily in rehab to express subjective intensity level;Long Term:  Able to use RPE to guide intensity level when exercising independently       Able to understand and use Dyspnea scale  Yes       Intervention  Provide education and explanation on how to use Dyspnea scale       Expected Outcomes  Short Term: Able to use Dyspnea scale daily in rehab to express subjective sense of shortness of breath during exertion;Long Term: Able to use Dyspnea  scale to guide intensity level when exercising independently       Knowledge and understanding of Target Heart Rate Range (THRR)  Yes       Intervention  Provide education and explanation of THRR including how the numbers were predicted and where they are located for reference       Expected Outcomes  Short Term: Able to state/look up THRR;Long Term: Able to use THRR to govern intensity when exercising independently;Short Term: Able to use daily as guideline for intensity in rehab        Understanding of Exercise Prescription  Yes       Intervention  Provide education, explanation, and written materials on patient's individual exercise prescription       Expected Outcomes  Short Term: Able to explain program exercise prescription;Long Term: Able to explain home exercise prescription to exercise independently          Exercise Goals Re-Evaluation : Exercise Goals Re-Evaluation    Row Name 05/08/18 1650 06/06/18 0926 07/04/18 0757         Exercise Goal Re-Evaluation   Exercise Goals Review  Increase Physical Activity;Increase Strength and Stamina;Able to understand and use rate of perceived exertion (RPE) scale;Able to understand and use Dyspnea scale;Knowledge and understanding of Target Heart Rate Range (THRR);Understanding of Exercise Prescription  Increase Physical Activity;Increase Strength and Stamina;Able to understand and use rate of perceived exertion (RPE) scale;Able to understand and use Dyspnea scale;Knowledge and understanding of Target Heart Rate Range (THRR);Understanding of Exercise Prescription  Increase Physical Activity;Increase Strength and Stamina;Able to understand and use rate of perceived exertion (RPE) scale;Able to understand and use Dyspnea scale;Knowledge and understanding of Target Heart Rate Range (THRR);Understanding of Exercise Prescription     Comments  Patient is progressing in program. He has increase average METs from 2.0 to 2.4, increase laps from 10-15 laps (200 ft) in 15 minutes. Patient is waiting on POC delivery, Will discuss home exercise at that point. Will cont to monitor and motivate as able.  Pt continues to progress. Pt can now do 17 laps (1 lap=200 ft) in 15 minutes and his METs remain at 2.0-2.4. Have discussed with pt home exercise and the importance of adhering to oxygen prescription. Will continue to monitor and progress as able.   Pt continues to progress. Pt continues to do 15-17 laps around the track in 15 minutes (1 lap=200 ft). Pt  has increased his MET levels up to a moderate intensity (3.0). Pt will graduate next week and I will advise him to continue to exercise after the program is over for him. Will continue to monitor and progress as able.      Expected Outcomes  Through exercise at rehab and at home, patient will increase functional ability, have a decrease in sob with ADL's, and establish an exercise regimine at home.  Through exercise at rehab and at home, patient will increase functional ability, have a decrease in sob with ADL's, and establish an exercise regimine at home.  Through exercise at rehab and at home, patient will increase functional ability, have a decrease in sob with ADL's, and establish an exercise regimine at home.        Discharge Exercise Prescription (Final Exercise Prescription Changes): Exercise Prescription Changes - 07/03/18 1600      Response to Exercise   Blood Pressure (Admit)  102/60    Blood Pressure (Exercise)  132/66    Blood Pressure (Exit)  102/50    Heart Rate (Admit)  62  bpm    Heart Rate (Exercise)  82 bpm    Heart Rate (Exit)  63 bpm    Oxygen Saturation (Admit)  93 %    Oxygen Saturation (Exercise)  90 %    Oxygen Saturation (Exit)  94 %    Rating of Perceived Exertion (Exercise)  14    Perceived Dyspnea (Exercise)  2    Duration  Progress to 45 minutes of aerobic exercise without signs/symptoms of physical distress    Intensity  THRR unchanged      Progression   Progression  Continue to progress workloads to maintain intensity without signs/symptoms of physical distress.      Resistance Training   Training Prescription  Yes    Weight  blue bands    Reps  10-15    Time  10 Minutes      Interval Training   Interval Training  No      Oxygen   Oxygen  Continuous    Liters  2-3      Bike   Level  0.7    Minutes  17      NuStep   Level  4    SPM  80    Minutes  17    METs  2      Track   Laps  15    Minutes  17       Nutrition:  Target Goals:  Understanding of nutrition guidelines, daily intake of sodium <1548m, cholesterol <2019m calories 30% from fat and 7% or less from saturated fats, daily to have 5 or more servings of fruits and vegetables.  Biometrics:    Nutrition Therapy Plan and Nutrition Goals: Nutrition Therapy & Goals - 04/06/18 0746      Nutrition Therapy   Diet  heart healthy      Personal Nutrition Goals   Nutrition Goal  pt to identify and limit food sources of sodium, saturated fat, trans fat, refined carbohydrates    Personal Goal #2  identify food quantitites necessary to achieve wt loss of 1/2 -2# per week to a goal wt loss of 2.7-10.9 kg at graduation from pulmonary rehab    Personal Goal #3  describe the benefit of including fruits, vegetables, whole grains, and low-fat dairy products in a healthy meal plan      Intervention Plan   Intervention  Prescribe, educate and counsel regarding individualized specific dietary modifications aiming towards targeted core components such as weight, hypertension, lipid management, diabetes, heart failure and other comorbidities.    Expected Outcomes  Short Term Goal: Understand basic principles of dietary content, such as calories, fat, sodium, cholesterol and nutrients.;Long Term Goal: Adherence to prescribed nutrition plan.       Nutrition Assessments: Nutrition Assessments - 04/06/18 0749      Rate Your Plate Scores   Pre Score  42       Nutrition Goals Re-Evaluation:   Nutrition Goals Discharge (Final Nutrition Goals Re-Evaluation):   Psychosocial: Target Goals: Acknowledge presence or absence of significant depression and/or stress, maximize coping skills, provide positive support system. Participant is able to verbalize types and ability to use techniques and skills needed for reducing stress and depression.  Initial Review & Psychosocial Screening: Initial Psych Review & Screening - 04/04/18 1438      Initial Review   Current issues with   Current Stress Concerns    Source of Stress Concerns  Family    Comments  Cares for wife who is  25 year old.  Has respite care at times      [place]?  Yes      Barriers   Psychosocial barriers to participate in program  There are no identifiable barriers or psychosocial needs.;The patient should benefit from training in stress management and relaxation.      Screening Interventions   Expected Outcomes  Long Term goal: The participant improves quality of Life and PHQ9 Scores as seen by post scores and/or verbalization of changes;Short Term goal: Identification and review with participant of any Quality of Life or Depression concerns found by scoring the questionnaire.       Quality of Life Scores:  Scores of 19 and below usually indicate a poorer quality of life in these areas.  A difference of  2-3 points is a clinically meaningful difference.  A difference of 2-3 points in the total score of the Quality of Life Index has been associated with significant improvement in overall quality of life, self-image, physical symptoms, and general health in studies assessing change in quality of life.  PHQ-9: Recent Review Flowsheet Data    Depression screen [facility] 2/9 04/04/2018 02/23/2015   Decreased Interest 0 0   Down, Depressed, Hopeless 0  0   PHQ - 2 Score 0 0   Altered sleeping 0  -   Tired, decreased energy 0 -   Change in appetite 0 -   Feeling bad or failure about yourself  0 -   Trouble concentrating 1 -   Moving slowly or fidgety/restless 1 -   Suicidal thoughts 0 -   PHQ-9 Score 2 -   Difficult doing work/chores Not difficult at all -     Interpretation of Total Score  Total Score Depression Severity:  1-4 = Minimal depression, 5-9 = Mild depression, 10-14 = Moderate depression, 15-19 = Moderately severe depression, 20-27 = Severe depression   Psychosocial Evaluation and Intervention: Psychosocial Evaluation - 06/06/18 1251      Psychosocial  Evaluation & Interventions   Interventions  Stress management education    Comments  Pt has a realistic outlook on his future.  Pt participates in clinical trials in an effort to give back.  Pt attended the support group meeting next week for Pulmonary Fibrosis    Expected Outcomes  Pt will seek community resources to assit with his wifes care.  Pt will display healthy and positive coping skills    Continue Psychosocial Services   Follow up required by staff       Psychosocial Re-Evaluation: Psychosocial Re-Evaluation    [place] Name 04/12/18 1726 04/12/18 1727 04/12/18 1728 05/09/18 1945 06/06/18 1251     Psychosocial Re-Evaluation   Current issues with  None Identified  -  Current Stress Concerns  Current Stress Concerns  Current Stress Concerns   Comments  Pt is upbeat and positve.  Very knowledgeable about his disease process  -  wife is advanced age and requires 24/7 care  wife is advanced age and requires 24/7 care  wife is advanced age and requires 24/7 care   Expected Outcomes  -  Pt will continue to possess mental well being  -  Pt will continue to possess mental well being  Pt will continue to possess mental well being   Interventions  Relaxation education;Stress management education;Encouraged to attend Pulmonary Rehabilitation for the exercise  -  -  Relaxation education;Stress management education;Encouraged to attend Pulmonary Rehabilitation for the exercise  Relaxation education;Stress management  education;Encouraged to attend Pulmonary Rehabilitation for the exercise   Continue Psychosocial Services   Follow up required by staff  -  -  Follow up required by staff  Follow up required by staff   Comments  -  -  -  -  Cares for wife who is 33 year old.  Has respite care at times     Initial Review   Source of Stress Concerns  -  -  -  -  Family   [place] Name 07/03/18 [phone]             Psychosocial Re-Evaluation   Current issues with  Current Stress Concerns       Comments  wife is  advanced age and requires 24/7 care       Expected Outcomes  Pt will continue to possess mental well being       Interventions  Relaxation education;Stress management education;Encouraged to attend Pulmonary Rehabilitation for the exercise       Continue Psychosocial Services   Follow up required by staff       Comments  Cares for wife who is 65 year old.  Has respite care at times         Initial Review   Source of Stress Concerns  Family          Psychosocial Discharge (Final Psychosocial Re-Evaluation): Psychosocial Re-Evaluation - 07/03/18 0853      Psychosocial Re-Evaluation   Current issues with  Current Stress Concerns    Comments  wife is advanced age and requires 24/7 care    Expected Outcomes  Pt will continue to possess mental well being    Interventions  Relaxation education;Stress management education;Encouraged to attend Pulmonary Rehabilitation for the exercise    Continue Psychosocial Services   Follow up required by staff    Comments  Cares for wife who is 4 year old.  Has respite care at times      Initial Review   Source of Stress Concerns  Family       Education: Education Goals: Education classes will be provided on a weekly basis, covering required topics. Participant will state understanding/return demonstration of topics presented.  Learning Barriers/Preferences: Learning Barriers/Preferences - 04/04/18 1441      Learning Barriers/Preferences   Learning Preferences  Computer/Internet;Group Instruction;Individual Instruction;Written Material       Education Topics: Risk Factor Reduction:  -Group instruction that is supported by a PowerPoint presentation. Instructor discusses the definition of a risk factor, different risk factors for pulmonary disease, and how the heart and lungs work together.     Nutrition for Pulmonary Patient:  -Group instruction provided by PowerPoint slides, verbal discussion, and written materials to support subject matter.  The instructor gives an explanation and review of healthy diet recommendations, which includes a discussion on weight management, recommendations for fruit and vegetable consumption, as well as protein, fluid, caffeine, fiber, sodium, sugar, and alcohol. Tips for eating when patients are short of breath are discussed.   Pursed Lip Breathing:  -Group instruction that is supported by demonstration and informational handouts. Instructor discusses the benefits of pursed lip and diaphragmatic breathing and detailed demonstration on how to preform both.     Oxygen Safety:  -Group instruction provided by PowerPoint, verbal discussion, and written material to support subject matter. There is an overview of "What is Oxygen" and "Why do we need it".  Instructor also reviews how to create a safe environment for oxygen use, the importance of using  oxygen as prescribed, and the risks of noncompliance. There is a brief discussion on traveling with oxygen and resources the patient may utilize.   PULMONARY REHAB OTHER RESPIRATORY from 06/26/2018 in [place]  Date  04/26/18  Educator  EP  Instruction Review Code  1- Verbalizes Understanding      Oxygen Equipment:  -Group instruction provided by [employer] utilizing handouts, written materials, and Insurance underwriter.   PULMONARY REHAB OTHER RESPIRATORY from 06/26/2018 in [place]  Date  05/03/18  Educator  [employer]  Instruction Review Code  1- Verbalizes Understanding      Signs and Symptoms:  -Group instruction provided by written material and verbal discussion to support subject matter. Warning signs and symptoms of infection, stroke, and heart attack are reviewed and when to call the physician/911 reinforced. Tips for preventing the spread of infection discussed.   Advanced Directives:  -Group instruction provided by verbal instruction and written material to support  subject matter. Instructor reviews Advanced Directive laws and proper instruction for filling out document.   Pulmonary Video:  -Group video education that reviews the importance of medication and oxygen compliance, exercise, good nutrition, pulmonary hygiene, and pursed lip and diaphragmatic breathing for the pulmonary patient.   Exercise for the Pulmonary Patient:  -Group instruction that is supported by a PowerPoint presentation. Instructor discusses benefits of exercise, core components of exercise, frequency, duration, and intensity of an exercise routine, importance of utilizing pulse oximetry during exercise, safety while exercising, and options of places to exercise outside of rehab.     PULMONARY REHAB OTHER RESPIRATORY from 06/26/2018 in [place]  Date  05/10/18  Educator  molly  Instruction Review Code  1- Verbalizes Understanding      Pulmonary Medications:  -Verbally interactive group education provided by instructor with focus on inhaled medications and proper administration.   PULMONARY REHAB OTHER RESPIRATORY from 06/26/2018 in [place]  Date  06/26/18  Educator  Anderson Malta  Instruction Review Code  1- Actuary and Physiology of the Respiratory System and Intimacy:  -Group instruction provided by PowerPoint, verbal discussion, and written material to support subject matter. Instructor reviews respiratory cycle and anatomical components of the respiratory system and their functions. Instructor also reviews differences in obstructive and restrictive respiratory diseases with examples of each. Intimacy, Sex, and Sexuality differences are reviewed with a discussion on how relationships can change when diagnosed with pulmonary disease. Common sexual concerns are reviewed.   PULMONARY REHAB OTHER RESPIRATORY from 06/26/2018 in [place]  Date  05/31/18    Educator  RN  Instruction Review Code  2- Demonstrated Understanding      MD DAY -A group question and answer session with a medical doctor that allows participants to ask questions that relate to their pulmonary disease state.   PULMONARY REHAB OTHER RESPIRATORY from 06/26/2018 in [place]  Date  05/17/18  Educator  Dr. Nelda Marseille  Instruction Review Code  1- [place] -Group or individual verbal, written, or video instructions that support the educational goals of the pulmonary rehab program.   PULMONARY REHAB OTHER RESPIRATORY from 06/26/2018 in [place]  Date  06/14/18 [[facility]]  Educator  Carlette  Instruction Review Code  1- [employer]  Survival Tips:  -Group instruction provided by PowerPoint slides, verbal discussion, and written materials to support subject matter. The instructor gives patients tips, tricks, and techniques to help them not only survive but enjoy the holidays despite the onslaught of food that accompanies the holidays.   PULMONARY REHAB OTHER RESPIRATORY from 06/26/2018 in [place]  Date  06/07/18  Educator  -- [RD]  Instruction Review Code  1- Verbalizes Understanding      Knowledge Questionnaire Score:   Core Components/Risk Factors/Patient Goals at Admission: Personal Goals and Risk Factors at Admission - 04/04/18 1434      Core Components/Risk Factors/Patient Goals on Admission    Weight Management  Yes;Weight Loss    Intervention  Obesity: Provide education and appropriate resources to help participant work on and attain dietary goals.;Weight Management/Obesity: Establish reasonable short term and long term weight goals.;Weight Management: Provide education and appropriate resources to help participant work on and attain dietary goals.;Weight Management: Develop a combined  nutrition and exercise program designed to reach desired caloric intake, while maintaining appropriate intake of nutrient and fiber, sodium and fats, and appropriate energy expenditure required for the weight goal.    Admit Weight  207 lb 10.8 oz (94.2 kg)    Goal Weight: Short Term  200 lb (90.7 kg)    Goal Weight: Long Term  190 lb (86.2 kg)    Expected Outcomes  Short Term: Continue to assess and modify interventions until short term weight is achieved;Weight Loss: Understanding of general recommendations for a balanced deficit meal plan, which promotes 1-2 lb weight loss per week and includes a negative energy balance of [phone] kcal/d;Understanding recommendations for meals to include 15-35% energy as protein, 25-35% energy from fat, 35-60% energy from carbohydrates, less than 271m of dietary cholesterol, 20-35 gm of total fiber daily;Understanding of distribution of calorie intake throughout the day with the consumption of 4-5 meals/snacks    Improve shortness of breath with ADL's  Yes    Intervention  Provide education, individualized exercise plan and daily activity instruction to help decrease symptoms of SOB with activities of daily living.    Expected Outcomes  Short Term: Improve cardiorespiratory fitness to achieve a reduction of symptoms when performing ADLs;Long Term: Be able to perform more ADLs without symptoms or delay the onset of symptoms    Lipids  Yes    Intervention  Provide education and support for participant on nutrition & aerobic/resistive exercise along with prescribed medications to achieve LDL <71m HDL >4036m   Expected Outcomes  Short Term: Participant states understanding of desired cholesterol values and is compliant with medications prescribed. Participant is following exercise prescription and nutrition guidelines.;Long Term: Cholesterol controlled with medications as prescribed, with individualized exercise RX and with personalized nutrition plan. Value goals: LDL  < 58m43mDL > 40 mg.    Stress  Yes    Intervention  Offer individual and/or small group education and counseling on adjustment to heart disease, stress management and health-related lifestyle change. Teach and support self-help strategies.    Expected Outcomes  Short Term: Participant demonstrates changes in health-related behavior, relaxation and other stress management skills, ability to obtain effective social support, and compliance with psychotropic medications if prescribed.;Long Term: Emotional wellbeing is indicated by absence of clinically significant psychosocial distress or social isolation.   cares for 89 y64r old wife      Core Components/Risk Factors/Patient Goals Review:  Goals and Risk Factor Review    Row Name 04/12/18  1728 05/09/18 1945 06/06/18 1251 07/03/18 0856       Core Components/Risk Factors/Patient Goals Review   Personal Goals Review  Weight Management/Obesity;Develop more efficient breathing techniques such as purse lipped breathing and diaphragmatic breathing and practicing self-pacing with activity.;Stress;Increase knowledge of respiratory medications and ability to use respiratory devices properly.;Improve shortness of breath with ADL's  Weight Management/Obesity;Develop more efficient breathing techniques such as purse lipped breathing and diaphragmatic breathing and practicing self-pacing with activity.;Stress;Increase knowledge of respiratory medications and ability to use respiratory devices properly.;Improve shortness of breath with ADL's  Weight Management/Obesity;Develop more efficient breathing techniques such as purse lipped breathing and diaphragmatic breathing and practicing self-pacing with activity.;Stress;Increase knowledge of respiratory medications and ability to use respiratory devices properly.;Improve shortness of breath with ADL's  Weight Management/Obesity;Develop more efficient breathing techniques such as purse lipped breathing and diaphragmatic  breathing and practicing self-pacing with activity.;Stress;Increase knowledge of respiratory medications and ability to use respiratory devices properly.;Improve shortness of breath with ADL's    Review  Pt completed 1 exercise session.  Unable to assess pt progress.  Anticipate pt will show measurable progress within the next 30 day assessment  Pt completed 7 exercise sessions and 3 education classes.  Pt understands the importance of using his oxygen but has not yet started to use this at home.  Pt places it on for exercise only despite having to increase his oxygen therapy to 3L while ambulating. continue to encourage pt and remind him of the importance of maintaining oxygen saturation.  Pt weight is down 2.6 kg since starting on 9/19/  Pt rates 2 for dypnea.  Needs some verbal reminders for PLB. Pt workloads show increase to level 3 on nustep, averages 15 laps on the track and 06 on the airydyne. Pt stress level remains the same.  Pt wife health requires nearly constant supervision  by pt  Anticipate pt will show measurable progress within the next 30 day assessment  Pt completed ex15 exercise sessions and 7 education classes.  Pt understands the importance of using his oxygen but has not yet started to use this at home.  Pt places it on for exercise only despite having to increase his oxygen therapy to 3L while ambulating. continue to encourage pt and remind him of the importance of maintaining oxygen saturation.  Pt weight is down .9kg since starting on 9/19/  Pt rates 2 for dypnea.  Needs some verbal reminders for PLB.  Continue to reinforce. Pt workloads show increase to level 4 on nustep, averages 15 laps on the track in 15 minutes and 0.6 on the airydyne. Pt stress level remains the same.  Pt wife health requires nearly constant supervision  by pt  Anticipate pt will show measurable progress within the next 30 day assessment  Has attended 20 exercise sessions, has progressed well, level 4 on nustep, 13  laps on track, .7 on the airdyne.  He seems happy while exercising with [place] and feels he is much stronger than before beginning program    Expected Outcomes  See Admission Goals/Outcomes  See Admission Goals/Outcomes  See Admission Goals/Outcomes  See Admission Goals/Outcomes       Core Components/Risk Factors/Patient Goals at Discharge (Final Review):  Goals and Risk Factor Review - 07/03/18 0856      Core Components/Risk Factors/Patient Goals Review   Personal Goals Review  Weight Management/Obesity;Develop more efficient breathing techniques such as purse lipped breathing and diaphragmatic breathing and practicing self-pacing with activity.;Stress;Increase knowledge of respiratory medications and ability  to use respiratory devices properly.;Improve shortness of breath with ADL's    Review  Has attended 20 exercise sessions, has progressed well, level 4 on nustep, 13 laps on track, .7 on the airdyne.  He seems happy while exercising with [place] and feels he is much stronger than before beginning program    Expected Outcomes  See Admission Goals/Outcomes       ITP Comments: ITP Comments    Row Name 04/04/18 1401 04/12/18 1725 04/12/18 1730 05/09/18 1944 06/06/18 1250   ITP Comments  Dr, Manfred Arch, Medical Director Pulmonary Rehab  Dr, Manfred Arch, Medical Director Pulmonary Rehab  Dr, Manfred Arch, Medical Director Pulmonary Rehab  Dr, Manfred Arch, Medical Director Pulmonary Rehab  Dr, Manfred Arch, Medical Director Pulmonary Rehab      Comments: ITP REVIEW Pt is making expected progress toward pulmonary rehab goals after completing 21 sessions. Recommend continued exercise, life style modification, education, and utilization of breathing techniques to increase stamina and strength and decrease shortness of breath with exertion.

## 2018-07-05 NOTE — Progress Notes (Signed)
I have reviewed the pulmonary graduate packet with Ian Hale . Ian Hale is scheduled to graduate on 07/12/18. The patient was given an exercise prescription and stated that they were debating between joining a gym with silver sneakers or joining the pulmonary maintenance program.  Ian Maduroobert and I discussed how to progress their exercise prescription.  The patient stated that they understand the exercise prescription.  We reviewed exercise guidelines, target heart rate during exercise, RPE Scale, weather conditions, NTG use, endpoints for exercise, warmup and cool down.  The patient was also given information regarding the pulmonary maintenance program and given homework to complete and return before graduation. Patient is encouraged to come to me with any questions.

## 2018-07-10 ENCOUNTER — Encounter (HOSPITAL_COMMUNITY)
Admission: RE | Admit: 2018-07-10 | Discharge: 2018-07-10 | Disposition: A | Discharge status: Home / Self Care | Payer: Medicare Other | Source: Ambulatory Visit | Attending: Internal Medicine | Admitting: Internal Medicine

## 2018-07-10 DIAGNOSIS — J84112 Idiopathic pulmonary fibrosis: Secondary | ICD-10-CM | POA: Diagnosis not present

## 2018-07-10 NOTE — Progress Notes (Signed)
Daily Session Note  Patient Details  Name: Ian Hale MRN: [id number] Date of Birth: 09/28/1934 Referring Provider:     Pulmonary Rehab Walk Test from 04/05/2018 in [place]  Referring Provider  Dr. Elsworth Soho      Encounter Date: 07/10/2018  Check In: Session Check In - 07/10/18 1520      Check-In   Supervising physician immediately available to respond to emergencies  [facility] immediately available    Physician(s)  Dr. Nevada Crane    Location  [facility]    Staff Present  Joycelyn Man RN, Maxcine Ham, RN, BSN;Carlette Wilber Oliphant, RN, Roque Cash, RN    Medication changes reported      No    Fall or balance concerns reported     No    Tobacco Cessation  No Change    Warm-up and Cool-down  Performed as group-led instruction    Resistance Training Performed  Yes    VAD Patient?  No    PAD/SET Patient?  No      Pain Assessment   Currently in Pain?  No/denies    Pain Score  0-No pain    Multiple Pain Sites  No       Capillary Blood Glucose: No results found for this or any previous visit (from the past 24 hour(s)).    Social History   Tobacco Use  Smoking Status Never Smoker  Smokeless Tobacco Never Used    Goals Met:  Proper associated with RPD/PD & O2 Sat Exercise tolerated well Strength training completed today  Goals Unmet:  Not Applicable  Comments: Service time from 1330 to 1505.   Dr. Rush Farmer is Medical Director for Pulmonary Rehab at [facility].

## 2018-07-12 ENCOUNTER — Encounter (HOSPITAL_COMMUNITY)
Admission: RE | Admit: 2018-07-12 | Discharge: 2018-07-12 | Disposition: A | Discharge status: Home / Self Care | Payer: Medicare Other | Source: Ambulatory Visit | Attending: Internal Medicine | Admitting: Internal Medicine

## 2018-07-12 DIAGNOSIS — J84112 Idiopathic pulmonary fibrosis: Secondary | ICD-10-CM | POA: Diagnosis not present

## 2018-07-17 ENCOUNTER — Encounter (HOSPITAL_COMMUNITY): Payer: Medicare Other

## 2018-07-23 ENCOUNTER — Telehealth: Payer: Self-pay | Admitting: Internal Medicine

## 2018-07-23 NOTE — Telephone Encounter (Signed)
PA request received from [facility] requested: Ofev 150mg  CMM Key: [id number] PA request has been sent to plan, and a determination is expected within 3 days.   Routing to [place] for follow-up.

## 2018-07-24 NOTE — Telephone Encounter (Signed)
Received fax from [facility] stating that pt's OFEV has been approved through 07/25/19. Nothing further needed.

## 2018-08-02 ENCOUNTER — Encounter (INDEPENDENT_AMBULATORY_CARE_PROVIDER_SITE_OTHER): Payer: Medicare Other | Admitting: Adult Health

## 2018-08-02 ENCOUNTER — Encounter: Payer: Self-pay | Admitting: Adult Health

## 2018-08-02 ENCOUNTER — Encounter: Payer: Medicare Other | Admitting: *Deleted

## 2018-08-02 DIAGNOSIS — J84112 Idiopathic pulmonary fibrosis: Secondary | ICD-10-CM

## 2018-08-02 DIAGNOSIS — Z006 Encounter for examination for normal comparison and control in clinical research program: Secondary | ICD-10-CM

## 2018-08-02 NOTE — Research (Signed)
Title: GALACTIC-1 [facility]) is a Phase 2b, randomized, double-blind, parallel, placebo-controlled multicenter international study to evaluate evaluate the efficacy and safety of two doses (placebo v 3mg  v 10mg ) of TD139 administered once a day for 52 weeks as compared to placebo in subjects with Idiopathic Pulmonary Fibrosis. Key Primary End Point: Annual rate of decline in FVC expressed in mL over 52 weeks  Protocol #: GALACTIC-1, Clinical Trials #: [id number]. Sponsor: [email or web address], [place])  Astronomer / Radio producer note : This visit for Subject Or A Madding with DOB: 1934-09-16 on 08/02/2018 for the above protocol is Visit/Encounter # 5 and is for purpose of research . Subject expressed continued interest and consent in continuing as a study subject. Subject confirmed that there was no change in contact information (e.g. address, telephone, email). Subject thanked for participation in research and contribution to science.   In this visit 08/02/2018 the subject will be evaluated by investigator named Rubye Oaks, NP  . This research coordinator has verified that the investigator is uptodate with his/her training logs The CRC has confirmed that the note will be routed to PI by sub-I if possible .  This visit has been conducted out of window due to the following: The subjects  Visit 5 window was Jul 25, 2018-Jul 31, 2018. Subject was contacted on July 20, 2018 to schedule Visit 5.  Due to the PI and one sub-I being out of town during this time scheduling was limited to one sub-I. The subject also had very limited availability during this time window, only offering the afternoon of Jul 26, 2018 as an available date however the sub-I on staff was not available that afternoon. Another barrier to meeting this window was the subject had surgery to remove a cataract from his right eye scheduled for Jul 30, 2018 also making him unavailable on Jul 31, 2018 due to the recovery time  needed for the procedure.   Due to all of the reasons stated above the first available day to conduct Visit 5 was today Aug 02, 2018. The subject was reminded of the importance of meeting these windows. The subjects next visit, Visit 6, was scheduled prior to the subject leaving the site and is set for November 07, 2018.   All procedures were completed per the protocol. Study drug was administered after 12 noon due to the visit having to be conducted in the afternoon due to subject schedule. The subject is aware to take the study drug around same time each day, preferably before noon. Subject states understanding.  Refer to the subjects paper source binder for further documentation of the visit.   Signed by Carron Curie, BS,CCRC2 Clinical Research Coordinator [facility]  [place], [place] 5:28 PM 08/02/2018

## 2018-08-02 NOTE — Research (Signed)
Title: GALACTIC-1 ([employer] [facility]) is a Phase 2b, randomized, double-blind, parallel, placebo-controlled multicenter international study to evaluate evaluate the efficacy and safety of two doses (placebo v 3mg v 10mg) of TD139 administered once a day for 52 weeks as compared to placebo in subjects with Idiopathic Pulmonary Fibrosis. Key Primary End Point: Annual rate of decline in FVC expressed in mL over 52 weeks  Protocol #: GALACTIC-1, Clinical Trials #: [id number]. Sponsor: [email or web address] ([place], [place])   

## 2018-08-02 NOTE — Progress Notes (Signed)
@Patient  ID: Ian Hale, male    DOB: 1934/12/28, 84 y.o.   MRN: [id number]  Chief Complaint  Patient presents with  . Research    Visit 5 Galactic-1    Referring provider: Chilton Greathouse, MD  HPI: Maye Hides Gayleen [place] Study) is a Phase 2b, randomized, double-blind, parallel, placebo-controlled multicenter international study to evaluate evaluate the efficacy and safety of two doses (placebo v 3mg  v 10mg ) of TD139 administered once a day for 52 weeks as compared to placebo in subjects with Idiopathic Pulmonary Fibrosis. Key Primary End Point: Annual rate of decline in FVC expressed in mL over 52 weeks  Protocol #: GALACTIC-1, Clinical Trials #: [id number]. Sponsor: [email or web address] ([place], [place])  Key Features of TD139 the study drug: a Galectin-3 inhibitor designed specifically to modulate the fibrogenic response to tissue injury. In multiple models of organ fibrosis, it has been demonstrated that Gal-3 is potently pro-fibrotic, modulating the activity of fibroblasts and macrophages in chronically injured tissues. It is hypothesized that increased Gal-3 expression during chronic injury leads to fibroblast activation via the aggregation of growth factor receptor clusters (such as TGF-), thereby amplifying profibrotic signal cascades.  08/02/2018 Research Study : Visit #5 Galactic-1 Patient presents for a research visit #5.  He expresses continued interest in participating in the research protocol.  Patient says he feels that he is at his baseline.  He denies any complaints.  Says that he typically has been having some diarrhea in the past but this has been much improved since last visit.   Cataract surgery on 07/30/18 on right eye . Says it went well. No complaints . Plan for left eye cataract surgery 07/2718 .    No Known Allergies  Immunization History  Administered Date(s) Administered  . Influenza Whole 07/31/2007  . Influenza, High Dose Seasonal PF 09/22/2016, 05/18/2017  .  Influenza-Unspecified 06/24/2013, 05/14/2018  . Pneumococcal Conjugate-13 10/29/2013  . Pneumococcal Polysaccharide-23 07/25/2001, 07/31/2007  . Td 07/25/2001  . Tdap 10/04/2011  . Zoster 07/31/2008    Past Medical History:  Diagnosis Date  . Allergy   . BPH (benign prostatic hyperplasia)   . CAD (coronary artery disease)   . Cholesteatoma of left ear   . ED (erectile dysfunction)   . Glaucoma   . Glaucoma   . Hearing loss of both ears   . Hyperlipidemia   . Hypertension     Tobacco History: Social History   Tobacco Use  Smoking Status Never Smoker  Smokeless Tobacco Never Used   Counseling given: Not Answered   Outpatient Medications Prior to Visit  Medication Sig Dispense Refill  . aspirin 81 MG tablet Take 81 mg by mouth daily.      Marland Kitchen atorvastatin (LIPITOR) 80 MG tablet TAKE 1 TABLET(80 MG) BY MOUTH DAILY 90 tablet 2  . bimatoprost (LUMIGAN) 0.01 % SOLN Place 1 drop into both eyes at bedtime.    . cyanocobalamin 2000 MCG tablet Take 2,000 mcg by mouth 2 (two) times daily.    . furosemide (LASIX) 20 MG tablet TAKE 1 TABLET(20 MG) BY MOUTH DAILY 90 tablet 3  . metoprolol tartrate (LOPRESSOR) 25 MG tablet TAKE 1/2 TABLET(12.5 MG) BY MOUTH TWICE DAILY 90 tablet 3  . nitroGLYCERIN (NITROSTAT) 0.4 MG SL tablet Place 1 tablet (0.4 mg total) under the tongue every 5 (five) minutes as needed. 25 tablet 1  . OFEV 150 MG CAPS     . Omega-3 Fatty Acids (FISH OIL PO) Take 2 capsules by mouth. Pt takes 4000mg     .  potassium chloride SA (K-DUR,KLOR-CON) 20 MEQ tablet Take 0.5 tablets (10 mEq total) by mouth daily. 45 tablet 2  . saw palmetto 160 MG capsule Take 160 mg by mouth daily.      No facility-administered medications prior to visit.      Review of Systems:    Physical Exam    GEN: A/Ox3; pleasant , NAD, elderly    HEENT:  [place]/AT, , NOSE-clear, THROAT-clear, no lesions, no postnasal drip or exudate noted.   NECK:  Supple w/ fair ROM; no JVD; normal carotid  impulses w/o bruits; no thyromegaly or nodules palpated; no lymphadenopathy.    RESP  Faint Bibasilar crackles  no dullness to percussion  CARD:  RRR, no m/r/g, no peripheral edema, pulses intact, no cyanosis or clubbing. S/p CABG -midline scar   GI:   Soft & nt; nml bowel sounds; no organomegaly or masses detected.   Musco: Warm bil, no deformities or joint swelling noted.   Neuro: alert, no focal deficits noted.    Skin: Warm, no lesions or rashes    Lab Results:  CBC  BNP No results found for: BNP  ProBNP  Imaging: No results found.    PFT Results Latest Ref Rng & Units 04/11/2018 03/16/2018 12/05/2017 09/12/2017 08/14/2017 04/28/2017  FVC-Pre L - 2.29 2.73 2.91 2.96 3.25  FVC-Predicted Pre % - 64 74 79 80 88  FVC-Post L - - - - - 3.25  FVC-Predicted Post % - - - - - 88  Pre FEV1/FVC % % - 81 80 78 84 78  Post FEV1/FCV % % - - - - - 77  FEV1-Pre L - 1.85 2.17 2.28 2.48 2.54  FEV1-Predicted Pre % - 74 84 88 95 98  FEV1-Post L - - - - - 2.50  DLCO UNC% % 37 43 48 45 45 50  DLCO COR %Predicted % 75 84 83 70 68 78  TLC L - - - - - 6.31  TLC % Predicted % - - - - - 94  RV % Predicted % - - - - - 119    No results found for: NITRICOXIDE      Assessment & Plan:   IPF (idiopathic pulmonary fibrosis) (HCC) Appears stable  Follow up as planned in office and As needed    Research study patient Continue per study protocol      Rubye Oaks, NP 08/02/2018

## 2018-08-02 NOTE — Assessment & Plan Note (Signed)
Continue per study protocol 

## 2018-08-02 NOTE — Assessment & Plan Note (Signed)
Appears stable  Follow up as planned in office and As needed

## 2018-08-29 ENCOUNTER — Encounter (HOSPITAL_COMMUNITY): Payer: Self-pay | Admitting: *Deleted

## 2018-08-29 NOTE — Progress Notes (Signed)
I called Ian Hale today at his request to discuss the Outpt. Pulmonary Maintenance program. He has had his cataract surgery and is doing well. Ian Hale states that he is having some issues with having care for his wife. Ian Hale states that he will call me if his situation changes and he can work out being able to come to the program. I encouraged him to call me if I can be of any further assistance to him.

## 2018-08-31 NOTE — Addendum Note (Signed)
Encounter addended by: Enid Skeens, RD on: 08/31/2018 9:33 AM  Actions taken: Flowsheet data copied forward, Visit Navigator Flowsheet section accepted

## 2018-09-27 ENCOUNTER — Telehealth: Payer: Self-pay | Admitting: *Deleted

## 2018-09-27 NOTE — Telephone Encounter (Signed)
Received office note from Alisia Ferrari, [place]. I reading ov note looks like pt is to having surgery. Left message to confirm this as well do we need to hold ASA and if so how long. Asked for a call back to Okey Regal  [phone].

## 2018-09-27 NOTE — Telephone Encounter (Signed)
Follow up    Ian Hale is returning call to [place]. Please call back  [phone]

## 2018-10-03 NOTE — Telephone Encounter (Signed)
° ° °  ° °  [facility] Pre-operative Risk Assessment    Request for surgical clearance:  1. What type of surgery is being performed? Direct brow w/ scar revision, bilateral upper eyelid entropion repair  2. When is this surgery scheduled? 11/07/18  3. What type of clearance is required (medical clearance vs. Pharmacy clearance to hold med vs. Both)?BOTH  4. Are there any medications that need to be held prior to surgery and how long? ASA  5. Practice name and name of physician performing surgery?   6. What is your office phone number [phone]   7.   What is your office fax number [phone]  8.   Anesthesia type (None, local, MAC, general) ? [place] 10/03/2018, 3:10 PM  _________________________________________________________________   (provider comments below)

## 2018-10-03 NOTE — Telephone Encounter (Signed)
Dr. Bynum Bellows to hold ASA for 5-7 days for plastic surgery?

## 2018-10-03 NOTE — Telephone Encounter (Signed)
I returned call to [place] at [facility] in regards to needing pre op clearance. Lmom to call back to discuss further as to procedure, anesthesia, meds to be held, etc.

## 2018-10-07 NOTE — Telephone Encounter (Signed)
OK to hold ASA for 5-7 days prior to plastic surgery

## 2018-10-08 NOTE — Telephone Encounter (Signed)
   Primary Cardiologist: Mertie Moores, MD  Chart reviewed as part of pre-operative protocol coverage. Patient was contacted 10/08/2018 in reference to pre-operative risk assessment for pending surgery as outlined below.  Ian Hale was last seen on 04/19/2018 by Dr. Acie Fredrickson.  Since that day, Ian Hale has done well from cardiac perspective.  Therefore, based on ACC/AHA guidelines, the patient would be at acceptable risk for the planned procedure without further cardiovascular testing.   I will route this recommendation to the requesting party via Epic fax function and remove from pre-op pool.  Please call with questions. Per Dr. Acie Fredrickson, patient may hold aspirin for 7 days prior to the surgery and restart after the surgery as soon as possible at the surgeon's discretion. I am more worried about the patient from pulmonary perspective than cardiac perspective since he has idiopathic pulmonary fibrosis and has been on 2L oxygen 24/7 at home. Will forward to Dr. Chase Caller to double check with him to see if any issue receiving MAC during surgery.     [place], [place] 10/08/2018, 10:23 AM

## 2018-10-08 NOTE — Progress Notes (Signed)
Discharge Progress Report  Patient Details  Name: Ian Hale MRN: [id number] Date of Birth: 03/15/1935 Referring Provider:     Pulmonary Rehab Walk Test from 04/05/2018 in [facility]  Referring Provider  Dr. Elsworth Soho       Number of Visits: 24 Reason for Discharge:  Patient reached a stable level of exercise. Patient independent in their exercise. Patient has met program and personal goals.  Smoking History:  Social History   Tobacco Use  Smoking Status Never Smoker  Smokeless Tobacco Never Used    Diagnosis:  Idiopathic pulmonary fibrosis ([place])  ADL UCSD: Pulmonary Assessment Scores    Row Name 07/10/18 1641 07/10/18 1643 10/08/18 1357     ADL UCSD   ADL Phase  Exit  Entry  Exit   SOB Score total  48  68  48     CAT Score   CAT Score  post 17  pre 20  post 17      Initial Exercise Prescription:   Discharge Exercise Prescription (Final Exercise Prescription Changes): Exercise Prescription Changes - 07/03/18 1600      Response to Exercise   Blood Pressure (Admit)  102/60    Blood Pressure (Exercise)  132/66    Blood Pressure (Exit)  102/50    Heart Rate (Admit)  62 bpm    Heart Rate (Exercise)  82 bpm    Heart Rate (Exit)  63 bpm    Oxygen Saturation (Admit)  93 %    Oxygen Saturation (Exercise)  90 %    Oxygen Saturation (Exit)  94 %    Rating of Perceived Exertion (Exercise)  14    Perceived Dyspnea (Exercise)  2    Duration  Progress to 45 minutes of aerobic exercise without signs/symptoms of physical distress    Intensity  THRR unchanged      Progression   Progression  Continue to progress workloads to maintain intensity without signs/symptoms of physical distress.      Resistance Training   Training Prescription  Yes    Weight  blue bands    Reps  10-15    Time  10 Minutes      Interval Training   Interval Training  No      Oxygen   Oxygen  Continuous    Liters  2-3      Bike   Level  0.7    Minutes  17       NuStep   Level  4    SPM  80    Minutes  17    METs  2      Track   Laps  15    Minutes  17       Functional Capacity: 6 Minute Walk    Row Name 07/12/18 1616         6 Minute Walk   Phase  Discharge     Distance  1200 feet     Distance Feet Change  300 ft     Walk Time  5.5 minutes     # of Rest Breaks  1 30 seconds standing due to desaturation     MPH  2.27     METS  2.76     RPE  15     Perceived Dyspnea   2     Resting HR  65 bpm     Resting BP  116/72     Resting Oxygen Saturation   91 %  Exercise Oxygen Saturation  during 6 min walk  84 %     Max Ex. HR  81 bpm     Max Ex. BP  138/68     2 Minute Post BP  110/68       Interval HR   1 Minute HR  68     2 Minute HR  75     3 Minute HR  76     4 Minute HR  69     5 Minute HR  76     6 Minute HR  81     2 Minute Post HR  62     Interval Heart Rate?  Yes       Interval Oxygen   Interval Oxygen?  Yes     Baseline Oxygen Saturation %  91 %     1 Minute Oxygen Saturation %  90 %     1 Minute Liters of Oxygen  3 L     2 Minute Oxygen Saturation %  85 %     2 Minute Liters of Oxygen  3 L     3 Minute Oxygen Saturation %  84 %     3 Minute Liters of Oxygen  3 L     4 Minute Oxygen Saturation %  89 %     4 Minute Liters of Oxygen  4 L     5 Minute Oxygen Saturation %  85 %     5 Minute Liters of Oxygen  4 L     6 Minute Oxygen Saturation %  84 %     6 Minute Liters of Oxygen  4 L     2 Minute Post Oxygen Saturation %  95 %     2 Minute Post Liters of Oxygen  2 L        Psychological, QOL, Others - Outcomes: PHQ 2/9: Depression screen [facility] 2/9 07/12/2018 04/04/2018 02/23/2015  Decreased Interest 0 0 0  Down, Depressed, Hopeless 0 0 0  PHQ - 2 Score 0 0 0  Altered sleeping 3 0 -  Tired, decreased energy 0 0 -  Change in appetite 0 0 -  Feeling bad or failure about yourself  0 0 -  Trouble concentrating 0 1 -  Moving slowly or fidgety/restless 0 1 -  Suicidal thoughts 0 0 -  PHQ-9 Score 3 2 -   Difficult doing work/chores Not difficult at all Not difficult at all -    Quality of Life:   Personal Goals: Goals established at orientation with interventions provided to work toward goal.    Personal Goals Discharge: Goals and Risk Factor Review    Row Name 04/12/18 1728 05/09/18 1945 06/06/18 1251 07/03/18 0856 07/12/18 1458     Core Components/Risk Factors/Patient Goals Review   Personal Goals Review  Weight Management/Obesity;Develop more efficient breathing techniques such as purse lipped breathing and diaphragmatic breathing and practicing self-pacing with activity.;Stress;Increase knowledge of respiratory medications and ability to use respiratory devices properly.;Improve shortness of breath with ADL's  Weight Management/Obesity;Develop more efficient breathing techniques such as purse lipped breathing and diaphragmatic breathing and practicing self-pacing with activity.;Stress;Increase knowledge of respiratory medications and ability to use respiratory devices properly.;Improve shortness of breath with ADL's  Weight Management/Obesity;Develop more efficient breathing techniques such as purse lipped breathing and diaphragmatic breathing and practicing self-pacing with activity.;Stress;Increase knowledge of respiratory medications and ability to use respiratory devices properly.;Improve shortness of breath with ADL's  Weight Management/Obesity;Develop more  efficient breathing techniques such as purse lipped breathing and diaphragmatic breathing and practicing self-pacing with activity.;Stress;Increase knowledge of respiratory medications and ability to use respiratory devices properly.;Improve shortness of breath with ADL's  -   Review  Pt completed 1 exercise session.  Unable to assess pt progress.  Anticipate pt will show measurable progress within the next 30 day assessment  Pt completed 7 exercise sessions and 3 education classes.  Pt understands the importance of using his oxygen but  has not yet started to use this at home.  Pt places it on for exercise only despite having to increase his oxygen therapy to 3L while ambulating. continue to encourage pt and remind him of the importance of maintaining oxygen saturation.  Pt weight is down 2.6 kg since starting on 9/19/  Pt rates 2 for dypnea.  Needs some verbal reminders for PLB. Pt workloads show increase to level 3 on nustep, averages 15 laps on the track and 06 on the airydyne. Pt stress level remains the same.  Pt wife health requires nearly constant supervision  by pt  Anticipate pt will show measurable progress within the next 30 day assessment  Pt completed ex15 exercise sessions and 7 education classes.  Pt understands the importance of using his oxygen but has not yet started to use this at home.  Pt places it on for exercise only despite having to increase his oxygen therapy to 3L while ambulating. continue to encourage pt and remind him of the importance of maintaining oxygen saturation.  Pt weight is down .9kg since starting on 9/19/  Pt rates 2 for dypnea.  Needs some verbal reminders for PLB.  Continue to reinforce. Pt workloads show increase to level 4 on nustep, averages 15 laps on the track in 15 minutes and 0.6 on the airydyne. Pt stress level remains the same.  Pt wife health requires nearly constant supervision  by pt  Anticipate pt will show measurable progress within the next 30 day assessment  Has attended 20 exercise sessions, has progressed well, level 4 on nustep, 13 laps on track, .7 on the airdyne.  He seems happy while exercising with [place] and feels he is much stronger than before beginning program  Is graduating from pulmonary rehab today and will enroll in maintenance pulmonary rehab after he has 2 cataract surgeries in January 2020   Expected Outcomes  See Admission Goals/Outcomes  See Admission Goals/Outcomes  See Admission Goals/Outcomes  See Admission Goals/Outcomes  -   [place] Name 10/08/18 1353              Core Components/Risk Factors/Patient Goals Review   Personal Goals Review  Weight Management/Obesity;Develop more efficient breathing techniques such as purse lipped breathing and diaphragmatic breathing and practicing self-pacing with activity.;Stress;Increase knowledge of respiratory medications and ability to use respiratory devices properly.;Improve shortness of breath with ADL's       Review  Is graduating from pulmonary rehab today and will enroll in maintenance pulmonary rehab after he has 2 cataract surgeries in January 2020       Expected Outcomes  Pt made significant progress and has the tools to be sucessful in meeting program and personal goals.          Exercise Goals and Review:   Exercise Goals Re-Evaluation: Exercise Goals Re-Evaluation    Row Name 05/08/18 1650 06/06/18 0926 07/04/18 0757         Exercise Goal Re-Evaluation   Exercise Goals Review  Increase Physical Activity;Increase Strength and  Stamina;Able to understand and use rate of perceived exertion (RPE) scale;Able to understand and use Dyspnea scale;Knowledge and understanding of Target Heart Rate Range (THRR);Understanding of Exercise Prescription  Increase Physical Activity;Increase Strength and Stamina;Able to understand and use rate of perceived exertion (RPE) scale;Able to understand and use Dyspnea scale;Knowledge and understanding of Target Heart Rate Range (THRR);Understanding of Exercise Prescription  Increase Physical Activity;Increase Strength and Stamina;Able to understand and use rate of perceived exertion (RPE) scale;Able to understand and use Dyspnea scale;Knowledge and understanding of Target Heart Rate Range (THRR);Understanding of Exercise Prescription     Comments  Patient is progressing in program. He has increase average METs from 2.0 to 2.4, increase laps from 10-15 laps (200 ft) in 15 minutes. Patient is waiting on POC delivery, Will discuss home exercise at that point. Will cont to monitor and  motivate as able.  Pt continues to progress. Pt can now do 17 laps (1 lap=200 ft) in 15 minutes and his METs remain at 2.0-2.4. Have discussed with pt home exercise and the importance of adhering to oxygen prescription. Will continue to monitor and progress as able.   Pt continues to progress. Pt continues to do 15-17 laps around the track in 15 minutes (1 lap=200 ft). Pt has increased his MET levels up to a moderate intensity (3.0). Pt will graduate next week and I will advise him to continue to exercise after the program is over for him. Will continue to monitor and progress as able.      Expected Outcomes  Through exercise at rehab and at home, patient will increase functional ability, have a decrease in sob with ADL's, and establish an exercise regimine at home.  Through exercise at rehab and at home, patient will increase functional ability, have a decrease in sob with ADL's, and establish an exercise regimine at home.  Through exercise at rehab and at home, patient will increase functional ability, have a decrease in sob with ADL's, and establish an exercise regimine at home.        Nutrition & Weight - Outcomes:    Nutrition: Nutrition Therapy & Goals - 08/31/18 0931      Nutrition Therapy   Diet  heart healthy      Personal Nutrition Goals   Nutrition Goal  pt to identify and limit food sources of sodium, saturated fat, trans fat, refined carbohydrates      Intervention Plan   Intervention  Prescribe, educate and counsel regarding individualized specific dietary modifications aiming towards targeted core components such as weight, hypertension, lipid management, diabetes, heart failure and other comorbidities.    Expected Outcomes  Short Term Goal: Understand basic principles of dietary content, such as calories, fat, sodium, cholesterol and nutrients.;Long Term Goal: Adherence to prescribed nutrition plan.       Nutrition Discharge: Nutrition Assessments - 08/31/18 0933      Rate  Your Plate Scores   Pre Score  42    Post Score  36       Education Questionnaire Score: Knowledge Questionnaire Score - 07/10/18 1643      Knowledge Questionnaire Score   Pre Score  14/18       Goals reviewed with patient. Cherre Huger, BSN Cardiac and Training and development officer

## 2018-10-08 NOTE — Addendum Note (Signed)
Encounter addended by: Chelsea Aus, RN on: 10/08/2018 2:07 PM  Actions taken: Episode resolved, Flowsheet data copied forward, Visit Navigator Flowsheet section accepted, Clinical Note Signed

## 2018-10-08 NOTE — Telephone Encounter (Signed)
Manually faxed clearance to the requesting office of [facility]. Called and spoke with Ginger and she confirmed that the letter was received.

## 2018-10-09 ENCOUNTER — Telehealth: Payer: Self-pay | Admitting: Internal Medicine

## 2018-10-09 NOTE — Telephone Encounter (Signed)
Irving Burton  Please call patient Ian Hale and see if he wants appearance for some kind of a facial surgery mid April 2020.  Also please ask him how important this is.  If possible this should be postponed into the summer 2020 given the fact there is coronavirus 19 outbreak going on

## 2018-10-10 ENCOUNTER — Telehealth: Payer: Self-pay | Admitting: Physician Assistant

## 2018-10-10 NOTE — Telephone Encounter (Signed)
Contacted patient regarding upcoming routine office visit in light of ongoing COVID-19 pandemic.  The patient is not having any new or worsening symptoms or concerns.  He is agreeable to postponing the upcoming appointment until the COVID-19 outbreak is contained.  He has noted some elevated BPs.  Lisinopril was stopped last year. I asked him to track his BP for a couple weeks and send me the readings via MyChart.   PLAN:  1. Appt for 10/15/2018 was canceled. 2. Please arrange a follow up appointment in 6 weeks with Dr. Elease Hashimoto or Tereso Newcomer. Tereso Newcomer, PA-C  10/10/2018 5:10 PM

## 2018-10-12 NOTE — Telephone Encounter (Signed)
Called and spoke with pt in regards to the facial surgery and stated to him that MR wanted to know if it was urgent for the surgery to be done in mid April or if it could be postponed until summer. Per pt, he said that it could wait until summer but pt wants MR to call the doctor who was going to do the surgery to discuss this with him.  Dr. Etta Quill at [phone] is who needs to be called by MR. Routing back to MR so he can call Dr. Dimas Millin.

## 2018-10-15 ENCOUNTER — Ambulatory Visit: Payer: Medicare Other | Admitting: Physician Assistant

## 2018-10-16 ENCOUNTER — Ambulatory Visit: Payer: Medicare Other | Admitting: Physician Assistant

## 2018-10-19 NOTE — Telephone Encounter (Signed)
Left message for MD but he never called back . With all COVID stuff I recommend he just be at home, and send someone for groceries if he can and do hygiene, keep up research vsiits.   He should consider oral surgery in late summer

## 2018-10-19 NOTE — Telephone Encounter (Signed)
Called and spoke with patient regarding results.  Informed the patient of results and recommendations today. Pt verbalized understanding and denied any questions or concerns at this time.  Pt advised that he will stay home and isolated with [phone] and reschedule oral surgery for late summer Nothing further needed.

## 2018-10-26 ENCOUNTER — Telehealth: Payer: Self-pay | Admitting: Physician Assistant

## 2018-10-26 NOTE — Telephone Encounter (Signed)
New message:   Patient stated someone call him concerning appt.

## 2018-10-26 NOTE — Telephone Encounter (Signed)
Follow up ° °Patient is returning call about appt. °

## 2018-10-31 ENCOUNTER — Telehealth: Payer: Self-pay | Admitting: *Deleted

## 2018-10-31 NOTE — Telephone Encounter (Signed)
Please call patient to arrange telemedicine visit. Tereso Newcomer, PA-C    10/31/2018 12:57 PM

## 2018-10-31 NOTE — Telephone Encounter (Signed)
   CONSENT FOR TELE-HEALTH VISIT  I hereby voluntarily request, consent and authorize [facility] and its employed or contracted physicians, physician assistants, nurse practitioners or other licensed health care professionals (the Practitioner), to provide me with telemedicine health care services (the "Services") as deemed necessary by the treating Practitioner. I acknowledge and consent to receive the Services by the Practitioner via telemedicine. I understand that the telemedicine visit will involve communicating with the Practitioner through live audiovisual communication technology and the disclosure of certain medical information by electronic transmission. I acknowledge that I have been given the opportunity to request an in-person assessment or other available alternative prior to the telemedicine visit and am voluntarily participating in the telemedicine visit.  I understand that I have the right to withhold or withdraw my consent to the use of telemedicine in the course of my care at any time, without affecting my right to future care or treatment, and that the Practitioner or I may terminate the telemedicine visit at any time. I understand that I have the right to inspect all information obtained and/or recorded in the course of the telemedicine visit and may receive copies of available information for a reasonable fee.  I understand that some of the potential risks of receiving the Services via telemedicine include:  . Delay or interruption in medical evaluation due to technological equipment failure or disruption; . Information transmitted may not be sufficient (e.g. poor resolution of images) to allow for appropriate medical decision making by the Practitioner; and/or  . In rare instances, security protocols could fail, causing a breach of personal health information.  Furthermore, I acknowledge that it is my responsibility to provide information about my medical history, conditions and  care that is complete and accurate to the best of my ability. I acknowledge that Practitioner's advice, recommendations, and/or decision may be based on factors not within their control, such as incomplete or inaccurate data provided by me or distortions of diagnostic images or specimens that may result from electronic transmissions. I understand that the practice of medicine is not an exact science and that Practitioner makes no warranties or guarantees regarding treatment outcomes. I acknowledge that I will receive a copy of this consent concurrently upon execution via email to the email address I last provided but may also request a printed copy by calling the office of [facility].    I understand that my insurance will be billed for this visit.   I have read or had this consent read to me. . I understand the contents of this consent, which adequately explains the benefits and risks of the Services being provided via telemedicine.  . I have been provided ample opportunity to ask questions regarding this consent and the Services and have had my questions answered to my satisfaction. . I give my informed consent for the services to be provided through the use of telemedicine in my medical care  By participating in this telemedicine visit I agree to the above. Please be sure to join your visit at least 10 minutes before your scheduled time.           

## 2018-11-06 ENCOUNTER — Telehealth (INDEPENDENT_AMBULATORY_CARE_PROVIDER_SITE_OTHER): Payer: Medicare Other | Admitting: Physician Assistant

## 2018-11-06 ENCOUNTER — Other Ambulatory Visit: Payer: Self-pay

## 2018-11-06 ENCOUNTER — Encounter: Payer: Self-pay | Admitting: Physician Assistant

## 2018-11-06 VITALS — BP 122/64 | HR 69 | Ht 67.0 in | Wt 192.0 lb

## 2018-11-06 DIAGNOSIS — E782 Mixed hyperlipidemia: Secondary | ICD-10-CM | POA: Diagnosis not present

## 2018-11-06 DIAGNOSIS — I2510 Atherosclerotic heart disease of native coronary artery without angina pectoris: Secondary | ICD-10-CM | POA: Diagnosis not present

## 2018-11-06 DIAGNOSIS — J84112 Idiopathic pulmonary fibrosis: Secondary | ICD-10-CM

## 2018-11-06 DIAGNOSIS — I10 Essential (primary) hypertension: Secondary | ICD-10-CM | POA: Diagnosis not present

## 2018-11-06 DIAGNOSIS — Z7189 Other specified counseling: Secondary | ICD-10-CM

## 2018-11-06 NOTE — Patient Instructions (Signed)
Medication Instructions:  Take Metoprolol once daily for 3 days, then stop taking it.  If you need a refill on your cardiac medications before your next appointment, please call your pharmacy.   Lab work: None   If you have labs (blood work) drawn today and your tests are completely normal, you will receive your results only by: Marland Kitchen MyChart Message (if you have MyChart) OR . A paper copy in the mail If you have any lab test that is abnormal or we need to change your treatment, we will call you to review the results.  Testing/Procedures: None   Follow-Up: At [facility], you and your health needs are our priority.  As part of our continuing mission to provide you with exceptional heart care, we have created designated Provider Care Teams.  These Care Teams include your primary Cardiologist (physician) and Advanced Practice Providers (APPs -  Physician Assistants and Nurse Practitioners) who all work together to provide you with the care you need, when you need it. You will need a follow up appointment in:  6 months.  Please call our office 2 months in advance to schedule this appointment.  You may see Kristeen Miss, MD or Tereso Newcomer, PA-C   Any Other Special Instructions Will Be Listed Below (If Applicable). Check your blood pressure once daily for 2 weeks and send me the results.

## 2018-11-06 NOTE — Progress Notes (Signed)
Virtual Visit via Video Note   This visit type was conducted due to national recommendations for restrictions regarding the COVID-19 Pandemic (e.g. social distancing) in an effort to limit this patient's exposure and mitigate transmission in our community.  Due to his co-morbid illnesses, this patient is at least at moderate risk for complications without adequate follow up.  This format is felt to be most appropriate for this patient at this time.  All issues noted in this document were discussed and addressed.  A limited physical exam was performed with this format.  Please refer to the patient's chart for his consent to telehealth for [facility].   Evaluation Performed:  Follow-up visit  Date:  11/06/2018   ID:  Ian Hale, DOB 11/25/1934, MRN [id number]  Patient Location: Home Provider Location: Home  PCP:  Chilton GreathouseAvva, Ravisankar, MD  Cardiologist:  Kristeen MissPhilip Nahser, MD   Electrophysiologist:  None  Pulmonologist:  Kalman ShanMurali Ramaswamy, MD  Chief Complaint: Follow-up on CAD  History of Present Illness:    Ian Hale is a 84 y.o. male with coronary artery disease status post myocardial infarction followed by CABG by Dr. Laneta SimmersBartle in July 2007, carotid disease, hypertension, hyperlipidemia, idiopathic pulmonary fibrosis.  He was last seen by Dr. Elease HashimotoNahser in September 2019.  Today, is seen with the D[employer] video application.  He was unable to log on through MyChart.  He has been doing fairly well.  He question whether or not he could come off of metoprolol.  He has been on this since his myocardial infarction.  He has chronic shortness of breath related to pulmonary fibrosis.  He has not had any chest discomfort.  He denies syncope or near syncope.  He denies lower extremity swelling.  He did have some epistaxis as well as purpura.  He changed his aspirin to every other day with improved symptoms.  The patient does not have symptoms concerning for COVID-19 infection (fever, chills, cough, or new  shortness of breath).    Past Medical History:  Diagnosis Date  . Allergy   . BPH (benign prostatic hyperplasia)   . CAD (coronary artery disease)   . Cholesteatoma of left ear   . ED (erectile dysfunction)   . Glaucoma   . Hearing loss of both ears   . Hyperlipidemia   . Hypertension   . IPF (idiopathic pulmonary fibrosis) (HCC)   . Old MI (myocardial infarction) 2007   s/p CABG   Past Surgical History:  Procedure Laterality Date  . CLEFT PALATE REPAIR  at birth  . CORONARY ARTERY BYPASS GRAFT  01/2006     Current Meds  Medication Sig  . aspirin 81 MG tablet Take 81 mg by mouth every other day.   Marland Kitchen. atorvastatin (LIPITOR) 80 MG tablet TAKE 1 TABLET(80 MG) BY MOUTH DAILY  . bimatoprost (LUMIGAN) 0.01 % SOLN Place 1 drop into both eyes at bedtime.  . cyanocobalamin 2000 MCG tablet Take 2,000 mcg by mouth 2 (two) times daily.  . furosemide (LASIX) 20 MG tablet TAKE 1 TABLET(20 MG) BY MOUTH DAILY  . nitroGLYCERIN (NITROSTAT) 0.4 MG SL tablet Place 1 tablet (0.4 mg total) under the tongue every 5 (five) minutes as needed.  Marland Kitchen. OFEV 150 MG CAPS Take 1 capsule by mouth 2 (two) times daily.   . potassium chloride SA (K-DUR,KLOR-CON) 20 MEQ tablet Take 0.5 tablets (10 mEq total) by mouth daily.  . saw palmetto 160 MG capsule Take 160 mg by mouth daily.   . [  DISCONTINUED] metoprolol tartrate (LOPRESSOR) 25 MG tablet TAKE 1/2 TABLET(12.5 MG) BY MOUTH TWICE DAILY     Allergies:   Patient has no known allergies.   Social History   Tobacco Use  . Smoking status: Never Smoker  . Smokeless tobacco: Never Used  Substance Use Topics  . Alcohol use: No  . Drug use: No     Family Hx: The patient's family history includes Depression in his father; Diabetes in his father; Heart failure in his father and mother.  ROS:   Please see the history of present illness.    All other systems reviewed and are negative.   Prior CV studies:   The following studies were reviewed today:  Echo  11/01/17 Mild LVH, mod focal basal septal hypertrophy, EF 60-65, no RWMA, Gr 1 DD, trivial AI, Ao root 40, trivial MR  Labs/Other Tests and Data Reviewed:    High Resolution Chest CT IMPRESSION: 1. Progressively worsening interstitial lung disease which has a strong craniocaudal gradient and many imaging features of usual interstitial pneumonia (UIP), without frank honeycombing, and with the presence of moderate air trapping. Overall, this is classified as ATS probable UIP. Repeat high-resolution chest CT is suggested in 12 months to assess for further temporal changes in the appearance of the lung parenchyma. 2. Aortic atherosclerosis, in addition to left main and 3 vessel coronary artery disease. Status post median sternotomy for CABG including LIMA to the LAD. 3. Multiple new compression fractures at T2, T3 and T4, as above, which appear to be subacute injuries.  Aortic Atherosclerosis (ICD10-I70.0).  EKG:  An ECG dated 04/19/18 was personally reviewed today and demonstrated:  Normal sinus rhythm, heart rate 65, first-degree AV block, PR interval 252, QTc 413, PVC   Recent Labs: 03/16/2018: ALT 18   Recent Lipid Panel Lab Results  Component Value Date/Time   CHOL 120 (L) 01/01/2016 09:43 AM   TRIG 108 01/01/2016 09:43 AM   HDL 34 (L) 01/01/2016 09:43 AM   CHOLHDL 3.5 01/01/2016 09:43 AM   LDLCALC 64 01/01/2016 09:43 AM   From [facility] Tool    Wt Readings from Last 3 Encounters:  11/06/18 192 lb (87.1 kg)  07/03/18 200 lb 9.9 oz (91 kg)  06/05/18 205 lb 0.4 oz (93 kg)     Objective:    Vital Signs:  BP 122/64   Pulse 69   Ht 5\' 7"  (1.702 m)   Wt 192 lb (87.1 kg)   BMI 30.07 kg/m    GEN:  During our conversation, the patient does not seem to be in acute distress.  He is wearing O2 nasal cannula RESP:  Respiratory effort seems to be normal.    NEURO:  Alert and Oriented.    PSYCH:  The patient is in good spirits.     ASSESSMENT & PLAN:    Coronary artery  disease involving native coronary artery of native heart without angina pectoris History of myocardial infarction followed by CABG in 2007.  He is doing well without anginal symptoms.  Continue atorvastatin, aspirin.  He may continue on aspirin every other day.  Essential hypertension The patient's blood pressure is controlled on his current regimen.  He is concerned about continuing on metoprolol given his pulmonary fibrosis.  He has noted some slow heart rates at times.  I have asked him to taper off of metoprolol.  He will keep an eye on his blood pressure and send me some readings after 2 weeks.  If his blood pressure is  uncontrolled, consider amlodipine.  Mixed hyperlipidemia LDL optimal on most recent lab work.  Continue current Rx.    IPF (idiopathic pulmonary fibrosis) (HCC) Continue follow-up with pulmonology.  COVID-19 Education: The signs and symptoms of COVID-19 were discussed with the patient and how to seek care for testing (follow up with PCP or arrange E-visit).  The importance of social distancing was discussed today.  Time:   Today, I have spent 16 minutes with the patient with telehealth technology discussing the above problems.     Medication Adjustments/Labs and Tests Ordered: Current medicines are reviewed at length with the patient today.  Concerns regarding medicines are outlined above.   Tests Ordered: No orders of the defined types were placed in this encounter.   Medication Changes: No orders of the defined types were placed in this encounter.   Disposition:  Follow up in 6 month(s)  Signed, Tereso Newcomer, PA-C  11/06/2018 5:53 PM    [facility]

## 2018-11-07 ENCOUNTER — Encounter (INDEPENDENT_AMBULATORY_CARE_PROVIDER_SITE_OTHER): Payer: Medicare Other | Admitting: Adult Health

## 2018-11-07 ENCOUNTER — Encounter: Payer: Medicare Other | Admitting: *Deleted

## 2018-11-07 ENCOUNTER — Other Ambulatory Visit: Payer: Self-pay

## 2018-11-07 ENCOUNTER — Encounter: Payer: Medicare Other | Admitting: Internal Medicine

## 2018-11-07 DIAGNOSIS — Z006 Encounter for examination for normal comparison and control in clinical research program: Secondary | ICD-10-CM

## 2018-11-07 DIAGNOSIS — J84112 Idiopathic pulmonary fibrosis: Secondary | ICD-10-CM

## 2018-11-07 NOTE — Assessment & Plan Note (Signed)
Continue per research protocol .  PFT , labs and EKG pending per protocol .

## 2018-11-07 NOTE — Assessment & Plan Note (Signed)
Increased symptoms of decline with apparent deconditioning and increased perceived Oxygen demands.  PFT is pending per research protocol .  Previously failed esbriet w/ lung function decline.  Continue on OFEV.  Follow up on PFT .  Previous HRCT chest in 02/2018 showed progressive ILD , may need to consider repeat CT chest however due to COVID 19 precautions , will hold for now as will not change course of treatment

## 2018-11-07 NOTE — Research (Signed)
Title: GALACTIC-1 [facility]) is a Phase 2b, randomized, double-blind, parallel, placebo-controlled multicenter international study to evaluate evaluate the efficacy and safety of two doses (placebo v 3mg  v 10mg ) of TD139 administered once a day for 52 weeks as compared to placebo in subjects with Idiopathic Pulmonary Fibrosis. Key Primary End Point: Annual rate of decline in FVC expressed in mL over 52 weeks  Protocol #: GALACTIC-1, Clinical Trials #: [id number]. Sponsor: [email or web address], [place])  Astronomer / Radio producer note : This visit for Subject Ian Hale with DOB: 1935-06-27 on 11/07/2018 for the above protocol is Visit/Encounter # 6  and is for purpose of research . (Subject/LAR) expressed continued interest and consent in continuing as a study subject. Subject confirmed that there was  No change in contact information (e.g. address, telephone, email). Subject thanked for participation in research and contribution to science.   In this visit 11/07/2018 the subject will be evaluated by investigator named Rubye Oaks, NP  . This research coordinator has verified that the investigator is uptodate with his/her training logs. All procedures and assessments completed per above protocol. Refer to subject's paper source binder for further details of the study visit.    Signed by  Delman Cheadle, CCRC II Clinical Research Coordinator / Nurse [place], [place] 6:77 PM 11/07/2018

## 2018-11-07 NOTE — Patient Instructions (Signed)
Continue research protocol  

## 2018-11-07 NOTE — Progress Notes (Signed)
 @Patient  ID: Ian Hale, male    DOB: 10/11/1934, 84 y.o.   MRN: [id number]  Chief Complaint  Patient presents with  . Research    Visit 6 [employer]     Referring provider: Chilton GreathouseAvva, Ravisankar, MD  HPI: Maye HidesGALACTIC-1 [facility]([employer] Study) is a Phase 2b, randomized, double-blind, parallel, placebo-controlled multicenter international study to evaluate evaluate the efficacy and safety of two doses (placebo v 3mg  v 10mg ) of TD139 administered once a day for 52 weeks as compared to placebo in subjects with Idiopathic Pulmonary Fibrosis. Key Primary End Point: Annual rate of decline in FVC expressed in mL over 52 weeks  Protocol #: GALACTIC-1, Clinical Trials #: [id number]. Sponsor: [email or web address] ([place], [place])  Key Features of TD139 the study drug: a Galectin-3 inhibitor designed specifically to modulate the fibrogenic response to tissue injury. In multiple models of organ fibrosis, it has been demonstrated that Gal-3 is potently pro-fibrotic, modulating the activity of fibroblasts and macrophages in chronically injured tissues. It is hypothesized that increased Gal-3 expression during chronic injury leads to fibroblast activation via the aggregation of growth factor receptor clusters (such as TGF-), thereby amplifying profibrotic signal cascades.  11/07/2018 Research Study : Visit #6 [employer] -1  Subject is an 84 year old male followed for idiopathic pulmonary fibrosis on maintenance of OFEV 150mg  Twice daily  (started early June 2019, previously on Esbriet but changed due to lung function decline)   The above subject presents for research visit #6 he expresses continued interest in participating in the research protocol.  Patient feels that he has had a decline in his breathing over the last 3 months.  Says that he feels like he gets out of breath easier and does not recover as fast and has to use more oxygen with activity now.  At rest he feels that his breathing is the same however he is not  able to do as much activity as he was 3 months ago. He denies any cough.  Says he has some mild intermittent sinus drainage but this is chronic. Says he has some chronic intermittent diarrhea that has been controlled with diet and intermittent use of Imodium  Since last visit he has had his second cataract surgery done on his left eye.  Says it went well.   Informs us he had a follow-up virtual cardiology visit yesterday.  Has been instructed to taper off of metoprolol and has changed aspirin to every other day.  Patient says that home checks of heart rate do intermittently go below 50 and therefore his cardiology is tapering him off of metoprolol   He says he is under a lot of stress.  Wife remains on hospice and has not been doing as well she is 7999.     No Known Allergies  Immunization History  Administered Date(s) Administered  . Influenza Whole 07/31/2007  . Influenza, High Dose Seasonal PF 09/22/2016, 05/18/2017  . Influenza-Unspecified 06/24/2013, 05/14/2018  . Pneumococcal Conjugate-13 10/29/2013  . Pneumococcal Polysaccharide-23 07/25/2001, 07/31/2007  . Td 07/25/2001  . Tdap 10/04/2011  . Zoster 07/31/2008    Past Medical History:  Diagnosis Date  . Allergy   . BPH (benign prostatic hyperplasia)   . CAD (coronary artery disease)   . Cholesteatoma of left ear   . ED (erectile dysfunction)   . Glaucoma   . Hearing loss of both ears   . Hyperlipidemia   . Hypertension   . IPF (idiopathic pulmonary fibrosis) (HCC)   . Old MI (myocardial infarction) 2007  s/p CABG    Tobacco History: Social History   Tobacco Use  Smoking Status Never Smoker  Smokeless Tobacco Never Used   Counseling given: Not Answered   Outpatient Medications Prior to Visit  Medication Sig Dispense Refill  . aspirin 81 MG tablet Take 81 mg by mouth every other day.     Marland Kitchen atorvastatin (LIPITOR) 80 MG tablet TAKE 1 TABLET(80 MG) BY MOUTH DAILY 90 tablet 2  . bimatoprost (LUMIGAN) 0.01 % SOLN  Place 1 drop into both eyes at bedtime.    . cyanocobalamin 2000 MCG tablet Take 2,000 mcg by mouth 2 (two) times daily.    . furosemide (LASIX) 20 MG tablet TAKE 1 TABLET(20 MG) BY MOUTH DAILY 90 tablet 3  . nitroGLYCERIN (NITROSTAT) 0.4 MG SL tablet Place 1 tablet (0.4 mg total) under the tongue every 5 (five) minutes as needed. 25 tablet 1  . OFEV 150 MG CAPS Take 1 capsule by mouth 2 (two) times daily.     . potassium chloride SA (K-DUR,KLOR-CON) 20 MEQ tablet Take 0.5 tablets (10 mEq total) by mouth daily. 45 tablet 2  . saw palmetto 160 MG capsule Take 160 mg by mouth daily.      No facility-administered medications prior to visit.      Review of Systems:  See HPI    Physical Exam   GEN: A/Ox3; pleasant , NAD, well nourished , elderly , frail on Oxygen    HEENT:  [place]/AT,   NOSE-clear, THROAT-clear, no lesions, no postnasal drip or exudate noted.   NECK:  Supple w/ fair ROM; no JVD; normal carotid impulses w/o bruits;    RESP  Faint bibasilar crackles , no dullness to percussion  CARD:  RRR, no m/r/g, tr to 1 +  peripheral edema, pulses intact, no cyanosis or clubbing.  GI:   Soft & nt; nml bowel sounds; no organomegaly or masses detected.   Musco: Warm bil, no deformities or joint swelling noted.   Neuro: alert, no focal deficits noted.    Skin: Warm, no lesions or rashes     PFT Results Latest Ref Rng & Units 04/11/2018 03/16/2018 12/05/2017 09/12/2017 08/14/2017 04/28/2017  FVC-Pre L - 2.29 2.73 2.91 2.96 3.25  FVC-Predicted Pre % - 64 74 79 80 88  FVC-Post L - - - - - 3.25  FVC-Predicted Post % - - - - - 88  Pre FEV1/FVC % % - 81 80 78 84 78  Post FEV1/FCV % % - - - - - 77  FEV1-Pre L - 1.85 2.17 2.28 2.48 2.54  FEV1-Predicted Pre % - 74 84 88 95 98  FEV1-Post L - - - - - 2.50  DLCO UNC% % 37 43 48 45 45 50  DLCO COR %Predicted % 71 84 83 70 68 78  TLC L - - - - - 6.31  TLC % Predicted % - - - - - 94  RV % Predicted % - - - - - 119    No results found for:  NITRICOXIDE      Assessment & Plan:   IPF (idiopathic pulmonary fibrosis) (HCC) Increased symptoms of decline with apparent deconditioning and increased perceived Oxygen demands.  PFT is pending per research protocol .  Previously failed esbriet w/ lung function decline.  Continue on OFEV.  Follow up on PFT .  Previous HRCT chest in 02/2018 showed progressive ILD , may need to consider repeat CT chest however due to COVID 19 precautions , will hold  for now as will not change course of treatment     Research study patient Continue per research protocol .  PFT , labs and EKG pending per protocol .       Rubye Oaks, NP 11/07/2018

## 2018-11-07 NOTE — Research (Signed)
Title: GALACTIC-1 [facility]) is a Phase 2b, randomized, double-blind, parallel, placebo-controlled multicenter international study to evaluate evaluate the efficacy and safety of two doses (placebo v 3mg  v 10mg ) of TD139 administered once a day for 52 weeks as compared to placebo in subjects with Idiopathic Pulmonary Fibrosis. Key Primary End Point: Annual rate of decline in FVC expressed in mL over 52 weeks  Protocol #: GALACTIC-1, Clinical Trials #: [id number]. Sponsor: [email or web address] ([place], [place])

## 2018-11-20 ENCOUNTER — Other Ambulatory Visit: Payer: Self-pay | Admitting: *Deleted

## 2018-11-20 MED ORDER — OFEV 150 MG PO CAPS: 150.0000 mg | ORAL_CAPSULE | Freq: Two times a day (BID) | ORAL | 11 refills | Status: AC

## 2018-12-17 ENCOUNTER — Other Ambulatory Visit: Payer: Self-pay | Admitting: Cardiovascular Disease

## 2019-01-23 DEATH — deceased

## 2019-02-06 ENCOUNTER — Telehealth: Payer: Self-pay | Admitting: Internal Medicine

## 2019-02-06 NOTE — Telephone Encounter (Signed)
LMTCB at patient number to offer condolences.   Call returned to [place] with BI, she states she spoke with the patient son and he reports his father passed away. She states the agents at the [facility] are not able to ask the family what may have happened and how he may have passed but they did want to let us know he passed June 20th. I made her aware I would let his provider know. She is requesting a call back to know whether it was from complications from ofev or something else.

## 2019-02-06 NOTE — Telephone Encounter (Signed)
LMTCB with patient.   Attempted to contact Lattie Haw with BI at number provided, recording states they no longer have voice-mails due to trying to improve communication. Will leave in triage and attempt to call again later.

## 2019-02-07 ENCOUNTER — Encounter: Payer: Medicare Other | Admitting: Adult Health

## 2019-02-07 NOTE — Telephone Encounter (Signed)
Sorry to hear about Ian Hale passing away.  Recently his pulmonary fibrosis is been worsening.  Most patients with pulmonary fibrosis died from pulmonary fibrosis although his sudden death following his son's visit makes me suspect he might of had a blood clot in his lung overnight sudden heart attack which are also possible given his age and medical issues.  In this case it would be unrelated to pulmonary fibrosis or nintedanib or the research study drug.  There is my best guess   Please prepare a condolence card SIGNATURE    Dr. Brand Males, M.D., [name],  Pulmonary and Critical Care Medicine Staff Physician, [place] Director - Interstitial Lung Disease  Program  [facility] [place] [facility] [place], [place], [address]  Pager: [phone], If no answer or between  15:00h - 7:00h: call [phone] Telephone: [phone]  7:15 PM 02/07/2019

## 2019-02-07 NOTE — Telephone Encounter (Signed)
Attempted to call the mobile number and home number that we have listed for pt but unable to reach and unable to leave a VM.  Routing to MR as an Pharmacist, hospital.

## 2019-11-19 ENCOUNTER — Telehealth: Payer: Self-pay | Admitting: Internal Medicine

## 2019-11-19 NOTE — Telephone Encounter (Signed)
   Approached by  Study coordinator - Gean Birchwood -about causes of death.  This is because of serious adverse event reporting form for the recent study patient was on.  Patient died suddenly at home.  It was unwitnessed.  Etiology as described before could have been a myocardial infarction of pulmonary embolism.  Alternatively could have been electrical disturbances or electrolyte disturbances.  Therefore the best time to encompass the sudden death in the absence of witness in the absence of autopsy is the term sudden cardiac death   We will reported as such to the study sponsor     SIGNATURE    Dr. Kalman Shan, M.D., [name], [name] Pulmonary and Critical Care Medicine Research Investigator, [facility] @ [facility] Health Staff Physician, [facility] Director - Interstitial Lung Disease  Program  Pulmonary Fibrosis [facility] - [place] [facility] and [facility] @ [facility] [place], [place], [address]  Pager: [phone], If no answer or between  15:00h - 7:00h: call [phone] Telephone: [phone]  2:50 PM 11/19/2019

## 2019-12-03 IMAGING — CT CT CHEST HIGH RESOLUTION W/O CM
3 of 8 series · 15 of 36 positions shown, 16 images · non-contrast
Comparison: High-resolution chest CT 04/12/2017.

CLINICAL DATA: 83-year-old male with history of restrictive lung
disease. Evaluate for interstitial lung disease.

EXAM:
CT CHEST WITHOUT CONTRAST
TECHNIQUE: Multidetector CT imaging of the chest was performed following the
standard protocol without intravenous contrast. High resolution
imaging of the lungs, as well as inspiratory and expiratory imaging,
was performed.

[Series 2: high resolution · axial · 0.77mm/px · z∈[-255,-73]mm · 4 of 153 slices shown, 5 images]
[im 31/153  mediastinal]
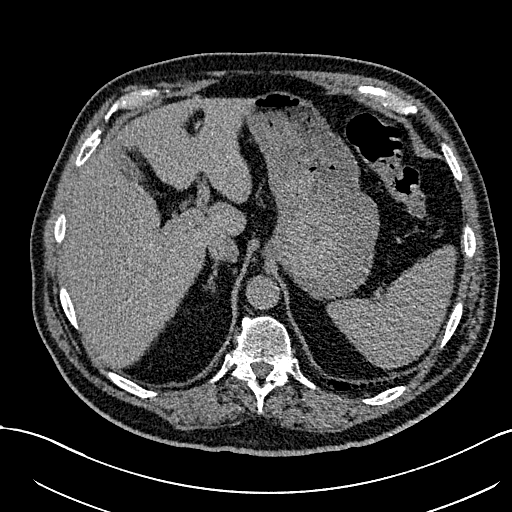
[im 31/153  lung]
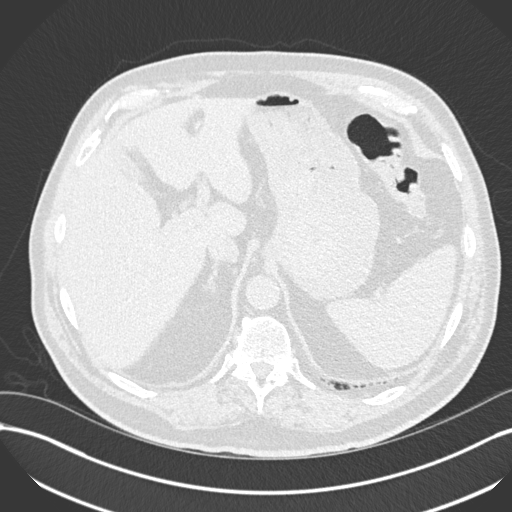
[im 61/153  lung]
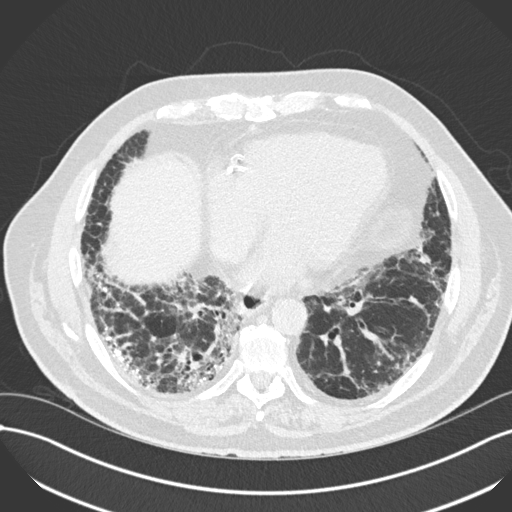
[im 92/153  lung]
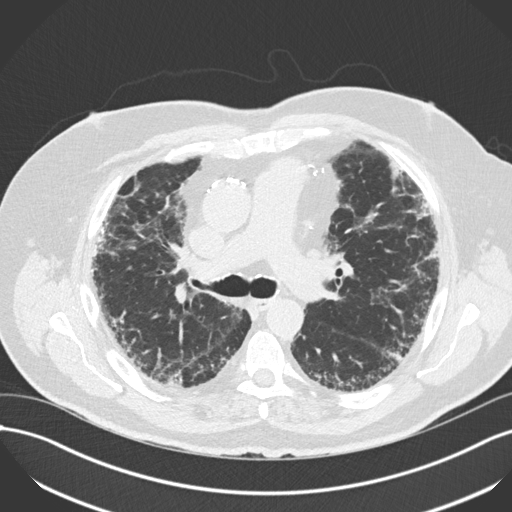
[im 122/153  lung]
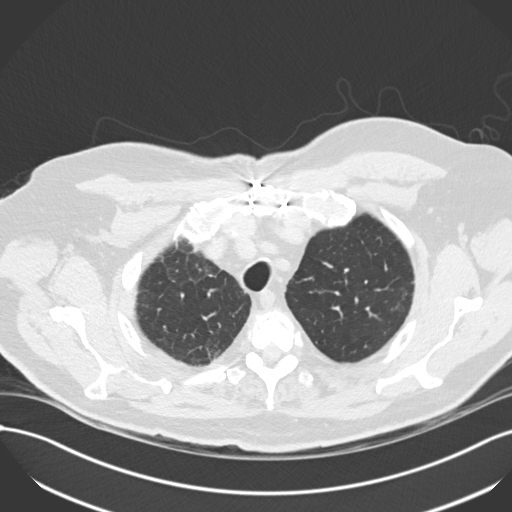

[Series 14: high resolution 1.0 i30f 2 · axial · 0.77mm/px · z∈[-271,-35]mm · 8 of 284 slices shown]
[im 24/284  lung]
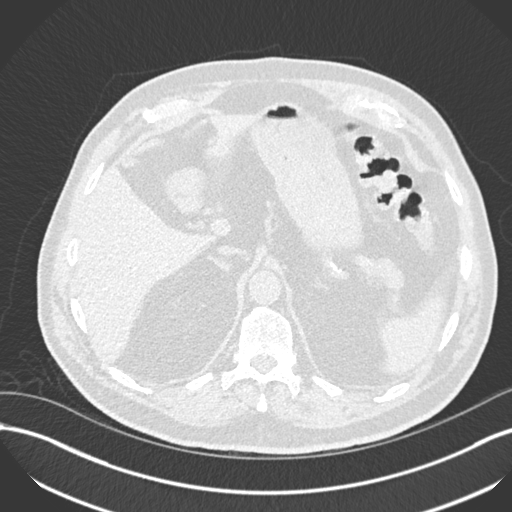
[im 71/284  lung]
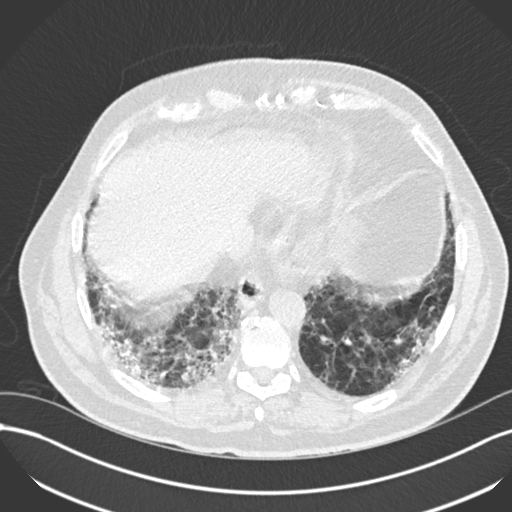
[im 95/284  lung]
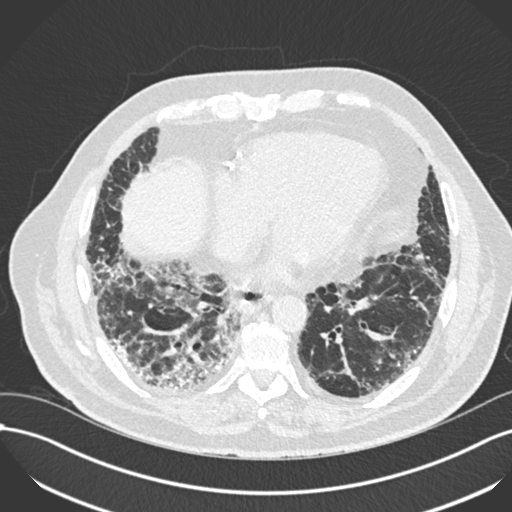
[im 118/284  lung]
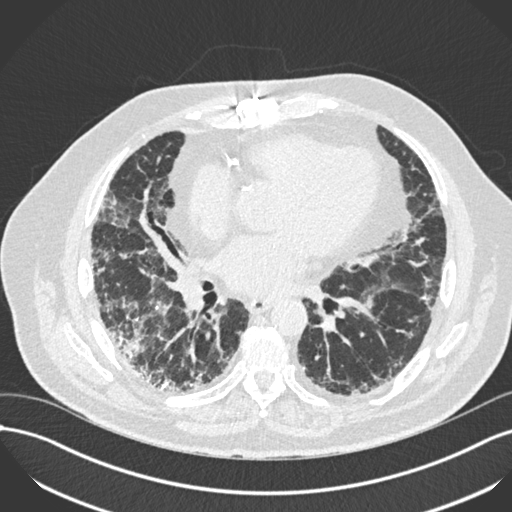
[im 166/284  lung]
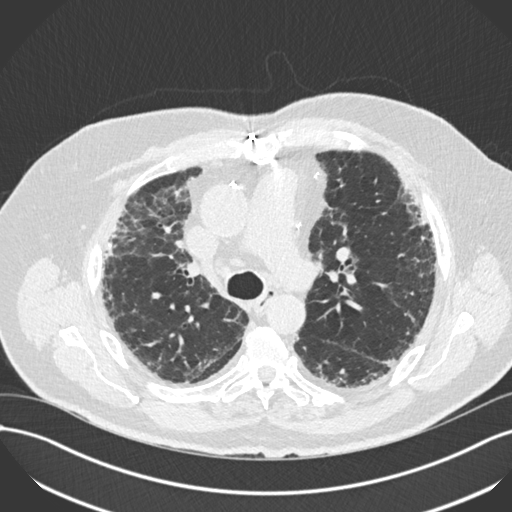
[im 189/284  lung]
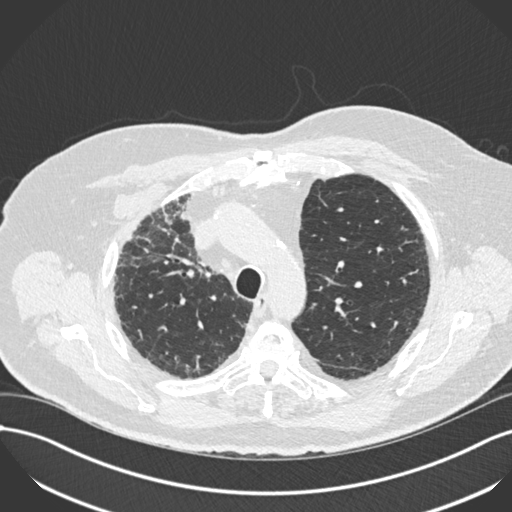
[im 213/284  lung]
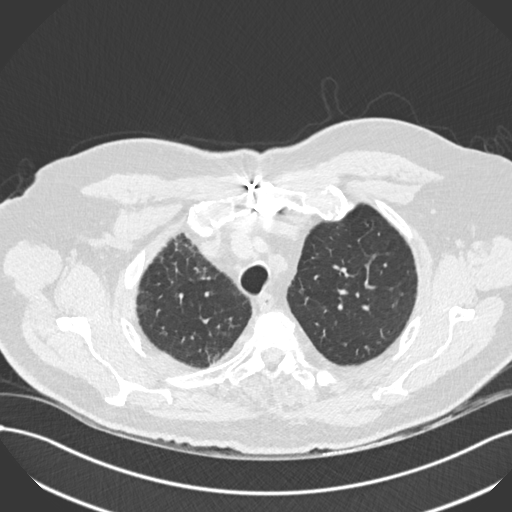
[im 260/284  lung]
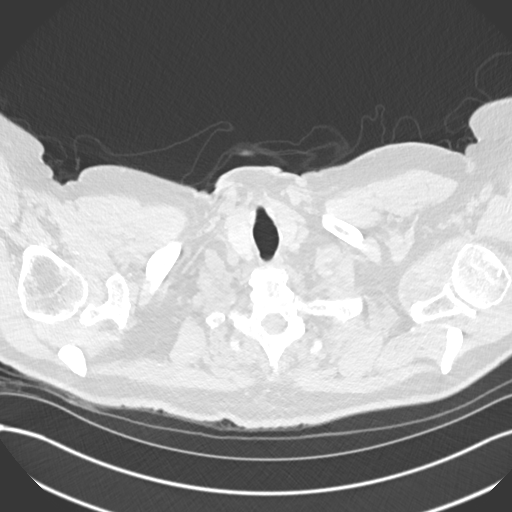

[Series 17: coronal · coronal · 0.62mm/px · 3 of 132 slices shown]
[im 27/132  lung]
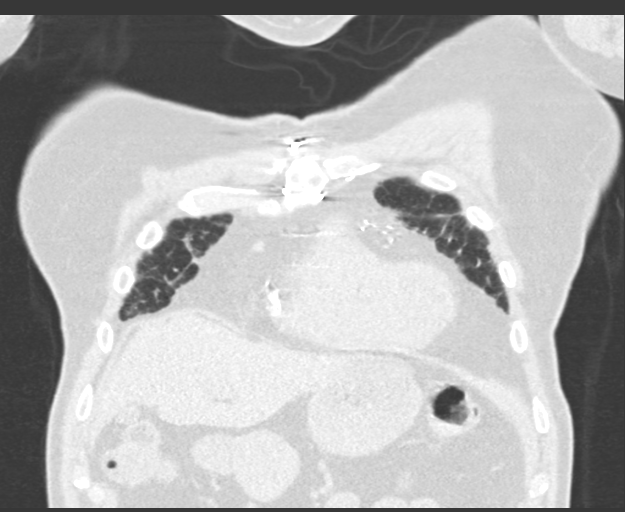
[im 53/132  lung]
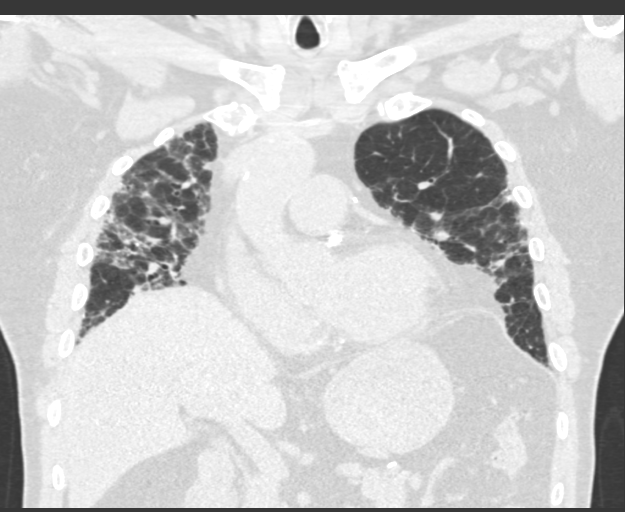
[im 79/132  lung]
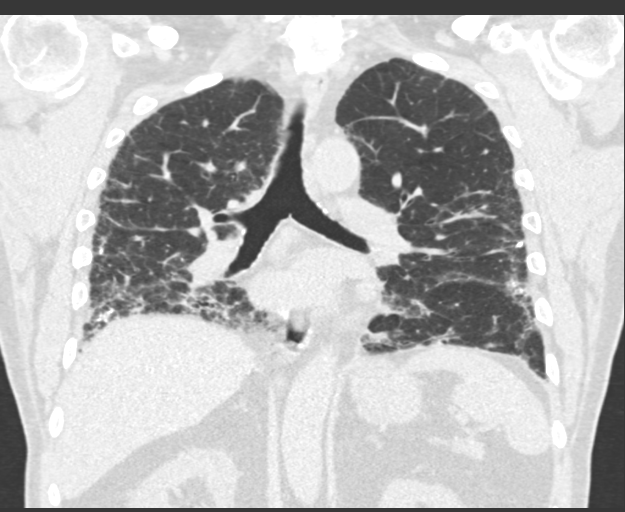

[15 of 36 positions shown; findings below may reference images not displayed]

FINDINGS: Cardiovascular: Heart size is normal. There is no significant
pericardial fluid, thickening or pericardial calcification. There is
aortic atherosclerosis, as well as atherosclerosis of the great
vessels of the mediastinum and the coronary arteries, including
calcified atherosclerotic plaque in the left main, left anterior
descending, left circumflex and right coronary arteries. Status post
median sternotomy for CABG including [REDACTED] to the LAD.

Mediastinum/Nodes: No pathologically enlarged mediastinal or hilar
lymph nodes. Please note that accurate exclusion of hilar adenopathy
is limited on noncontrast CT scans. Esophagus is unremarkable in
appearance. No axillary lymphadenopathy.

Lungs/Pleura: High-resolution images demonstrate widespread areas of
patchy ground-glass attenuation, subpleural reticulation, septal
thickening, thickening of the peribronchovascular interstitium, mild
cylindrical bronchiectasis and peripheral bronchiolectasis. These
findings have a mild craniocaudal gradient and appear slightly
progressive compared to prior study from 04/12/2017. Although not
yet definitive, there is a suggestion of developing honeycombing in
the lung bases, particularly in the right lower lobe. Inspiratory
and expiratory imaging demonstrates some moderate air trapping,
indicative of small airways disease. No acute consolidative airspace
disease. No pleural effusions. Numerous tiny calcified granulomas
are noted, most evident in the lower lobes of the lungs bilaterally.
No definite suspicious appearing pulmonary nodules or masses are
noted.

Upper Abdomen: Aortic atherosclerosis.

Musculoskeletal: There are new compression fractures of superior
endplates of T2, T3 and T4, with some visualization of the fracture
lines at these levels, suggesting subacute injuries. No definite
paravertebral soft tissue swelling is identified at this time to
suggest acute injury. Findings are most severe at T2 where there is
30% loss of anterior vertebral body height. There are no aggressive
appearing lytic or blastic lesions noted in the visualized portions
of the skeleton.
IMPRESSION: 1. Progressively worsening interstitial lung disease which has a
strong craniocaudal gradient and many imaging features of usual
interstitial pneumonia (UIP), without frank honeycombing, and with
the presence of moderate air trapping. Overall, this is classified
as ATS probable UIP. Repeat high-resolution chest CT is suggested in
12 months to assess for further temporal changes in the appearance
of the lung parenchyma.
2. Aortic atherosclerosis, in addition to left main and 3 vessel
coronary artery disease. Status post median sternotomy for CABG
including [REDACTED] to the LAD.
3. Multiple new compression fractures at T2, T3 and T4, as above,
which appear to be subacute injuries.

Aortic Atherosclerosis ([id number].S).
